# Patient Record
Sex: Male | Born: 1950
Health system: Southern US, Community
[De-identification: ages and names within clinical notes are randomized; demographics above are authoritative.]

## PROBLEM LIST (undated history)

## (undated) DIAGNOSIS — J302 Other seasonal allergic rhinitis: Secondary | ICD-10-CM

## (undated) DIAGNOSIS — E7800 Pure hypercholesterolemia, unspecified: Secondary | ICD-10-CM

## (undated) DIAGNOSIS — M1990 Unspecified osteoarthritis, unspecified site: Secondary | ICD-10-CM

## (undated) DIAGNOSIS — E785 Hyperlipidemia, unspecified: Secondary | ICD-10-CM

## (undated) DIAGNOSIS — R972 Elevated prostate specific antigen [PSA]: Secondary | ICD-10-CM

## (undated) DIAGNOSIS — H3320 Serous retinal detachment, unspecified eye: Secondary | ICD-10-CM

## (undated) DIAGNOSIS — C61 Malignant neoplasm of prostate: Secondary | ICD-10-CM

## (undated) HISTORY — DX: Serous retinal detachment, unspecified eye: H33.20

## (undated) HISTORY — DX: Hyperlipidemia, unspecified: E78.5

## (undated) HISTORY — PX: PROSTATE SURGERY: SHX751

## (undated) HISTORY — PX: TONSILLECTOMY: SUR1361

## (undated) HISTORY — PX: TOOTH EXTRACTION: SHX859

## (undated) HISTORY — DX: Elevated prostate specific antigen (PSA): R97.20

## (undated) HISTORY — PX: NASAL SEPTOPLASTY W/ TURBINOPLASTY: SHX2070

## (undated) HISTORY — PX: GANGLION CYST EXCISION: SHX1691

## (undated) HISTORY — PX: KNEE ARTHROSCOPY: SUR90

## (undated) HISTORY — PX: VASECTOMY: SHX75

---

## 2009-01-12 ENCOUNTER — Encounter (INDEPENDENT_AMBULATORY_CARE_PROVIDER_SITE_OTHER): Payer: Self-pay | Admitting: General Surgery

## 2009-01-12 ENCOUNTER — Ambulatory Visit (HOSPITAL_COMMUNITY): Admission: RE | Admit: 2009-01-12 | Discharge: 2009-01-12 | Payer: Self-pay | Admitting: General Surgery

## 2009-04-24 ENCOUNTER — Encounter: Admission: RE | Admit: 2009-04-24 | Discharge: 2009-05-29 | Payer: Self-pay | Admitting: Pediatrics

## 2011-02-26 NOTE — H&P (Signed)
NAME:  Dwayne Young, Dwayne Young               ACCOUNT NO.:  [id number]   MEDICAL RECORD NO.:  [id number]          PATIENT TYPE:  AMB   LOCATION:  DAY                           FACILITY:  [facility]   PHYSICIAN:  Dalia Heading, M.D.  DATE OF BIRTH:  [birth year]   DATE OF ADMISSION:  DATE OF DISCHARGE:  LH                              HISTORY & PHYSICAL   CHIEF COMPLAINT:  History of colon polyps, epigastric pain.   HISTORY OF PRESENT ILLNESS:  The patient is a 60 year old white male who  is referred for endoscopic evaluation.  He needs colonoscopy for  followup of colon polyps and an EGD for epigastric pain.  He last had  colonoscopy over 3 years ago in [place].  He states he has had  intermittent left upper quadrant abdominal pain, bloating for the last 6  months.  He was tried on a brand of antibiotics for H. pylori, but they  did not work.  No weight loss, nausea, vomiting, diarrhea, constipation,  melena, and hematochezia have been noted.  There is no family history of  colon carcinoma.   PAST MEDICAL HISTORY:  1. Hypertension.  2. Extrinsic allergies.  3. Bone pain.   PAST SURGICAL HISTORY:  1. Multiple knee surgeries.  2. Ganglion cyst removal on the wrist.  3. Colonoscopies.   CURRENT MEDICATIONS:  Baby aspirin, simvastatin, multivitamin,  hydrochlorothiazide, Zyrtec, and diclofenac.   ALLERGIES:  No known drug allergies.   REVIEW OF SYSTEMS:  Noncontributory.   PHYSICAL EXAMINATION:  GENERAL:  The patient is a well-developed, well-  nourished white male in no acute distress.  LUNGS:  Clear to auscultation with equal breath sounds bilaterally.  HEART:  Regular rate and rhythm without S3, S4, murmurs.  ABDOMEN:  Soft, nontender, nondistended.  No hepatosplenomegaly or  masses are noted.  RECTAL:  Deferred to the procedure.   IMPRESSION:  History of colon polyps, epigastric pain.   PLAN:  The patient is scheduled for an EGD and colonoscopy on January 12, 2009.  The risks and  benefits of the procedure including bleeding and  perforation were fully explained to the patient, gave informed consent.      Dalia Heading, M.D.  Electronically Signed     MAJ/MEDQ  D:  12/19/2008  T:  12/19/2008  Job:  [id number]   cc:   Francoise Schaumann. Milford Cage DO, FAAP  Fax: [phone]

## 2013-02-01 ENCOUNTER — Ambulatory Visit (INDEPENDENT_AMBULATORY_CARE_PROVIDER_SITE_OTHER): Payer: BC Managed Care – PPO | Admitting: Family Medicine

## 2013-02-01 ENCOUNTER — Encounter: Payer: Self-pay | Admitting: Family Medicine

## 2013-02-01 VITALS — BP 110/70 | HR 74 | Temp 98.3°F | Resp 16 | Wt 202.0 lb

## 2013-02-01 DIAGNOSIS — H3320 Serous retinal detachment, unspecified eye: Secondary | ICD-10-CM | POA: Insufficient documentation

## 2013-02-01 DIAGNOSIS — E785 Hyperlipidemia, unspecified: Secondary | ICD-10-CM | POA: Insufficient documentation

## 2013-02-01 DIAGNOSIS — R972 Elevated prostate specific antigen [PSA]: Secondary | ICD-10-CM | POA: Insufficient documentation

## 2013-02-01 LAB — HEPATIC FUNCTION PANEL
ALT: 15 U/L (ref 0–53)
AST: 20 U/L (ref 0–37)
Alkaline Phosphatase: 67 U/L (ref 39–117)
Bilirubin, Direct: 0.1 mg/dL (ref 0.0–0.3)
Total Bilirubin: 0.6 mg/dL (ref 0.3–1.2)

## 2013-02-01 LAB — LIPID PANEL
LDL Cholesterol: 101 mg/dL — ABNORMAL HIGH (ref 0–99)
Total CHOL/HDL Ratio: 3.6 Ratio
VLDL: 25 mg/dL (ref 0–40)

## 2013-02-01 LAB — BASIC METABOLIC PANEL
BUN: 26 mg/dL — ABNORMAL HIGH (ref 6–23)
Chloride: 106 mEq/L (ref 96–112)
Potassium: 4.8 mEq/L (ref 3.5–5.3)
Sodium: 141 mEq/L (ref 135–145)

## 2013-02-01 MED ORDER — LORAZEPAM 0.5 MG PO TABS: 0.5000 mg | ORAL_TABLET | Freq: Every evening | ORAL | Status: DC | PRN

## 2013-02-01 NOTE — Progress Notes (Signed)
  Subjective:    Patient ID: Dwayne Young, male    DOB: 1951/01/18, 62 y.o.   MRN: [id number]  HPI At last office visit the patient was found to have an increase in his PSA from 1.27-3.07, with a free PSA of 13. Urology was consulted and performed a biopsy although the results have not returned yet.  Patient's LDL cholesterol was found to be elevated at 145. We increased his pravastatin to 40 mg by mouth daily. He denies any myalgias, or right upper quadrant pain. He is here today to recheck his fasting lipid panel. He denies any side effects of medication. Of note he is traveling to [place] and within the week. We did discuss malaria prophylaxis which he declines at the present time. Past Medical History  Diagnosis Date  . Retinal detachment   . Hyperlipidemia   . Elevated PSA    Current outpatient prescriptions:aspirin 81 MG tablet, Take 81 mg by mouth daily., Disp: , Rfl: ;  Azelastine-Fluticasone (DYMISTA) 137-50 MCG/ACT SUSP, Place into the nose., Disp: , Rfl: ;  cetirizine (ZYRTEC) 10 MG tablet, Take 10 mg by mouth daily., Disp: , Rfl: ;  diclofenac (VOLTAREN) 75 MG EC tablet, Take 75 mg by mouth 2 (two) times daily., Disp: , Rfl: ;  montelukast (SINGULAIR) 10 MG tablet, Take 10 mg by mouth at bedtime., Disp: , Rfl:  pravastatin (PRAVACHOL) 20 MG tablet, Take 20 mg by mouth at bedtime., Disp: , Rfl: ;  tamsulosin (FLOMAX) 0.4 MG CAPS, Take 0.4 mg by mouth., Disp: , Rfl: ;  LORazepam (ATIVAN) 0.5 MG tablet, Take 1 tablet (0.5 mg total) by mouth at bedtime as needed., Disp: 30 tablet, Rfl: 1     Review of Systems    review of systems is otherwise negative Objective:   Physical Exam  Constitutional: He appears well-developed and well-nourished.  Cardiovascular: Normal rate, regular rhythm, normal heart sounds and intact distal pulses.   No murmur heard. Pulmonary/Chest: Effort normal and breath sounds normal. No respiratory distress. He has no wheezes. He has no rales.  Abdominal: Soft.  Bowel sounds are normal. He exhibits no distension. There is no tenderness. There is no rebound.          Assessment & Plan:  1. HLD (hyperlipidemia) Patient is tolerating the medicine fine at the higher dose. We will check a fasting lipid panel today. His goal LDL is less than 130.  If this is achieved we will continue pravastatin 40 mg by mouth daily - Basic Metabolic Panel - Hepatic Function Panel - Lipid Panel

## 2013-02-15 ENCOUNTER — Telehealth: Payer: Self-pay | Admitting: Family Medicine

## 2013-02-15 MED ORDER — PRAVASTATIN SODIUM 20 MG PO TABS: 40.0000 mg | ORAL_TABLET | Freq: Every day | ORAL | Status: DC

## 2013-02-15 NOTE — Telephone Encounter (Signed)
Rx Refilled  

## 2013-03-18 ENCOUNTER — Telehealth: Payer: Self-pay | Admitting: Family Medicine

## 2013-03-18 MED ORDER — PRAVASTATIN SODIUM 20 MG PO TABS: 40.0000 mg | ORAL_TABLET | Freq: Every day | ORAL | Status: DC

## 2013-03-18 NOTE — Telephone Encounter (Signed)
Medication refilled per protocol. 

## 2013-04-06 NOTE — H&P (Signed)
  NTS SOAP Note  Vital Signs:  Vitals as of: 03/11/2013: Systolic 145: Diastolic 80: Heart Rate 62: Temp 28F: Height 41ft 3in: Weight 204Lbs 0 Ounces: BMI 25.5  BMI : 25.5 kg/m2  Subjective: This 62 Years 29 Months old Male presents for follow up colonoscopy.  Last had a colonoscopy in 2010, polyp found.  No lower gi complaints.  No immediate family h/o colon cancer.  Recently diagnosed with prostate cancer.   Review of Symptoms:  Constitutional:unremarkable   Head:unremarkable    Eyes:unremarkable   Nose/Mouth/Throat:unremarkable Cardiovascular:  unremarkable   Respiratory:unremarkable   Gastrointestinal:  unremarkable   Genitourinary:unremarkable     Skin:unremarkable Hematolgic/Lymphatic:unremarkable     Allergic/Immunologic:unremarkable     Past Medical History:    Reviewed   Past Medical History  Surgical History: knee surgeries, ganglion cyst, TCS 2010 Medical Problems: prostate cancer, colon polyp, high cholesterol Allergies: nkda Medications: zyrtec, diclofenac, simvastatin   Social History:Reviewed  Social History  Preferred Language: English Race:  White Ethnicity: Not Hispanic / Latino Age: 62 Years 11 Months Marital Status:  M Alcohol: socially Recreational drug(s):  No   Smoking Status: Never smoker reviewed on 03/11/2013 Functional Status reviewed on mm/dd/yyyy ------------------------------------------------ Bathing: Normal Cooking: Normal Dressing: Normal Driving: Normal Eating: Normal Managing Meds: Normal Oral Care: Normal Shopping: Normal Toileting: Normal Transferring: Normal Walking: Normal Cognitive Status reviewed on mm/dd/yyyy ------------------------------------------------ Attention: Normal Decision Making: Normal Language: Normal Memory: Normal Motor: Normal Perception: Normal Problem Solving: Normal Visual and Spatial: Normal   Family History:  Reviewed  Family Health  History Mother, Unknown; Healthy;  Father, Unknown; Healthy;     Objective Information: General:  Well appearing, well nourished in no distress. Neck:  Supple without lymphadenopathy.  Heart:  RRR, no murmur Abdomen:Soft, NT/ND, no HSM, no masses.   deferred to procedure  Assessment:Colon polyp  Diagnosis &amp; Procedure Smart Code   Plan:Scheduled for TCS on 04/27/13.   Patient Education:Alternative treatments to surgery were discussed with patient (and family).  Risks and benefits  of procedure were fully explained to the patient (and family) who gave informed consent. Patient/family questions were addressed.  Follow-up:Pending Test Results

## 2013-04-19 ENCOUNTER — Encounter (HOSPITAL_COMMUNITY): Payer: Self-pay | Admitting: Pharmacy Technician

## 2013-04-23 ENCOUNTER — Other Ambulatory Visit: Payer: Self-pay | Admitting: Physician Assistant

## 2013-04-23 NOTE — Telephone Encounter (Signed)
Medication refilled per protocol. 

## 2013-04-27 ENCOUNTER — Ambulatory Visit (HOSPITAL_COMMUNITY)
Admission: RE | Admit: 2013-04-27 | Discharge: 2013-04-27 | Disposition: A | Discharge status: Home / Self Care | Payer: BC Managed Care – PPO | Source: Ambulatory Visit | Attending: General Surgery | Admitting: General Surgery

## 2013-04-27 ENCOUNTER — Encounter (HOSPITAL_COMMUNITY): Payer: Self-pay

## 2013-04-27 ENCOUNTER — Encounter (HOSPITAL_COMMUNITY)
Admission: RE | Disposition: A | Discharge status: Home / Self Care | Payer: Self-pay | Source: Ambulatory Visit | Attending: General Surgery

## 2013-04-27 DIAGNOSIS — Z09 Encounter for follow-up examination after completed treatment for conditions other than malignant neoplasm: Secondary | ICD-10-CM | POA: Insufficient documentation

## 2013-04-27 DIAGNOSIS — Z860100 Personal history of colon polyps, unspecified: Secondary | ICD-10-CM | POA: Insufficient documentation

## 2013-04-27 HISTORY — PX: COLONOSCOPY: SHX5424

## 2013-04-27 HISTORY — DX: Unspecified osteoarthritis, unspecified site: M19.90

## 2013-04-27 HISTORY — DX: Pure hypercholesterolemia, unspecified: E78.00

## 2013-04-27 HISTORY — DX: Malignant neoplasm of prostate: C61

## 2013-04-27 HISTORY — DX: Other seasonal allergic rhinitis: J30.2

## 2013-04-27 SURGERY — COLONOSCOPY
Anesthesia: Moderate Sedation

## 2013-04-27 MED ORDER — ATROPINE SULFATE 1 MG/ML IJ SOLN
INTRAMUSCULAR | Status: AC
  Filled 2013-04-27: qty 1

## 2013-04-27 MED ORDER — ATROPINE SULFATE 0.1 MG/ML IJ SOLN
INTRAMUSCULAR | Status: DC | PRN
  Administered 2013-04-27: 0.5 mg via INTRAVENOUS

## 2013-04-27 MED ORDER — MEPERIDINE HCL 50 MG/ML IJ SOLN
INTRAMUSCULAR | Status: AC
  Filled 2013-04-27: qty 1

## 2013-04-27 MED ORDER — FENTANYL CITRATE 0.05 MG/ML IJ SOLN
INTRAMUSCULAR | Status: AC
  Filled 2013-04-27: qty 2

## 2013-04-27 MED ORDER — MIDAZOLAM HCL 5 MG/5ML IJ SOLN
INTRAMUSCULAR | Status: DC | PRN
  Administered 2013-04-27: 2 mg via INTRAVENOUS
  Administered 2013-04-27: 1 mg via INTRAVENOUS

## 2013-04-27 MED ORDER — STERILE WATER FOR IRRIGATION IR SOLN
Status: DC | PRN
  Administered 2013-04-27: 08:00:00

## 2013-04-27 MED ORDER — FENTANYL CITRATE 0.05 MG/ML IJ SOLN
INTRAMUSCULAR | Status: DC | PRN
  Administered 2013-04-27: 50 ug via INTRAVENOUS

## 2013-04-27 MED ORDER — SODIUM CHLORIDE 0.9 % IV SOLN
INTRAVENOUS | Status: DC
  Administered 2013-04-27: 08:00:00 via INTRAVENOUS

## 2013-04-27 MED ORDER — MIDAZOLAM HCL 5 MG/5ML IJ SOLN
INTRAMUSCULAR | Status: AC
  Filled 2013-04-27: qty 10

## 2013-04-27 NOTE — Op Note (Addendum)
[facility] [address] [place], [address]   COLONOSCOPY PROCEDURE REPORT  PATIENT: Dwayne Young, Dwayne Young  MR#: [id number] BIRTHDATE: 1951-09-22 , 62  yrs. old GENDER: Male ENDOSCOPIST: Franky Macho, MD REFERRED [id number], Broadus John PROCEDURE DATE:  04/27/2013 PROCEDURE:   Colonoscopy, surveillance ASA CLASS:   Class II INDICATIONS:Patient's personal history of colon polyps. MEDICATIONS: Versed 4 mg IV, Fentanyl 50 mcg IV, and Atropine .5 mg IV  DESCRIPTION OF PROCEDURE:   After the risks benefits and alternatives of the procedure were thoroughly explained, informed consent was obtained.  A digital rectal exam revealed no abnormalities of the rectum.   The EC-[id number] ([id number])  endoscope was introduced through the anus and advanced to the cecum, which was identified by both the appendix and ileocecal valve. No adverse events experienced.   The quality of the prep was adequate, using MoviPrep  The instrument was then slowly withdrawn as the colon was fully examined.      COLON FINDINGS: A normal appearing cecum, ileocecal valve, and appendiceal orifice were identified.  The ascending, hepatic flexure, transverse, splenic flexure, descending, sigmoid colon and rectum appeared unremarkable.  During procedure patient had breif episode of bradycardia to 35, treated with atropine.  No polyps or cancers were seen.  Retroflexed views revealed no abnormalities. The time to cecum=5 minutes 0 seconds.  Withdrawal time=3 minutes 0 seconds.  The scope was withdrawn and the procedure completed. COMPLICATIONS: There were no complications.  ENDOSCOPIC IMPRESSION: Normal colon  RECOMMENDATIONS: Repeat Colonscopy in 10 years.   eSigned:  Franky Macho, MD 04/27/2013 8:49 AM   cc:

## 2013-04-27 NOTE — Interval H&P Note (Signed)
History and Physical Interval Note:  04/27/2013 8:45 AM  Dwayne Young  has presented today for surgery, with the diagnosis of colon polyps  The various methods of treatment have been discussed with the patient and family. After consideration of risks, benefits and other options for treatment, the patient has consented to  Procedure(s): COLONOSCOPY (N/A) as a surgical intervention .  The patient's history has been reviewed, patient examined, no change in status, stable for surgery.  I have reviewed the patient's chart and labs.  Questions were answered to the patient's satisfaction.     Franky Macho A

## 2013-04-29 ENCOUNTER — Encounter (HOSPITAL_COMMUNITY): Payer: Self-pay | Admitting: General Surgery

## 2013-05-03 ENCOUNTER — Ambulatory Visit (INDEPENDENT_AMBULATORY_CARE_PROVIDER_SITE_OTHER): Payer: BC Managed Care – PPO | Admitting: Family Medicine

## 2013-05-03 ENCOUNTER — Encounter: Payer: Self-pay | Admitting: Family Medicine

## 2013-05-03 VITALS — BP 126/74 | HR 60 | Temp 98.1°F | Resp 14 | Wt 202.0 lb

## 2013-05-03 DIAGNOSIS — R972 Elevated prostate specific antigen [PSA]: Secondary | ICD-10-CM

## 2013-05-03 DIAGNOSIS — E785 Hyperlipidemia, unspecified: Secondary | ICD-10-CM

## 2013-05-03 MED ORDER — ZOSTER VACCINE LIVE 19400 UNT/0.65ML ~~LOC~~ SOLR: 0.6500 mL | Freq: Once | SUBCUTANEOUS | Status: DC

## 2013-05-03 NOTE — Progress Notes (Signed)
Subjective:    Patient ID: Dwayne Young, male    DOB: 14-Dec-1950, 62 y.o.   MRN: [id number]  HPI 02/01/13 At last office visit the patient was found to have an increase in his PSA from 1.27-3.07, with a free PSA of 13. Urology was consulted and performed a biopsy although the results have not returned yet.  Patient's LDL cholesterol was found to be elevated at 145. We increased his pravastatin to 40 mg by mouth daily. He denies any myalgias, or right upper quadrant pain. He is here today to recheck his fasting lipid panel. He denies any side effects of medication. Of note he is traveling to [place] and within the week. We did discuss malaria prophylaxis which he declines at the present time.  At that time, my plan was: 1. HLD (hyperlipidemia) Patient is tolerating the medicine fine at the higher dose. We will check a fasting lipid panel today. His goal LDL is less than 130.  If this is achieved we will continue pravastatin 40 mg by mouth daily - Basic Metabolic Panel - Hepatic Function Panel - Lipid Panel  05/03/13 Biopsy of the patient's prostate did reveal Gleason 6 prostate cancer. Present time he has elected to pursue active surveillance.  He is due for his 6 month PSA today.  He also has a history of hyperlipidemia. His most recent cholesterol is below: No visits with results within 3 Month(s) from this visit. Latest known visit with results is:  Office Visit on 02/01/2013  Component Date Value Range Status  . Sodium 02/01/2013 141  135 - 145 mEq/L Final  . Potassium 02/01/2013 4.8  3.5 - 5.3 mEq/L Final  . Chloride 02/01/2013 106  96 - 112 mEq/L Final  . CO2 02/01/2013 29  19 - 32 mEq/L Final  . Glucose, Bld 02/01/2013 91  70 - 99 mg/dL Final  . BUN 40/98/1191 26* 6 - 23 mg/dL Final  . Creat 47/82/9562 1.17  0.50 - 1.35 mg/dL Final  . Calcium 13/05/6577 9.5  8.4 - 10.5 mg/dL Final  . Total Bilirubin 02/01/2013 0.6  0.3 - 1.2 mg/dL Final  . Bilirubin, Direct 02/01/2013 0.1  0.0 - 0.3  mg/dL Final  . Indirect Bilirubin 02/01/2013 0.5  0.0 - 0.9 mg/dL Final  . Alkaline Phosphatase 02/01/2013 67  39 - 117 U/L Final  . AST 02/01/2013 20  0 - 37 U/L Final  . ALT 02/01/2013 15  0 - 53 U/L Final  . Total Protein 02/01/2013 6.7  6.0 - 8.3 g/dL Final  . Albumin 46/96/2952 4.2  3.5 - 5.2 g/dL Final  . Cholesterol 84/13/2440 175  0 - 200 mg/dL Final   Comment: ATP III Classification:                                < 200        mg/dL        Desirable                               200 - 239     mg/dL        Borderline High                               >= 240        mg/dL  High                             . Triglycerides 02/01/2013 126  <150 mg/dL Final  . HDL 16/07/9603 49  >39 mg/dL Final  . Total CHOL/HDL Ratio 02/01/2013 3.6   Final  . VLDL 02/01/2013 25  0 - 40 mg/dL Final  . LDL Cholesterol 02/01/2013 101* 0 - 99 mg/dL Final   Comment:                            Total Cholesterol/HDL Ratio:CHD Risk                                                 Coronary Heart Disease Risk Table                                                                 Men       Women                                   1/2 Average Risk              3.4        3.3                                       Average Risk              5.0        4.4                                    2X Average Risk              9.6        7.1                                    3X Average Risk             23.4       11.0                          Use the calculated Patient Ratio above and the CHD Risk table                           to determine the patient's CHD Risk.                          ATP III Classification (LDL):                                <  100        mg/dL         Optimal                               100 - 129     mg/dL         Near or Above Optimal                               130 - 159     mg/dL         Borderline High                               160 - 189     mg/dL         High                                 > 190        mg/dL         Very High                              He continues to take pravastatin 20 mg by mouth daily. He denies myalgias or right upper quadrant pain  Past Medical History  Diagnosis Date  . Retinal detachment   . Hyperlipidemia   . Elevated PSA   . Hypercholesteremia   . Seasonal allergies   . Prostate cancer   . Arthritis    Current outpatient prescriptions:aspirin 81 MG tablet, Take 81 mg by mouth daily., Disp: , Rfl: ;  Azelastine-Fluticasone (DYMISTA) 137-50 MCG/ACT SUSP, Place into the nose., Disp: , Rfl: ;  cetirizine (ZYRTEC) 10 MG tablet, Take 10 mg by mouth daily., Disp: , Rfl: ;  diclofenac (VOLTAREN) 75 MG EC tablet, TAKE 1 TABLET EVERY DAY WITH FOOD, Disp: 30 tablet, Rfl: 5;  montelukast (SINGULAIR) 10 MG tablet, Take 10 mg by mouth at bedtime., Disp: , Rfl:  Multiple Vitamin (MULTIVITAMIN) tablet, Take 1 tablet by mouth daily., Disp: , Rfl: ;  pravastatin (PRAVACHOL) 20 MG tablet, Take 2 tablets (40 mg total) by mouth at bedtime., Disp: 60 tablet, Rfl: 5;  RESVERATROL PO, Take 1 tablet by mouth daily., Disp: , Rfl: ;  tamsulosin (FLOMAX) 0.4 MG CAPS, Take 0.4 mg by mouth., Disp: , Rfl: ;  vitamin C (ASCORBIC ACID) 500 MG tablet, Take 500 mg by mouth daily., Disp: , Rfl:      Review of Systems     review of systems is otherwise negative Objective:   Physical Exam  Constitutional: He appears well-developed and well-nourished.  Cardiovascular: Normal rate, regular rhythm, normal heart sounds and intact distal pulses.   No murmur heard. Pulmonary/Chest: Effort normal and breath sounds normal. No respiratory distress. He has no wheezes. He has no rales.  Abdominal: Soft. Bowel sounds are normal. He exhibits no distension. There is no tenderness. There is no rebound.          Assessment & Plan:  1. HLD (hyperlipidemia) Check fasting lipid panel. Goal LDL is less than 130. - COMPLETE METABOLIC PANEL WITH GFR; Future - Lipid panel;  Future  2. Elevated PSA Repeat PSA. Last PSA was 3. If there  is substantial increase in the PSA the patient may elect to pursue a more active management strategy. - PSA; Future

## 2013-05-05 ENCOUNTER — Other Ambulatory Visit: Payer: BC Managed Care – PPO

## 2013-05-05 DIAGNOSIS — R972 Elevated prostate specific antigen [PSA]: Secondary | ICD-10-CM

## 2013-05-05 DIAGNOSIS — E785 Hyperlipidemia, unspecified: Secondary | ICD-10-CM

## 2013-05-06 LAB — LIPID PANEL
LDL Cholesterol: 104 mg/dL — ABNORMAL HIGH (ref 0–99)
Total CHOL/HDL Ratio: 3.9 Ratio
Triglycerides: 133 mg/dL (ref <150)
VLDL: 27 mg/dL (ref 0–40)

## 2013-05-06 LAB — COMPLETE METABOLIC PANEL WITH GFR
ALT: 14 U/L (ref 0–53)
AST: 15 U/L (ref 0–37)
Albumin: 4.1 g/dL (ref 3.5–5.2)
Alkaline Phosphatase: 65 U/L (ref 39–117)
Calcium: 9.1 mg/dL (ref 8.4–10.5)
Chloride: 105 mEq/L (ref 96–112)
Potassium: 4.3 mEq/L (ref 3.5–5.3)
Sodium: 141 mEq/L (ref 135–145)

## 2013-05-24 ENCOUNTER — Other Ambulatory Visit: Payer: Self-pay | Admitting: Urology

## 2013-05-24 DIAGNOSIS — C61 Malignant neoplasm of prostate: Secondary | ICD-10-CM

## 2013-06-16 ENCOUNTER — Ambulatory Visit (HOSPITAL_COMMUNITY)
Admission: RE | Admit: 2013-06-16 | Discharge: 2013-06-16 | Disposition: A | Discharge status: Home / Self Care | Payer: BC Managed Care – PPO | Source: Ambulatory Visit | Attending: Urology | Admitting: Urology

## 2013-06-16 DIAGNOSIS — C61 Malignant neoplasm of prostate: Secondary | ICD-10-CM

## 2013-06-16 LAB — CREATININE, SERUM: Creatinine, Ser: 1.12 mg/dL (ref 0.50–1.35)

## 2013-06-16 MED ORDER — GADOBENATE DIMEGLUMINE 529 MG/ML IV SOLN
20.0000 mL | Freq: Once | INTRAVENOUS | Status: AC | PRN
  Administered 2013-06-16: 18 mL via INTRAVENOUS

## 2013-06-22 ENCOUNTER — Ambulatory Visit (INDEPENDENT_AMBULATORY_CARE_PROVIDER_SITE_OTHER): Payer: BC Managed Care – PPO | Admitting: Urology

## 2013-06-22 DIAGNOSIS — C61 Malignant neoplasm of prostate: Secondary | ICD-10-CM

## 2013-08-19 ENCOUNTER — Other Ambulatory Visit: Payer: Self-pay

## 2013-08-27 ENCOUNTER — Other Ambulatory Visit: Payer: Self-pay | Admitting: Urology

## 2013-08-27 DIAGNOSIS — C61 Malignant neoplasm of prostate: Secondary | ICD-10-CM

## 2013-10-13 ENCOUNTER — Other Ambulatory Visit: Payer: BC Managed Care – PPO

## 2013-10-13 DIAGNOSIS — R972 Elevated prostate specific antigen [PSA]: Secondary | ICD-10-CM

## 2013-10-13 DIAGNOSIS — E785 Hyperlipidemia, unspecified: Secondary | ICD-10-CM

## 2013-10-13 DIAGNOSIS — Z79899 Other long term (current) drug therapy: Secondary | ICD-10-CM

## 2013-10-13 LAB — CBC WITH DIFFERENTIAL/PLATELET
Basophils Absolute: 0 10*3/uL (ref 0.0–0.1)
Basophils Relative: 0 % (ref 0–1)
Eosinophils Absolute: 0.2 10*3/uL (ref 0.0–0.7)
Eosinophils Relative: 3 % (ref 0–5)
Lymphs Abs: 2.3 10*3/uL (ref 0.7–4.0)
MCH: 31.1 pg (ref 26.0–34.0)
MCHC: 33.4 g/dL (ref 30.0–36.0)
MCV: 93.1 fL (ref 78.0–100.0)
Platelets: 232 10*3/uL (ref 150–400)
RDW: 13.6 % (ref 11.5–15.5)

## 2013-10-13 LAB — LIPID PANEL
Cholesterol: 182 mg/dL (ref 0–200)
Total CHOL/HDL Ratio: 3.7 Ratio
VLDL: 32 mg/dL (ref 0–40)

## 2013-10-13 LAB — COMPREHENSIVE METABOLIC PANEL
ALT: 17 U/L (ref 0–53)
CO2: 28 mEq/L (ref 19–32)
Creat: 1.15 mg/dL (ref 0.50–1.35)
Total Bilirubin: 0.5 mg/dL (ref 0.3–1.2)

## 2013-10-19 ENCOUNTER — Ambulatory Visit (INDEPENDENT_AMBULATORY_CARE_PROVIDER_SITE_OTHER): Payer: BC Managed Care – PPO | Admitting: Family Medicine

## 2013-10-19 ENCOUNTER — Encounter: Payer: Self-pay | Admitting: Family Medicine

## 2013-10-19 ENCOUNTER — Other Ambulatory Visit (HOSPITAL_COMMUNITY): Payer: BC Managed Care – PPO

## 2013-10-19 VITALS — BP 110/78 | HR 81 | Temp 97.0°F | Resp 18 | Wt 208.0 lb

## 2013-10-19 DIAGNOSIS — E785 Hyperlipidemia, unspecified: Secondary | ICD-10-CM

## 2013-10-19 DIAGNOSIS — B070 Plantar wart: Secondary | ICD-10-CM

## 2013-10-19 NOTE — Progress Notes (Signed)
 Subjective:    Patient ID: Dwayne Young, male    DOB: 08/31/1951, 63 y.o.   MRN: [id number]  HPI Patient is here for followup of his hyperlipidemia.  He is currently taking Pravachol 20 mg by mouth daily. He denies any myalgias or right upper quadrant pain. He is tolerating medication well. His most recent fasting lipid panel is shown below: Lab on 10/13/2013  Component Date Value Range Status  . WBC 10/13/2013 6.3  4.0 - 10.5 K/uL Final  . RBC 10/13/2013 4.79  4.22 - 5.81 MIL/uL Final  . Hemoglobin 10/13/2013 14.9  13.0 - 17.0 g/dL Final  . HCT 10/13/2013 44.6  39.0 - 52.0 % Final  . MCV 10/13/2013 93.1  78.0 - 100.0 fL Final  . MCH 10/13/2013 31.1  26.0 - 34.0 pg Final  . MCHC 10/13/2013 33.4  30.0 - 36.0 g/dL Final  . RDW 10/13/2013 13.6  11.5 - 15.5 % Final  . Platelets 10/13/2013 232  150 - 400 K/uL Final  . Neutrophils Relative % 10/13/2013 54  43 - 77 % Final  . Neutro Abs 10/13/2013 3.3  1.7 - 7.7 K/uL Final  . Lymphocytes Relative 10/13/2013 36  12 - 46 % Final  . Lymphs Abs 10/13/2013 2.3  0.7 - 4.0 K/uL Final  . Monocytes Relative 10/13/2013 7  3 - 12 % Final  . Monocytes Absolute 10/13/2013 0.5  0.1 - 1.0 K/uL Final  . Eosinophils Relative 10/13/2013 3  0 - 5 % Final  . Eosinophils Absolute 10/13/2013 0.2  0.0 - 0.7 K/uL Final  . Basophils Relative 10/13/2013 0  0 - 1 % Final  . Basophils Absolute 10/13/2013 0.0  0.0 - 0.1 K/uL Final  . Smear Review 10/13/2013 Criteria for review not met   Final  . Sodium 10/13/2013 141  135 - 145 mEq/L Final  . Potassium 10/13/2013 4.5  3.5 - 5.3 mEq/L Final  . Chloride 10/13/2013 104  96 - 112 mEq/L Final  . CO2 10/13/2013 28  19 - 32 mEq/L Final  . Glucose, Bld 10/13/2013 90  70 - 99 mg/dL Final  . BUN 10/13/2013 17  6 - 23 mg/dL Final  . Creat 10/13/2013 1.15  0.50 - 1.35 mg/dL Final  . Total Bilirubin 10/13/2013 0.5  0.3 - 1.2 mg/dL Final  . Alkaline Phosphatase 10/13/2013 65  39 - 117 U/L Final  . AST 10/13/2013 20  0 - 37  U/L Final  . ALT 10/13/2013 17  0 - 53 U/L Final  . Total Protein 10/13/2013 6.6  6.0 - 8.3 g/dL Final  . Albumin 10/13/2013 4.1  3.5 - 5.2 g/dL Final  . Calcium 10/13/2013 9.2  8.4 - 10.5 mg/dL Final  . Cholesterol 10/13/2013 182  0 - 200 mg/dL Final   Comment: ATP III Classification:                                < 200        mg/dL        Desirable                               200 - 239     mg/dL        Borderline High                               >=   240        mg/dL        High                             . Triglycerides 10/13/2013 159* <150 mg/dL Final  . HDL 10/13/2013 49  >39 mg/dL Final  . Total CHOL/HDL Ratio 10/13/2013 3.7   Final  . VLDL 10/13/2013 32  0 - 40 mg/dL Final  . LDL Cholesterol 10/13/2013 101* 0 - 99 mg/dL Final   Comment:                            Total Cholesterol/HDL Ratio:CHD Risk                                                 Coronary Heart Disease Risk Table                                                                 Men       Women                                   1/2 Average Risk              3.4        3.3                                       Average Risk              5.0        4.4                                    2X Average Risk              9.6        7.1                                    3X Average Risk             23.4       11.0                          Use the calculated Patient Ratio above and the CHD Risk table                           to determine the patient's CHD Risk.                          ATP III Classification (LDL):                                <   100        mg/dL         Optimal                               100 - 129     mg/dL         Near or Above Optimal                               130 - 159     mg/dL         Borderline High                               160 - 189     mg/dL         High                                > 190        mg/dL         Very High                             . PSA 10/13/2013 2.74  <=4.00  ng/mL Final   Comment: Test Methodology: ECLIA PSA (Electrochemiluminescence Immunoassay)                                                     For PSA values from 2.5-4.0, particularly in younger men <60 years                          old, the AUA and NCCN suggest testing for % Free PSA (3515) and                          evaluation of the rate of increase in PSA (PSA velocity).   he also reports a plantars wart on the left foot lateral margin underneath the fifth metatarsal head.  Past Medical History  Diagnosis Date  . Retinal detachment   . Hyperlipidemia   . Elevated PSA   . Hypercholesteremia   . Seasonal allergies   . Prostate cancer   . Arthritis    Current Outpatient Prescriptions on File Prior to Visit  Medication Sig Dispense Refill  . aspirin 81 MG tablet Take 81 mg by mouth daily.      . Azelastine-Fluticasone (DYMISTA) 137-50 MCG/ACT SUSP Place into the nose.      . cetirizine (ZYRTEC) 10 MG tablet Take 10 mg by mouth daily.      . diclofenac (VOLTAREN) 75 MG EC tablet TAKE 1 TABLET EVERY DAY WITH FOOD  30 tablet  5  . Multiple Vitamin (MULTIVITAMIN) tablet Take 1 tablet by mouth daily.      . pravastatin (PRAVACHOL) 20 MG tablet Take 2 tablets (40 mg total) by mouth at bedtime.  60 tablet  5  . RESVERATROL PO Take 1 tablet by mouth daily.      . tamsulosin (FLOMAX) 0.4 MG CAPS Take 0.4 mg by  mouth.      . vitamin C (ASCORBIC ACID) 500 MG tablet Take 500 mg by mouth daily.      . montelukast (SINGULAIR) 10 MG tablet Take 10 mg by mouth at bedtime.       No current facility-administered medications on file prior to visit.   Allergies  Allergen Reactions  . Demerol [Meperidine]    History   Social History  . Marital Status: Married    Spouse Name: N/A    Number of Children: N/A  . Years of Education: N/A   Occupational History  . Not on file.   Social History Main Topics  . Smoking status: Former Smoker -- 0.50 packs/day for 15 years    Types: Cigarettes     Quit date: 04/27/1989  . Smokeless tobacco: Not on file  . Alcohol Use: Not on file     Comment: moderate  . Drug Use: No  . Sexual Activity: Yes    Birth Control/ Protection: None   Other Topics Concern  . Not on file   Social History Narrative  . No narrative on file     Review of Systems  All other systems reviewed and are negative.       Objective:   Physical Exam  Vitals reviewed. Neck: Neck supple. No JVD present. No thyromegaly present.  Cardiovascular: Normal rate, regular rhythm and normal heart sounds.  Exam reveals no gallop and no friction rub.   No murmur heard. Pulmonary/Chest: Effort normal and breath sounds normal. No respiratory distress. He has no wheezes. He has no rales. He exhibits no tenderness.  Abdominal: Soft. Bowel sounds are normal. He exhibits no distension and no mass. There is no tenderness. There is no rebound and no guarding.  Musculoskeletal: He exhibits no edema.  Lymphadenopathy:    He has no cervical adenopathy.   5 mm plantar wart on the left foot near the fifth metatarsal head        Assessment & Plan:  1. HLD (hyperlipidemia) Well controlled. Continue pravastatin 20 mg by mouth daily. Recheck in 6 months. 2. Plantar wart of left foot I debrided thick hyperkeratotic tissue above the plantars wart using a razor blade.  Cryotherapy was then applied to the lesion for 30 seconds using liquid nitrogen.  Wound care was discussed. In one week I want him to begin applying topical salicylic acid every night for a month. During the day I recommended he apply a small piece of duct tape. Anticipate gradual resolution of the next month.  

## 2013-10-27 ENCOUNTER — Other Ambulatory Visit: Payer: Self-pay | Admitting: Family Medicine

## 2013-10-27 MED ORDER — TAMSULOSIN HCL 0.4 MG PO CAPS: 0.4000 mg | ORAL_CAPSULE | Freq: Every day | ORAL | Status: DC

## 2013-11-21 ENCOUNTER — Other Ambulatory Visit: Payer: Self-pay | Admitting: Physician Assistant

## 2013-11-22 NOTE — Telephone Encounter (Signed)
Medication refilled per protocol. 

## 2014-01-04 ENCOUNTER — Ambulatory Visit (INDEPENDENT_AMBULATORY_CARE_PROVIDER_SITE_OTHER): Payer: BC Managed Care – PPO | Admitting: Urology

## 2014-01-04 DIAGNOSIS — C61 Malignant neoplasm of prostate: Secondary | ICD-10-CM

## 2014-01-27 ENCOUNTER — Other Ambulatory Visit: Payer: Self-pay | Admitting: Urology

## 2014-02-09 ENCOUNTER — Encounter (HOSPITAL_COMMUNITY): Payer: Self-pay | Admitting: Pharmacy Technician

## 2014-02-09 ENCOUNTER — Other Ambulatory Visit (HOSPITAL_COMMUNITY): Payer: Self-pay | Admitting: Urology

## 2014-02-09 DIAGNOSIS — C61 Malignant neoplasm of prostate: Secondary | ICD-10-CM

## 2014-02-14 ENCOUNTER — Encounter (HOSPITAL_COMMUNITY)
Admission: RE | Admit: 2014-02-14 | Discharge: 2014-02-14 | Disposition: A | Discharge status: Home / Self Care | Payer: BC Managed Care – PPO | Source: Ambulatory Visit | Attending: Urology | Admitting: Urology

## 2014-02-14 ENCOUNTER — Ambulatory Visit (HOSPITAL_COMMUNITY)
Admission: RE | Admit: 2014-02-14 | Discharge: 2014-02-14 | Disposition: A | Discharge status: Home / Self Care | Payer: BC Managed Care – PPO | Source: Ambulatory Visit | Attending: Urology | Admitting: Urology

## 2014-02-14 ENCOUNTER — Encounter (HOSPITAL_COMMUNITY): Payer: Self-pay

## 2014-02-14 DIAGNOSIS — Z01810 Encounter for preprocedural cardiovascular examination: Secondary | ICD-10-CM | POA: Insufficient documentation

## 2014-02-14 DIAGNOSIS — Z01818 Encounter for other preprocedural examination: Secondary | ICD-10-CM | POA: Insufficient documentation

## 2014-02-14 DIAGNOSIS — Z01812 Encounter for preprocedural laboratory examination: Secondary | ICD-10-CM | POA: Insufficient documentation

## 2014-02-14 LAB — BASIC METABOLIC PANEL
BUN: 19 mg/dL (ref 6–23)
CHLORIDE: 102 meq/L (ref 96–112)
CO2: 27 meq/L (ref 19–32)
CREATININE: 1.05 mg/dL (ref 0.50–1.35)
Calcium: 9.1 mg/dL (ref 8.4–10.5)
GFR calc non Af Amer: 74 mL/min — ABNORMAL LOW (ref >90)
GFR, EST AFRICAN AMERICAN: 85 mL/min — AB (ref >90)
Glucose, Bld: 97 mg/dL (ref 70–99)
POTASSIUM: 4.4 meq/L (ref 3.7–5.3)
Sodium: 138 mEq/L (ref 137–147)

## 2014-02-14 LAB — CBC
HEMATOCRIT: 43.4 % (ref 39.0–52.0)
HEMOGLOBIN: 14.8 g/dL (ref 13.0–17.0)
MCH: 31.4 pg (ref 26.0–34.0)
MCHC: 34.1 g/dL (ref 30.0–36.0)
MCV: 92.1 fL (ref 78.0–100.0)
Platelets: 235 10*3/uL (ref 150–400)
RBC: 4.71 MIL/uL (ref 4.22–5.81)
RDW: 13.1 % (ref 11.5–15.5)
WBC: 5.2 10*3/uL (ref 4.0–10.5)

## 2014-02-14 NOTE — Patient Instructions (Addendum)
Dwayne Young  02/14/2014   Your procedure is scheduled on:  03-03-2014  Report to [facility] at       [place] AM .  Call this number if you have problems the morning of surgery: [phone]  Or Presurgical Testing [phone](Dwayne Young) For Living Will and/or Health Care Power Attorney Forms: please provide copy for your medical record,may bring AM of surgery(Forms should be already notarized -we do not provide this service).(02-14-14 stated will bring AM of 03-03-14)  Remember: Follow any bowel prep instructions per MD office.    Do not eat food:After Midnight.    Take these medicines the morning of surgery with A SIP OF WATER: Fexofenadine. Pravastatin. Nasonex  Spray.   Do not wear jewelry, make-up or nail polish.  Do not wear lotions, powders, or perfumes. You may wear deodorant.  Do not shave 48 hours(2 days) prior to first CHG shower(legs and under arms).(Shaving face and neck okay.)  Do not bring valuables to the hospital.(Hospital is not responsible for lost valuables).  Contacts, dentures or removable bridgework, body piercing, hair pins may not be worn into surgery.  Leave suitcase in the car. After surgery it may be brought to your room.  For patients admitted to the hospital, checkout time is 11:00 AM the day of discharge.(Restricted visitors-Any Persons displaying flu-like symptoms or illness).    Patients discharged the day of surgery will not be allowed to drive home. Must have responsible person with you x 24 hours once discharged.  Name and phone number of your driver: [name]- spouse [phone]  Special Instructions: CHG(Chlorhedine 4%-"Hibiclens","Betasept","Aplicare") Shower Use Special Wash: see special instructions.(avoid face and genitals)   Please read over the following fact sheets that you were given: MRSA Information, Blood Transfusion fact sheet, Incentive Spirometry Instruction.  Remember : Type/Screen "Blue armbands" - may not be removed  once applied(would result in being retested AM of surgery, if removed).  Failure to follow these instructions may result in Cancellation of your surgery.   Patient signature_______________________________________________________

## 2014-02-15 ENCOUNTER — Encounter (HOSPITAL_COMMUNITY)
Admission: RE | Admit: 2014-02-15 | Discharge: 2014-02-15 | Disposition: A | Discharge status: Home / Self Care | Payer: BC Managed Care – PPO | Source: Ambulatory Visit | Attending: Urology | Admitting: Urology

## 2014-02-15 ENCOUNTER — Ambulatory Visit (HOSPITAL_COMMUNITY)
Admission: RE | Admit: 2014-02-15 | Discharge: 2014-02-15 | Disposition: A | Discharge status: Home / Self Care | Payer: BC Managed Care – PPO | Source: Ambulatory Visit | Attending: Urology | Admitting: Urology

## 2014-02-15 ENCOUNTER — Ambulatory Visit (HOSPITAL_COMMUNITY): Payer: BC Managed Care – PPO

## 2014-02-15 ENCOUNTER — Encounter (HOSPITAL_COMMUNITY): Payer: BC Managed Care – PPO

## 2014-02-15 DIAGNOSIS — C61 Malignant neoplasm of prostate: Secondary | ICD-10-CM | POA: Insufficient documentation

## 2014-02-15 MED ORDER — TECHNETIUM TC 99M MEDRONATE IV KIT
25.6000 | PACK | Freq: Once | INTRAVENOUS | Status: AC | PRN
  Administered 2014-02-15: 25.6 via INTRAVENOUS

## 2014-03-02 NOTE — Anesthesia Preprocedure Evaluation (Addendum)
Anesthesia Evaluation  Patient identified by MRN, date of birth, ID band Patient awake    Reviewed: Allergy & Precautions, H&P , NPO status , Patient's Chart, lab work & pertinent test results  Airway Mallampati: II TM Distance: >3 FB Neck ROM: Full    Dental no notable dental hx.    Pulmonary neg pulmonary ROS, former smoker,  breath sounds clear to auscultation  Pulmonary exam normal       Cardiovascular negative cardio ROS  Rhythm:Regular Rate:Normal     Neuro/Psych negative neurological ROS  negative psych ROS   GI/Hepatic negative GI ROS, Neg liver ROS,   Endo/Other  negative endocrine ROS  Renal/GU negative Renal ROS  negative genitourinary   Musculoskeletal negative musculoskeletal ROS (+)   Abdominal   Peds negative pediatric ROS (+)  Hematology negative hematology ROS (+)   Anesthesia Other Findings   Reproductive/Obstetrics negative OB ROS                          Anesthesia Physical Anesthesia Plan  ASA: II  Anesthesia Plan: General   Post-op Pain Management:    Induction: Intravenous  Airway Management Planned: Oral ETT  Additional Equipment:   Intra-op Plan:   Post-operative Plan: Extubation in OR  Informed Consent: I have reviewed the patients History and Physical, chart, labs and discussed the procedure including the risks, benefits and alternatives for the proposed anesthesia with the patient or authorized representative who has indicated his/her understanding and acceptance.   Dental advisory given  Plan Discussed with: CRNA and Surgeon  Anesthesia Plan Comments:         Anesthesia Quick Evaluation

## 2014-03-02 NOTE — H&P (Signed)
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 who initially presented to Dr. Diona Fanti in 2014 for a rising PSA while on 5 ARI therapy for BPH. His PSA increased from a baseline of about 1.3 up to 2.86. This prompted a prostate needle biopsy on 01/27/13 which demonstrated Gleason 3+3=6 adenocarcinoma of the prostate in very small percentages of two cores. He elected to undergo surveillance as an initial course of management. An MRI of the prostate was performed in September of 2014 with no abnormalities to suggest obvious tumor noted. Although his PSA had increased at this time, it was noted that this did correlate with cessation of 5 ARI therapy. Further surveillance demonstrated a decline in his PSA back to 2.93 in early 2015. A repeat surveillance biopsy was performed on 12/03/13 and did demonstrate significant upgrading of his tumor with Gleason 4+3=7 adenocarcinoma noted in 2 out of 12 biopsy cores.  He has no family history of prostate cancer. He is generally healthy with hypercholesterolemia as his only comorbid condition.   Initial diagnosis: April 2014 TNM stage: cT1c Nx Mx PSA at diagnosis: 2.86 (on 5ARI) Gleason score 3+3=6 Biopsy (01/27/13): L apex (5%, 3+3=6), R apex (< 5%, 3+3=6)  Surveillance: Sept 2014: MRI - unremarkable, PSA increase to 3.82 after stopping 5 ARI Feb 2014   (12/03/13): 1  12 core biopsy - R apex (25%, 4+3=7), R lateral apex (10%, 4+3=7), Vol 38.8 cc   Nomogram OC disease: 56% EPE: 36% SVI: 7% LNI: 7% PFS (surgery): 72% at 5 years, 58% at 10 years    Urinary function: He does have BPH and is treated with finasteride and   tamsulosin. IPSS on therapy is 10. 1   Erectile function: He has mild erectile dysfunction treated with occasional Viagra.  SHIM score is 3. He can typically obtain an erection without medication and can typically maintain an erection with medication. His SHIM  score is based on the fact that he has been sexually inactive over the past year related to his wife's medical issues.1      Past Medical History Problems  1. History of hypercholesterolemia (V12.29) 2. History of tendinitis (V13.59) 3. History of tinnitus (V12.49)  Surgical History Problems  1. History of Excision Lesion Of Meniscus Or Capsule Knee 2. History of Excision Lesion Of Meniscus Or Capsule Knee 3. History of Knee Surgery Left 4. History of Wrist Excision Of Ganglion  Current Meds 1. Aspirin 81 MG Oral Tablet;  Therapy: (Recorded:17Feb2014) to Recorded 2. Diclofenac Sodium TBEC;  Therapy: (Recorded:17Feb2014) to Recorded 3. Finasteride 5 MG Oral Tablet; TAKE ONE TABLET BY MOUTH ONCE DAILY;  Therapy: [id number] to (Evaluate:09May2015)  Requested for: [id number]; Last  Rx:10Nov2014 Ordered 4. Multi-Vitamin TABS;  Therapy: (Recorded:17Feb2014) to Recorded 5. Nasonex 50 MCG/ACT Nasal Suspension;  Therapy: (Recorded:17Feb2014) to Recorded 6. Pravastatin Sodium 40 MG Oral Tablet;  Therapy: (Recorded:17Feb2014) to Recorded 7. Resveratrol CAPS;  Therapy: (Recorded:17Feb2014) to Recorded 8. Tamsulosin HCl - 0.4 MG Oral Capsule;  Therapy: (Recorded:17Feb2014) to Recorded 9. Vitamin C TABS;  Therapy: (Recorded:17Feb2014) to Recorded 10. ZyrTEC Allergy 10 MG Oral Tablet;   Therapy: (Recorded:17Feb2014) to Recorded  Allergies Medication  1. No Known Drug Allergies  Family History Problems  1. Family history of Death In The Family Father   85/ stroke 2. Family history of Death In The Family Mother   55/ suicide 3. Family history of Stroke Syndrome (V17.1) : Father  Social History Problems    Alcohol Use   1 - 2  per week   Former smoker [place])   smoked 1 ppd for 59 yrsquit 1994   Marital History - Currently Married   Occupation:   professor  Review of Systems Constitutional, skin, eye, otolaryngeal, hematologic/lymphatic, cardiovascular, pulmonary,  endocrine, musculoskeletal, gastrointestinal, neurological and psychiatric system(s) were reviewed and pertinent findings if present are noted.  Cardiovascular: no chest pain.  Respiratory: no shortness of breath.    Vitals  Height: 6 ft 4 in Weight: 200 lb  BMI Calculated: 24.34 BSA Calculated: 2.22   Physical Exam Constitutional: Well nourished and well developed . No acute distress.  ENT:. The ears and nose are normal in appearance.  Neck: The appearance of the neck is normal and no neck mass is present.  Pulmonary: No respiratory distress, normal respiratory rhythm and effort and clear bilateral breath sounds.  Cardiovascular: Heart rate and rhythm are normal . No peripheral edema.  Abdomen: The abdomen is soft and nontender. No masses are palpated. No CVA tenderness. No hernias are palpable. No hepatosplenomegaly noted.    Assessment Assessed  1. Adenocarcinoma of prostate (185)    Discussion/Summary 1. Clinically localized prostate cancer:  He is very well informed regarding his treatment options and does adamantly wish to proceed with surgical therapy. Especially considering his baseline voiding symptoms and the fact that he is on combination medical therapy for BPH, I do think this makes excellent sense. He will be scheduled for a bilateral nerve sparing robotic-assisted laparoscopic radical prostatectomy and pelvic lymphadenectomy. His metastatic evaluation was negative.

## 2014-03-03 ENCOUNTER — Inpatient Hospital Stay (HOSPITAL_COMMUNITY)
Admission: RE | Admit: 2014-03-03 | Discharge: 2014-03-04 | DRG: 708 | Disposition: A | Discharge status: Home / Self Care | Payer: BC Managed Care – PPO | Source: Ambulatory Visit | Attending: Urology | Admitting: Urology

## 2014-03-03 ENCOUNTER — Encounter (HOSPITAL_COMMUNITY): Payer: Self-pay | Admitting: *Deleted

## 2014-03-03 ENCOUNTER — Encounter (HOSPITAL_COMMUNITY): Payer: BC Managed Care – PPO | Admitting: Anesthesiology

## 2014-03-03 ENCOUNTER — Inpatient Hospital Stay (HOSPITAL_COMMUNITY): Payer: BC Managed Care – PPO | Admitting: Anesthesiology

## 2014-03-03 ENCOUNTER — Encounter (HOSPITAL_COMMUNITY)
Admission: RE | Disposition: A | Discharge status: Home / Self Care | Payer: Self-pay | Source: Ambulatory Visit | Attending: Urology

## 2014-03-03 DIAGNOSIS — N529 Male erectile dysfunction, unspecified: Secondary | ICD-10-CM | POA: Diagnosis present

## 2014-03-03 DIAGNOSIS — Z823 Family history of stroke: Secondary | ICD-10-CM

## 2014-03-03 DIAGNOSIS — N400 Enlarged prostate without lower urinary tract symptoms: Secondary | ICD-10-CM | POA: Diagnosis present

## 2014-03-03 DIAGNOSIS — E7800 Pure hypercholesterolemia, unspecified: Secondary | ICD-10-CM | POA: Diagnosis present

## 2014-03-03 DIAGNOSIS — Z87891 Personal history of nicotine dependence: Secondary | ICD-10-CM

## 2014-03-03 DIAGNOSIS — C61 Malignant neoplasm of prostate: Principal | ICD-10-CM | POA: Diagnosis present

## 2014-03-03 HISTORY — PX: ROBOT ASSISTED LAPAROSCOPIC RADICAL PROSTATECTOMY: SHX5141

## 2014-03-03 HISTORY — PX: LYMPHADENECTOMY: SHX5960

## 2014-03-03 LAB — HEMOGLOBIN AND HEMATOCRIT, BLOOD
HCT: 38.1 % — ABNORMAL LOW (ref 39.0–52.0)
HEMOGLOBIN: 12.4 g/dL — AB (ref 13.0–17.0)

## 2014-03-03 LAB — ABO/RH: ABO/RH(D): A POS

## 2014-03-03 LAB — TYPE AND SCREEN
ABO/RH(D): A POS
Antibody Screen: NEGATIVE

## 2014-03-03 SURGERY — ROBOTIC ASSISTED LAPAROSCOPIC RADICAL PROSTATECTOMY LEVEL 2
Anesthesia: General

## 2014-03-03 MED ORDER — MORPHINE SULFATE 2 MG/ML IJ SOLN: 2.0000 mg | INTRAMUSCULAR | Status: DC | PRN

## 2014-03-03 MED ORDER — FENTANYL CITRATE 0.05 MG/ML IJ SOLN
INTRAMUSCULAR | Status: DC | PRN
  Administered 2014-03-03 (×2): 50 ug via INTRAVENOUS
  Administered 2014-03-03 (×2): 100 ug via INTRAVENOUS
  Administered 2014-03-03: 50 ug via INTRAVENOUS

## 2014-03-03 MED ORDER — PROMETHAZINE HCL 25 MG/ML IJ SOLN: 6.2500 mg | INTRAMUSCULAR | Status: DC | PRN

## 2014-03-03 MED ORDER — LIDOCAINE HCL (PF) 2 % IJ SOLN
INTRAMUSCULAR | Status: DC | PRN
  Administered 2014-03-03: 75 mg via INTRADERMAL

## 2014-03-03 MED ORDER — LACTATED RINGERS IV SOLN
INTRAVENOUS | Status: DC | PRN
  Administered 2014-03-03 (×3): via INTRAVENOUS

## 2014-03-03 MED ORDER — ATROPINE SULFATE 0.4 MG/ML IJ SOLN
INTRAMUSCULAR | Status: DC | PRN
  Administered 2014-03-03: 0.4 mg via INTRAVENOUS

## 2014-03-03 MED ORDER — CISATRACURIUM BESYLATE 20 MG/10ML IV SOLN
INTRAVENOUS | Status: AC
  Filled 2014-03-03: qty 10

## 2014-03-03 MED ORDER — KETOROLAC TROMETHAMINE 30 MG/ML IJ SOLN: 15.0000 mg | Freq: Once | INTRAMUSCULAR | Status: DC | PRN

## 2014-03-03 MED ORDER — PROPOFOL 10 MG/ML IV BOLUS
INTRAVENOUS | Status: AC
  Filled 2014-03-03: qty 20

## 2014-03-03 MED ORDER — STERILE WATER FOR IRRIGATION IR SOLN
Status: DC | PRN
Start: 2014-03-03 — End: 2014-03-03
  Administered 2014-03-03: 3000 mL

## 2014-03-03 MED ORDER — ATROPINE SULFATE 0.4 MG/ML IJ SOLN
INTRAMUSCULAR | Status: AC
  Filled 2014-03-03: qty 1

## 2014-03-03 MED ORDER — EPHEDRINE SULFATE 50 MG/ML IJ SOLN
INTRAMUSCULAR | Status: AC
  Filled 2014-03-03: qty 1

## 2014-03-03 MED ORDER — LIDOCAINE HCL (CARDIAC) 20 MG/ML IV SOLN
INTRAVENOUS | Status: AC
  Filled 2014-03-03: qty 5

## 2014-03-03 MED ORDER — HYDROMORPHONE HCL PF 1 MG/ML IJ SOLN
0.2500 mg | INTRAMUSCULAR | Status: DC | PRN
  Administered 2014-03-03 (×2): 0.5 mg via INTRAVENOUS

## 2014-03-03 MED ORDER — KETOROLAC TROMETHAMINE 15 MG/ML IJ SOLN
15.0000 mg | Freq: Four times a day (QID) | INTRAMUSCULAR | Status: DC
  Administered 2014-03-03 – 2014-03-04 (×3): 15 mg via INTRAVENOUS
  Filled 2014-03-03 (×4): qty 1

## 2014-03-03 MED ORDER — SUCCINYLCHOLINE CHLORIDE 20 MG/ML IJ SOLN
INTRAMUSCULAR | Status: DC | PRN
  Administered 2014-03-03: 100 mg via INTRAVENOUS

## 2014-03-03 MED ORDER — FENTANYL CITRATE 0.05 MG/ML IJ SOLN
INTRAMUSCULAR | Status: AC
  Filled 2014-03-03: qty 2

## 2014-03-03 MED ORDER — HYDROMORPHONE HCL PF 1 MG/ML IJ SOLN
INTRAMUSCULAR | Status: AC
  Filled 2014-03-03: qty 1

## 2014-03-03 MED ORDER — LACTATED RINGERS IV SOLN: INTRAVENOUS | Status: DC

## 2014-03-03 MED ORDER — SIMVASTATIN 10 MG PO TABS
10.0000 mg | ORAL_TABLET | Freq: Every day | ORAL | Status: DC
Start: 2014-03-03 — End: 2014-03-04
  Administered 2014-03-03: 10 mg via ORAL
  Filled 2014-03-03 (×2): qty 1

## 2014-03-03 MED ORDER — HYDROCODONE-ACETAMINOPHEN 5-325 MG PO TABS: 1.0000 | ORAL_TABLET | Freq: Four times a day (QID) | ORAL | Status: DC | PRN

## 2014-03-03 MED ORDER — CEFAZOLIN SODIUM-DEXTROSE 2-3 GM-% IV SOLR
2.0000 g | INTRAVENOUS | Status: AC
  Administered 2014-03-03: 2 g via INTRAVENOUS

## 2014-03-03 MED ORDER — NEOSTIGMINE METHYLSULFATE 10 MG/10ML IV SOLN
INTRAVENOUS | Status: DC | PRN
  Administered 2014-03-03: 4 mg via INTRAVENOUS

## 2014-03-03 MED ORDER — LACTATED RINGERS IV SOLN
INTRAVENOUS | Status: DC | PRN
  Administered 2014-03-03: 09:00:00

## 2014-03-03 MED ORDER — EPHEDRINE SULFATE 50 MG/ML IJ SOLN
INTRAMUSCULAR | Status: DC | PRN
  Administered 2014-03-03: 5 mg via INTRAVENOUS

## 2014-03-03 MED ORDER — BUPIVACAINE-EPINEPHRINE (PF) 0.25% -1:200000 IJ SOLN
INTRAMUSCULAR | Status: AC
  Filled 2014-03-03: qty 30

## 2014-03-03 MED ORDER — CEFAZOLIN SODIUM-DEXTROSE 2-3 GM-% IV SOLR
INTRAVENOUS | Status: AC
  Filled 2014-03-03: qty 50

## 2014-03-03 MED ORDER — GLYCOPYRROLATE 0.2 MG/ML IJ SOLN
INTRAMUSCULAR | Status: DC | PRN
  Administered 2014-03-03: 0.6 mg via INTRAVENOUS

## 2014-03-03 MED ORDER — PROPOFOL 10 MG/ML IV BOLUS
INTRAVENOUS | Status: DC | PRN
  Administered 2014-03-03: 200 mg via INTRAVENOUS

## 2014-03-03 MED ORDER — ACETAMINOPHEN 325 MG PO TABS: 650.0000 mg | ORAL_TABLET | ORAL | Status: DC | PRN

## 2014-03-03 MED ORDER — DEXAMETHASONE SODIUM PHOSPHATE 10 MG/ML IJ SOLN
INTRAMUSCULAR | Status: DC | PRN
  Administered 2014-03-03: 10 mg via INTRAVENOUS

## 2014-03-03 MED ORDER — SODIUM CHLORIDE 0.9 % IR SOLN
Status: DC | PRN
Start: 2014-03-03 — End: 2014-03-03
  Administered 2014-03-03: 1000 mL

## 2014-03-03 MED ORDER — FENTANYL CITRATE 0.05 MG/ML IJ SOLN
INTRAMUSCULAR | Status: AC
  Filled 2014-03-03: qty 5

## 2014-03-03 MED ORDER — BUPIVACAINE-EPINEPHRINE 0.25% -1:200000 IJ SOLN
INTRAMUSCULAR | Status: DC | PRN
  Administered 2014-03-03: 30 mL

## 2014-03-03 MED ORDER — KETOROLAC TROMETHAMINE 30 MG/ML IJ SOLN
30.0000 mg | Freq: Four times a day (QID) | INTRAMUSCULAR | Status: DC
  Administered 2014-03-03: 30 mg via INTRAVENOUS
  Filled 2014-03-03: qty 2
  Filled 2014-03-03 (×4): qty 1

## 2014-03-03 MED ORDER — HYDROCODONE-ACETAMINOPHEN 5-325 MG PO TABS: 1.0000 | ORAL_TABLET | ORAL | Status: DC | PRN

## 2014-03-03 MED ORDER — SODIUM CHLORIDE 0.9 % IV BOLUS (SEPSIS)
1000.0000 mL | Freq: Once | INTRAVENOUS | Status: AC
  Administered 2014-03-03: 1000 mL via INTRAVENOUS

## 2014-03-03 MED ORDER — ONDANSETRON HCL 4 MG/2ML IJ SOLN
INTRAMUSCULAR | Status: AC
  Filled 2014-03-03: qty 2

## 2014-03-03 MED ORDER — CIPROFLOXACIN HCL 500 MG PO TABS: 500.0000 mg | ORAL_TABLET | Freq: Two times a day (BID) | ORAL | Status: DC

## 2014-03-03 MED ORDER — DEXAMETHASONE SODIUM PHOSPHATE 10 MG/ML IJ SOLN
INTRAMUSCULAR | Status: AC
  Filled 2014-03-03: qty 1

## 2014-03-03 MED ORDER — MIDAZOLAM HCL 2 MG/2ML IJ SOLN
INTRAMUSCULAR | Status: AC
  Filled 2014-03-03: qty 2

## 2014-03-03 MED ORDER — GLYCOPYRROLATE 0.2 MG/ML IJ SOLN
INTRAMUSCULAR | Status: AC
  Filled 2014-03-03: qty 3

## 2014-03-03 MED ORDER — SODIUM CHLORIDE 0.9 % IJ SOLN
INTRAMUSCULAR | Status: AC
  Filled 2014-03-03: qty 10

## 2014-03-03 MED ORDER — ONDANSETRON HCL 4 MG/2ML IJ SOLN
INTRAMUSCULAR | Status: DC | PRN
  Administered 2014-03-03: 4 mg via INTRAVENOUS

## 2014-03-03 MED ORDER — MIDAZOLAM HCL 5 MG/5ML IJ SOLN
INTRAMUSCULAR | Status: DC | PRN
  Administered 2014-03-03: 2 mg via INTRAVENOUS

## 2014-03-03 MED ORDER — DEXTROSE-NACL 5-0.45 % IV SOLN
INTRAVENOUS | Status: DC
  Administered 2014-03-03: 11:00:00 via INTRAVENOUS

## 2014-03-03 MED ORDER — NEOSTIGMINE METHYLSULFATE 10 MG/10ML IV SOLN
INTRAVENOUS | Status: AC
  Filled 2014-03-03: qty 1

## 2014-03-03 MED ORDER — HEPARIN SODIUM (PORCINE) 1000 UNIT/ML IJ SOLN
INTRAMUSCULAR | Status: AC
  Filled 2014-03-03: qty 1

## 2014-03-03 MED ORDER — CISATRACURIUM BESYLATE (PF) 10 MG/5ML IV SOLN
INTRAVENOUS | Status: DC | PRN
  Administered 2014-03-03: 4 mg via INTRAVENOUS
  Administered 2014-03-03: 14 mg via INTRAVENOUS
  Administered 2014-03-03: 2 mg via INTRAVENOUS

## 2014-03-03 SURGICAL SUPPLY — 40 items
CABLE HIGH FREQUENCY MONO STRZ (ELECTRODE) ×4 IMPLANT
CATH FOLEY 2WAY SLVR 18FR 30CC (CATHETERS) ×4 IMPLANT
CATH ROBINSON RED A/P 16FR (CATHETERS) ×4 IMPLANT
CATH ROBINSON RED A/P 8FR (CATHETERS) ×4 IMPLANT
CATH TIEMANN FOLEY 18FR 5CC (CATHETERS) ×4 IMPLANT
CHLORAPREP W/TINT 26ML (MISCELLANEOUS) ×4 IMPLANT
CLIP LIGATING HEM O LOK PURPLE (MISCELLANEOUS) ×8 IMPLANT
CLOTH BEACON ORANGE TIMEOUT ST (SAFETY) ×4 IMPLANT
COVER SURGICAL LIGHT HANDLE (MISCELLANEOUS) ×4 IMPLANT
COVER TIP SHEARS 8 DVNC (MISCELLANEOUS) ×2 IMPLANT
COVER TIP SHEARS 8MM DA VINCI (MISCELLANEOUS) ×2
CUTTER ECHEON FLEX ENDO 45 340 (ENDOMECHANICALS) ×4 IMPLANT
DECANTER SPIKE VIAL GLASS SM (MISCELLANEOUS) IMPLANT
DERMABOND ADVANCED (GAUZE/BANDAGES/DRESSINGS) ×2
DERMABOND ADVANCED .7 DNX12 (GAUZE/BANDAGES/DRESSINGS) ×2 IMPLANT
DRAPE SURG IRRIG POUCH 19X23 (DRAPES) ×4 IMPLANT
DRSG TEGADERM 4X4.75 (GAUZE/BANDAGES/DRESSINGS) ×4 IMPLANT
DRSG TEGADERM 6X8 (GAUZE/BANDAGES/DRESSINGS) ×8 IMPLANT
ELECT REM PT RETURN 9FT ADLT (ELECTROSURGICAL) ×4
ELECTRODE REM PT RTRN 9FT ADLT (ELECTROSURGICAL) ×2 IMPLANT
GLOVE BIO SURGEON STRL SZ 6.5 (GLOVE) ×3 IMPLANT
GLOVE BIO SURGEONS STRL SZ 6.5 (GLOVE) ×1
GLOVE BIOGEL M STRL SZ7.5 (GLOVE) ×8 IMPLANT
GOWN STRL REUS W/TWL LRG LVL3 (GOWN DISPOSABLE) ×16 IMPLANT
HOLDER FOLEY CATH W/STRAP (MISCELLANEOUS) ×4 IMPLANT
KIT ACCESSORY DA VINCI DISP (KITS) ×2
KIT ACCESSORY DVNC DISP (KITS) ×2 IMPLANT
MANIFOLD NEPTUNE II (INSTRUMENTS) ×4 IMPLANT
NDL SAFETY ECLIPSE 18X1.5 (NEEDLE) IMPLANT
NEEDLE HYPO 18GX1.5 SHARP (NEEDLE)
PACK ROBOT UROLOGY CUSTOM (CUSTOM PROCEDURE TRAY) ×4 IMPLANT
RELOAD GREEN ECHELON 45 (STAPLE) ×4 IMPLANT
SET TUBE IRRIG SUCTION NO TIP (IRRIGATION / IRRIGATOR) ×4 IMPLANT
SOLUTION ELECTROLUBE (MISCELLANEOUS) ×4 IMPLANT
SUT ETHILON 3 0 PS 1 (SUTURE) ×4 IMPLANT
SUT MNCRL AB 4-0 PS2 18 (SUTURE) ×8 IMPLANT
SUT VICRYL 0 UR6 27IN ABS (SUTURE) ×8 IMPLANT
SYR 27GX1/2 1ML LL SAFETY (SYRINGE) ×4 IMPLANT
TOWEL OR 17X26 10 PK STRL BLUE (TOWEL DISPOSABLE) ×4 IMPLANT
TOWEL OR NON WOVEN STRL DISP B (DISPOSABLE) ×4 IMPLANT

## 2014-03-03 NOTE — Op Note (Signed)
Preoperative diagnosis: Clinically localized adenocarcinoma of the prostate (clinical stage cT1c N0 M0)  Postoperative diagnosis: Clinically localized adenocarcinoma of the prostate (clinical stage cT1c N0 M0)  Procedure:  1. Robotic assisted laparoscopic radical prostatectomy (bilateral nerve sparing) 2. Bilateral robotic assisted laparoscopic pelvic lymphadenectomy  Surgeon: Pryor Curia. M.D.  Assistant: Leta Baptist, PA-C  Anesthesia: General  Complications: None  EBL: 50 mL  IVF:  2000 mL crystalloid  Specimens: 1. Prostate and seminal vesicles 2. Right pelvic lymph nodes 3. Left pelvic lymph nodes  Disposition of specimens: Pathology  Drains: 1. 20 Fr coude catheter 2. # 19 Blake pelvic drain  Indication: Dwayne Young is a 63 y.o. year old patient with clinically localized prostate cancer.  After a thorough review of the management options for treatment of prostate cancer, he elected to proceed with surgical therapy and the above procedure(s).  We have discussed the potential benefits and risks of the procedure, side effects of the proposed treatment, the likelihood of the patient achieving the goals of the procedure, and any potential problems that might occur during the procedure or recuperation. Informed consent has been obtained.  Description of procedure:  The patient was taken to the operating room and a general anesthetic was administered. He was given preoperative antibiotics, placed in the dorsal lithotomy position, and prepped and draped in the usual sterile fashion. Next a preoperative timeout was performed. A urethral catheter was placed into the bladder and a site was selected near the umbilicus for placement of the camera port. This was placed using a standard open Hassan technique which allowed entry into the peritoneal cavity under direct vision and without difficulty. A 12 mm port was placed and a pneumoperitoneum established. The camera was  then used to inspect the abdomen and there was no evidence of any intra-abdominal injuries or other abnormalities. The remaining abdominal ports were then placed. 8 mm robotic ports were placed in the right lower quadrant, left lower quadrant, and far left lateral abdominal wall. A 5 mm port was placed in the right upper quadrant and a 12 mm port was placed in the right lateral abdominal wall for laparoscopic assistance. All ports were placed under direct vision without difficulty. The surgical cart was then docked.   Utilizing the cautery scissors, the bladder was reflected posteriorly allowing entry into the space of Retzius and identification of the endopelvic fascia and prostate. The periprostatic fat was then removed from the prostate allowing full exposure of the endopelvic fascia. The endopelvic fascia was then incised from the apex back to the base of the prostate bilaterally and the underlying levator muscle fibers were swept laterally off the prostate thereby isolating the dorsal venous complex. The dorsal vein was then stapled and divided with a 45 mm Flex Echelon stapler. Attention then turned to the bladder neck which was divided anteriorly thereby allowing entry into the bladder and exposure of the urethral catheter. The catheter balloon was deflated and the catheter was brought into the operative field and used to retract the prostate anteriorly. The posterior bladder neck was then examined and was divided allowing further dissection between the bladder and prostate posteriorly until the vasa deferentia and seminal vessels were identified. The vasa deferentia were isolated, divided, and lifted anteriorly. The seminal vesicles were dissected down to their tips with care to control the seminal vascular arterial blood supply. These structures were then lifted anteriorly and the space between Denonvillier's fascia and the anterior rectum was developed with a combination  of sharp and blunt dissection.  This isolated the vascular pedicles of the prostate.  The lateral prostatic fascia was then sharply incised allowing release of the neurovascular bundles bilaterally. The vascular pedicles of the prostate were then ligated with Weck clips between the prostate and neurovascular bundles and divided with sharp cold scissor dissection resulting in neurovascular bundle preservation. The neurovascular bundles were then separated off the apex of the prostate and urethra bilaterally.  The urethra was then sharply transected allowing the prostate specimen to be disarticulated. The pelvis was copiously irrigated and hemostasis was ensured. There was no evidence for rectal injury.  Attention then turned to the right pelvic sidewall. The fibrofatty tissue between the external iliac vein, confluence of the iliac vessels, hypogastric artery, and Cooper's ligament was dissected free from the pelvic sidewall with care to preserve the obturator nerve. Weck clips were used for lymphostasis and hemostasis. An identical procedure was performed on the contralateral side and the lymphatic packets were removed for permanent pathologic analysis.  Attention then turned to the urethral anastomosis. A 2-0 Vicryl slip knot was placed between Denonvillier's fascia, the posterior bladder neck, and the posterior urethra to reapproximate these structures. A double-armed 3-0 Monocryl suture was then used to perform a 360 running tension-free anastomosis between the bladder neck and urethra. A new urethral catheter was then placed into the bladder and irrigated. There were no blood clots within the bladder and the anastomosis appeared to be watertight. A #19 Blake drain was then brought through the left lateral 8 mm port site and positioned appropriately within the pelvis. It was secured to the skin with a nylon suture. The surgical cart was then undocked. The right lateral 12 mm port site was closed at the fascial level with a 0 Vicryl  suture placed laparoscopically. All remaining ports were then removed under direct vision. The prostate specimen was removed intact within the Endopouch retrieval bag via the periumbilical camera port site. This fascial opening was closed with two running 0 Vicryl sutures. 0.25% Marcaine was then injected into all port sites and all incisions were reapproximated at the skin level with 4-0 Monocryl subcuticular sutures and Dermabond. The patient appeared to tolerate the procedure well and without complications. The patient was able to be extubated and transferred to the recovery unit in satisfactory condition.   Pryor Curia MD

## 2014-03-03 NOTE — Anesthesia Postprocedure Evaluation (Signed)
  Anesthesia Post-op Note  Patient: Dwayne Young  Procedure(s) Performed: Procedure(s) (LRB): ROBOTIC ASSISTED LAPAROSCOPIC RADICAL PROSTATECTOMY LEVEL 2 (N/A) LYMPHADENECTOMY (Bilateral)  Patient Location: PACU  Anesthesia Type: General  Level of Consciousness: awake and alert   Airway and Oxygen Therapy: Patient Spontanous Breathing  Post-op Pain: mild  Post-op Assessment: Post-op Vital signs reviewed, Patient's Cardiovascular Status Stable, Respiratory Function Stable, Patent Airway and No signs of Nausea or vomiting  Last Vitals:  Filed Vitals:   03/03/14 1045  BP: 114/57  Pulse: 56  Temp:   Resp: 10    Post-op Vital Signs: stable   Complications: No apparent anesthesia complications

## 2014-03-03 NOTE — Discharge Instructions (Signed)
1. Activity:  You are encouraged to ambulate frequently (about every hour during waking hours) to help prevent blood clots from forming in your legs or lungs.  However, you should not engage in any heavy lifting (> 10-15 lbs), strenuous activity, or straining. 2. Diet: You should continue a clear liquid diet until passing gas from below.  Once this occurs, you may advance your diet to a soft diet that would be easy to digest (i.e soups, scrambled eggs, mashed potatoes, etc.) for 24 hours just as you would if getting over a bad stomach flu.  If tolerating this diet well for 24 hours, you may then begin eating regular food.  It will be normal to have some amount of bloating, nausea, and abdominal discomfort intermittently. 3. Prescriptions:  You will be provided a prescription for pain medication to take as needed.  If your pain is not severe enough to require the prescription pain medication, you may take Tylenol instead.  You should also take an over the counter stool softener (Colace 100 mg twice daily) to avoid straining with bowel movements as the pain medication may constipate you. Finally, you will also be provided a prescription for an antibiotic to begin the day prior to your return visit in the office for catheter removal. 4. Catheter care: You will be taught how to take care of the catheter by the nursing staff prior to discharge from the hospital.  You may use both a leg bag and the larger bedside bag but it is recommended to at least use the bigger bedside bag at nighttime as the leg bag is small and will fill up overnight and also does not drain as well when lying flat. You may periodically feel a strong urge to void with the catheter in place.  This is a bladder spasm and most often can occur when having a bowel movement or when you are moving around. It is typically self-limited and usually will stop after a few minutes.  You may use some Vaseline or Neosporin around the tip of the catheter to  reduce friction at the tip of the penis. 5. Incisions: You may remove your dressing bandages the 2nd day after surgery.  You most likely will have a few small staples in each of the incisions and once the bandages are removed, the incisions may stay open to air.  You may start showering (not soaking or bathing in water) 48 hours after surgery and the incisions simply need to be patted dry after the shower.  No additional care is needed. 6. What to call us about: You should call the office [phone]) if you develop fever > 101, persistent vomiting, or the catheter stops draining. Also, feel free to call with any other questions you may have and remember the handout that was provided to you as a reference preoperatively which answers many of the common questions that arise after surgery.  You may resume aspirin, voltaren, advil, aleve, vitamins, and supplements 7 days after surgery.

## 2014-03-03 NOTE — Transfer of Care (Signed)
Immediate Anesthesia Transfer of Care Note  Patient: Dwayne Young  Procedure(s) Performed: Procedure(s): ROBOTIC ASSISTED LAPAROSCOPIC RADICAL PROSTATECTOMY LEVEL 2 (N/A) LYMPHADENECTOMY (Bilateral)  Patient Location: PACU  Anesthesia Type:General  Level of Consciousness: awake, alert  and patient cooperative  Airway & Oxygen Therapy: Patient Spontanous Breathing and Patient connected to face mask oxygen  Post-op Assessment: Report given to PACU RN and Post -op Vital signs reviewed and stable  Post vital signs: Reviewed and stable  Complications: No apparent anesthesia complications

## 2014-03-03 NOTE — Progress Notes (Signed)
Patient ID: Dwayne Young, male   DOB: 12/09/1950, 63 y.o.   MRN: [id number] Post-op note  Subjective: The patient is doing well.  No complaints.  Has ambulated and is using IS.  Denies N/V.  Objective: Vital signs in last 24 hours: Temp:  [97.9 F (36.6 C)-98.4 F (36.9 C)] 98.2 F (36.8 C) (05/21 1348) Pulse Rate:  [54-72] 72 (05/21 1348) Resp:  [6-18] 14 (05/21 1348) BP: (102-151)/(51-88) 102/64 mmHg (05/21 1348) SpO2:  [98 %-100 %] 100 % (05/21 1348) Weight:  [93 kg (205 lb 0.4 oz)] 93 kg (205 lb 0.4 oz) (05/21 0628)  Intake/Output from previous day:   Intake/Output this shift: Total I/O In: 2700 [P.O.:600; I.V.:2100] Out: 250 [Urine:125; Drains:75; Blood:50]  Physical Exam:  General: Alert and oriented. Abdomen: Soft, Nondistended. Incisions: Clean and dry. Urine: red  Lab Results:  Recent Labs  03/03/14 1041  HGB 12.4*  HCT 38.1*    Assessment/Plan: POD#0   1) Continue to monitor  2) DVT prophy, clears, IS, amb, pain control    LOS: 0 days   Marcie Bal 03/03/2014, 3:16 PM

## 2014-03-03 NOTE — Progress Notes (Signed)
Patient ambulated in hall from one end to the other twice with minimal discomfort.  Tolerated well.  Encouraged patient to ambulate every 3-4 hours if possible and patient agreed.  Will continue to monitor.

## 2014-03-03 NOTE — Care Management Note (Addendum)
    Page 1 of 1   03/04/2014     3:47:26 PM CARE MANAGEMENT NOTE 03/04/2014  Patient:  Dwayne Young, Dwayne Young   Account Number:  [id number]  Date Initiated:  03/03/2014  Documentation initiated by:  Dessa Phi  Subjective/Objective Assessment:   64 Y/O M ADMITTED W/PROSTATE CA.     Action/Plan:   FROM HOME.HAS PCP,PHARMACY.   Anticipated DC Date:  03/04/2014   Anticipated DC Plan:  [place]  CM consult      Choice offered to / List presented to:             Status of service:  Completed, signed off Medicare Important Message given?   (If response is "NO", the following Medicare IM given date fields will be blank) Date Medicare IM given:   Date Additional Medicare IM given:    Discharge Disposition:  HOME/SELF CARE  Per UR Regulation:  Reviewed for med. necessity/level of care/duration of stay  If discussed at Long Length of Stay Meetings, dates discussed:    Comments:  03/03/14 Melady Chow RN,BSN NCM [phone] S/P ROB LAP PROSTATECTOMY.NO ANTICIPATED D/C NEEDS.

## 2014-03-04 ENCOUNTER — Encounter (HOSPITAL_COMMUNITY): Payer: Self-pay | Admitting: Urology

## 2014-03-04 LAB — HEMOGLOBIN AND HEMATOCRIT, BLOOD
HCT: 36.3 % — ABNORMAL LOW (ref 39.0–52.0)
Hemoglobin: 12.1 g/dL — ABNORMAL LOW (ref 13.0–17.0)

## 2014-03-04 MED ORDER — HYDROCODONE-ACETAMINOPHEN 5-325 MG PO TABS: 1.0000 | ORAL_TABLET | Freq: Four times a day (QID) | ORAL | Status: DC | PRN

## 2014-03-04 MED ORDER — BISACODYL 10 MG RE SUPP
10.0000 mg | Freq: Once | RECTAL | Status: DC
  Filled 2014-03-04: qty 1

## 2014-03-04 NOTE — Progress Notes (Signed)
D/C instructions reviewed w/ pt and wife. All questions answered, no further questions. Pt d/c in w/c in stable condition by NT. Pt d/c to wife's car in possession of d/c instructions, scripts, and all personal belongings.

## 2014-03-04 NOTE — Discharge Summary (Signed)
  Date of admission: 03/03/2014  Date of discharge: 03/04/2014  Admission diagnosis: Prostate Cancer  Discharge diagnosis: Prostate Cancer  History and Physical: For full details, please see admission history and physical. Briefly, Dwayne Young is a 63 y.o. gentleman with localized prostate cancer.  After discussing management/treatment options, he elected to proceed with surgical treatment.  Hospital Course: Dwayne Young was taken to the operating room on 03/03/2014 and underwent a robotic assisted laparoscopic radical prostatectomy. He tolerated this procedure well and without complications. Postoperatively, he was able to be transferred to a regular hospital room following recovery from anesthesia.  He was able to begin ambulating the night of surgery. He remained hemodynamically stable overnight.  He had excellent urine output with appropriately minimal output from his pelvic drain and his pelvic drain was removed on POD #1.  He was transitioned to oral pain medication, tolerated a clear liquid diet, and had met all discharge criteria and was able to be discharged home later on POD#1.  Laboratory values:  Recent Labs  03/03/14 1041 03/04/14 0325  HGB 12.4* 12.1*  HCT 38.1* 36.3*    Disposition: Home  Discharge instruction: He was instructed to be ambulatory but to refrain from heavy lifting, strenuous activity, or driving. He was instructed on urethral catheter care.  Discharge medications:     Medication List    STOP taking these medications       aspirin 81 MG tablet     diclofenac 75 MG EC tablet  Commonly known as:  VOLTAREN     ibuprofen 200 MG tablet  Commonly known as:  ADVIL,MOTRIN     multivitamin tablet     tamsulosin 0.4 MG Caps capsule  Commonly known as:  FLOMAX     vitamin C 500 MG tablet  Commonly known as:  ASCORBIC ACID      TAKE these medications       ciprofloxacin 500 MG tablet  Commonly known as:  CIPRO  Take 1 tablet (500 mg total) by  mouth 2 (two) times daily. Start day prior to office visit for foley removal     fexofenadine 180 MG tablet  Commonly known as:  ALLEGRA  Take 180 mg by mouth daily.     HYDROcodone-acetaminophen 5-325 MG per tablet  Commonly known as:  NORCO  Take 1-2 tablets by mouth every 6 (six) hours as needed.     mometasone 50 MCG/ACT nasal spray  Commonly known as:  NASONEX  Place 2 sprays into the nose daily.     pravastatin 20 MG tablet  Commonly known as:  PRAVACHOL  Take 40 mg by mouth daily.        Followup: He will followup in 1 week for catheter removal and to discuss his surgical pathology results.

## 2014-03-04 NOTE — Progress Notes (Addendum)
Patient ID: Dwayne Young, male   DOB: [birth year], 63 y.o.   MRN: [id number]  1 Day Post-Op Subjective: The patient is doing well.  No nausea or vomiting. Pain is adequately controlled.  Objective: Vital signs in last 24 hours: Temp:  [97.9 F (36.6 C)-98.4 F (36.9 C)] 98 F (36.7 C) (05/22 0200) Pulse Rate:  [54-72] 58 (05/22 0200) Resp:  [6-20] 20 (05/22 0200) BP: (102-114)/(51-66) 102/61 mmHg (05/22 0200) SpO2:  [94 %-100 %] 99 % (05/22 0200)  Intake/Output from previous day: 05/21 0701 - 05/22 0700 In: 5705 [P.O.:840; I.V.:4865] Out: 1980 [GDJME:2683; Drains:155; Blood:50] Intake/Output this shift:    Physical Exam:  General: Alert and oriented. CV: RRR Lungs: Clear bilaterally. GI: Soft, Mildly distended, positive bowel sounds Incisions: Clean, dry, and intact Urine: Clear Extremities: Nontender, no erythema, no edema.  Lab Results:  Recent Labs  03/03/14 1041 03/04/14 0325  HGB 12.4* 12.1*  HCT 38.1* 36.3*      Assessment/Plan: POD# 1 s/p robotic prostatectomy.  1) SL IVF 2) Ambulate, Incentive spirometry 3) Transition to oral pain medication 4) Dulcolax suppository 5) D/C pelvic drain 6) Plan for likely discharge later today   Pryor Curia. MD   LOS: 1 day   Raynelle Bring 03/04/2014, 7:11 AM

## 2014-03-16 ENCOUNTER — Other Ambulatory Visit: Payer: Self-pay | Admitting: Family Medicine

## 2014-03-16 MED ORDER — PRAVASTATIN SODIUM 20 MG PO TABS: 40.0000 mg | ORAL_TABLET | Freq: Every day | ORAL | Status: DC

## 2014-04-05 ENCOUNTER — Other Ambulatory Visit: Payer: BC Managed Care – PPO

## 2014-04-05 DIAGNOSIS — Z79899 Other long term (current) drug therapy: Secondary | ICD-10-CM

## 2014-04-05 DIAGNOSIS — E785 Hyperlipidemia, unspecified: Secondary | ICD-10-CM

## 2014-04-05 LAB — COMPLETE METABOLIC PANEL WITH GFR
ALBUMIN: 4.1 g/dL (ref 3.5–5.2)
ALT: 12 U/L (ref 0–53)
AST: 15 U/L (ref 0–37)
Alkaline Phosphatase: 61 U/L (ref 39–117)
BILIRUBIN TOTAL: 0.4 mg/dL (ref 0.2–1.2)
BUN: 18 mg/dL (ref 6–23)
CO2: 27 mEq/L (ref 19–32)
Calcium: 8.7 mg/dL (ref 8.4–10.5)
Chloride: 106 mEq/L (ref 96–112)
Creat: 1.19 mg/dL (ref 0.50–1.35)
GFR, Est African American: 74 mL/min
GFR, Est Non African American: 64 mL/min
Glucose, Bld: 92 mg/dL (ref 70–99)
Potassium: 4.6 mEq/L (ref 3.5–5.3)
SODIUM: 141 meq/L (ref 135–145)
TOTAL PROTEIN: 6.4 g/dL (ref 6.0–8.3)

## 2014-04-05 LAB — LIPID PANEL
CHOL/HDL RATIO: 3.4 ratio
CHOLESTEROL: 175 mg/dL (ref 0–200)
HDL: 51 mg/dL (ref >39)
LDL Cholesterol: 101 mg/dL — ABNORMAL HIGH (ref 0–99)
Triglycerides: 114 mg/dL (ref <150)
VLDL: 23 mg/dL (ref 0–40)

## 2014-04-11 ENCOUNTER — Encounter: Payer: Self-pay | Admitting: Family Medicine

## 2014-04-11 ENCOUNTER — Ambulatory Visit (INDEPENDENT_AMBULATORY_CARE_PROVIDER_SITE_OTHER): Payer: BC Managed Care – PPO | Admitting: Family Medicine

## 2014-04-11 VITALS — BP 130/80 | HR 68 | Temp 97.3°F | Resp 16 | Ht 75.0 in | Wt 198.0 lb

## 2014-04-11 DIAGNOSIS — E785 Hyperlipidemia, unspecified: Secondary | ICD-10-CM

## 2014-04-11 NOTE — Progress Notes (Signed)
Subjective:    Patient ID: Dwayne Young, male    DOB: 1951-05-06, 63 y.o.   MRN: [id number]  HPI Patient is here today for followup of his hyperlipidemia. He is currently on pravastatin 40 mg by mouth daily. One month ago he underwent robotic prostatectomy.  He has done remarkably well since the surgery. He is having no issues with incontinence. He is still suffering from erectile dysfunction but he is only one month out from surgery. He has no medical concerns. He is at a healthy weight for his height. His most recent fasting lab work is listed below: Lab on 04/05/2014  Component Date Value Ref Range Status  . Sodium 04/05/2014 141  135 - 145 mEq/L Final  . Potassium 04/05/2014 4.6  3.5 - 5.3 mEq/L Final  . Chloride 04/05/2014 106  96 - 112 mEq/L Final  . CO2 04/05/2014 27  19 - 32 mEq/L Final  . Glucose, Bld 04/05/2014 92  70 - 99 mg/dL Final  . BUN 04/05/2014 18  6 - 23 mg/dL Final  . Creat 04/05/2014 1.19  0.50 - 1.35 mg/dL Final  . Total Bilirubin 04/05/2014 0.4  0.2 - 1.2 mg/dL Final  . Alkaline Phosphatase 04/05/2014 61  39 - 117 U/L Final  . AST 04/05/2014 15  0 - 37 U/L Final  . ALT 04/05/2014 12  0 - 53 U/L Final  . Total Protein 04/05/2014 6.4  6.0 - 8.3 g/dL Final  . Albumin 04/05/2014 4.1  3.5 - 5.2 g/dL Final  . Calcium 04/05/2014 8.7  8.4 - 10.5 mg/dL Final  . GFR, Est African American 04/05/2014 74   Final  . GFR, Est Non African American 04/05/2014 64   Final   Comment:                            The estimated GFR is a calculation valid for adults (>=75 years old)                          that uses the CKD-EPI algorithm to adjust for age and sex. It is                            not to be used for children, pregnant women, hospitalized patients,                             patients on dialysis, or with rapidly changing kidney function.                          According to the NKDEP, eGFR >89 is normal, 60-89 shows mild                          impairment, 30-59  shows moderate impairment, 15-29 shows severe                          impairment and <15 is ESRD.                             Marland Kitchen Cholesterol 04/05/2014 175  0 - 200 mg/dL Final   Comment: ATP III Classification:                                <  200        mg/dL        Desirable                               200 - 239     mg/dL        Borderline High                               >= 240        mg/dL        High                             . Triglycerides 04/05/2014 114  <150 mg/dL Final  . HDL 04/05/2014 51  >39 mg/dL Final  . Total CHOL/HDL Ratio 04/05/2014 3.4   Final  . VLDL 04/05/2014 23  0 - 40 mg/dL Final  . LDL Cholesterol 04/05/2014 101* 0 - 99 mg/dL Final   Comment:                            Total Cholesterol/HDL Ratio:CHD Risk                                                 Coronary Heart Disease Risk Table                                                                 Men       Women                                   1/2 Average Risk              3.4        3.3                                       Average Risk              5.0        4.4                                    2X Average Risk              9.6        7.1                                    3X Average Risk             23.4       11.0  Use the calculated Patient Ratio above and the CHD Risk table                           to determine the patient's CHD Risk.                          ATP III Classification (LDL):                                < 100        mg/dL         Optimal                               100 - 129     mg/dL         Near or Above Optimal                               130 - 159     mg/dL         Borderline High                               160 - 189     mg/dL         High                                > 190        mg/dL         Very High                               Past Medical History  Diagnosis Date  . Retinal detachment   . Hyperlipidemia   . Elevated PSA   .  Hypercholesteremia   . Seasonal allergies   . Prostate cancer     dx. prostate cancer after bx. 2'15-now surgery planned  . Arthritis     arthritis-generalized-knees, elbows   Past Surgical History  Procedure Laterality Date  . Knee arthroscopy Bilateral     open procedure  . Ganglion cyst excision Right   . Colonoscopy N/A 04/27/2013    Procedure: COLONOSCOPY;  Surgeon: Jamesetta So, MD;  Location: [facility];  Service: Gastroenterology;  Laterality: N/A;  . Tonsillectomy      child  . Tooth extraction      preparing for tooth implant  . Nasal septoplasty w/ turbinoplasty    . Robot assisted laparoscopic radical prostatectomy N/A 03/03/2014    Procedure: ROBOTIC ASSISTED LAPAROSCOPIC RADICAL PROSTATECTOMY LEVEL 2;  Surgeon: Raynelle Bring, MD;  Location: [facility];  Service: Urology;  Laterality: N/A;  . Lymphadenectomy Bilateral 03/03/2014    Procedure: LYMPHADENECTOMY;  Surgeon: Raynelle Bring, MD;  Location: [facility];  Service: Urology;  Laterality: Bilateral;   Current Outpatient Prescriptions on File Prior to Visit  Medication Sig Dispense Refill  . ciprofloxacin (CIPRO) 500 MG tablet Take 1 tablet (500 mg total) by mouth 2 (two) times daily. Start day prior to office visit for foley removal  6 tablet  0  . fexofenadine (ALLEGRA) 180  MG tablet Take 180 mg by mouth daily.      Marland Kitchen HYDROcodone-acetaminophen (NORCO) 5-325 MG per tablet Take 1-2 tablets by mouth every 6 (six) hours as needed.  30 tablet  0  . mometasone (NASONEX) 50 MCG/ACT nasal spray Place 2 sprays into the nose daily.      . pravastatin (PRAVACHOL) 20 MG tablet Take 2 tablets (40 mg total) by mouth daily.  60 tablet  5   No current facility-administered medications on file prior to visit.   Allergies  Allergen Reactions  . Demerol [Meperidine] Nausea And Vomiting   History   Social History  . Marital Status: Married    Spouse Name: N/A    Number of Children: N/A  . Years of Education: N/A   Occupational  History  . Not on file.   Social History Main Topics  . Smoking status: Former Smoker -- 0.50 packs/day for 15 years    Types: Cigarettes    Quit date: 04/27/1989  . Smokeless tobacco: Not on file  . Alcohol Use: 0.6 oz/week    1 Glasses of wine per week     Comment: moderate - wine every 3 rd day  . Drug Use: No  . Sexual Activity: Yes    Birth Control/ Protection: None   Other Topics Concern  . Not on file   Social History Narrative  . No narrative on file      Review of Systems  All other systems reviewed and are negative.      Objective:   Physical Exam  Vitals reviewed. Constitutional: He appears well-developed and well-nourished. No distress.  Neck: Neck supple. No JVD present. No thyromegaly present.  Cardiovascular: Normal rate, regular rhythm and normal heart sounds.   No murmur heard. Pulmonary/Chest: Effort normal and breath sounds normal. No respiratory distress. He has no wheezes. He has no rales.  Abdominal: Soft. Bowel sounds are normal. He exhibits no distension. There is no tenderness. There is no rebound and no guarding.  Musculoskeletal: He exhibits no edema.  Lymphadenopathy:    He has no cervical adenopathy.  Skin: He is not diaphoretic.          Assessment & Plan:  1. HLD (hyperlipidemia) Cholesterol is excellent. Decrease Pravachol 20 mg by mouth daily. Recheck fasting lipid panel in 6 months. I would recommend Prevnar 13 next year followed by Pneumovax 23 in 12 months.

## 2014-06-22 ENCOUNTER — Other Ambulatory Visit: Payer: Self-pay | Admitting: Family Medicine

## 2014-06-22 MED ORDER — DICLOFENAC SODIUM 75 MG PO TBEC: 75.0000 mg | DELAYED_RELEASE_TABLET | Freq: Every day | ORAL | Status: DC

## 2014-06-22 MED ORDER — MOMETASONE FUROATE 50 MCG/ACT NA SUSP: 2.0000 | Freq: Every day | NASAL | Status: DC

## 2014-06-22 NOTE — Telephone Encounter (Signed)
Med sent to pharm 

## 2014-07-22 ENCOUNTER — Ambulatory Visit: Payer: BC Managed Care – PPO | Admitting: Family Medicine

## 2014-08-02 ENCOUNTER — Ambulatory Visit (INDEPENDENT_AMBULATORY_CARE_PROVIDER_SITE_OTHER): Payer: BC Managed Care – PPO | Admitting: Urology

## 2014-08-02 DIAGNOSIS — C61 Malignant neoplasm of prostate: Secondary | ICD-10-CM

## 2014-10-10 ENCOUNTER — Other Ambulatory Visit: Payer: BC Managed Care – PPO

## 2014-10-10 DIAGNOSIS — Z0000 Encounter for general adult medical examination without abnormal findings: Secondary | ICD-10-CM

## 2014-10-10 DIAGNOSIS — E785 Hyperlipidemia, unspecified: Secondary | ICD-10-CM

## 2014-10-10 DIAGNOSIS — Z79899 Other long term (current) drug therapy: Secondary | ICD-10-CM

## 2014-10-10 LAB — LIPID PANEL
CHOL/HDL RATIO: 3.8 ratio
Cholesterol: 166 mg/dL (ref 0–200)
HDL: 44 mg/dL (ref >39)
LDL Cholesterol: 102 mg/dL — ABNORMAL HIGH (ref 0–99)
TRIGLYCERIDES: 99 mg/dL (ref <150)
VLDL: 20 mg/dL (ref 0–40)

## 2014-10-10 LAB — COMPLETE METABOLIC PANEL WITH GFR
ALBUMIN: 4.1 g/dL (ref 3.5–5.2)
ALT: 13 U/L (ref 0–53)
AST: 17 U/L (ref 0–37)
Alkaline Phosphatase: 68 U/L (ref 39–117)
BUN: 22 mg/dL (ref 6–23)
CALCIUM: 9.1 mg/dL (ref 8.4–10.5)
CHLORIDE: 105 meq/L (ref 96–112)
CO2: 25 mEq/L (ref 19–32)
Creat: 1.04 mg/dL (ref 0.50–1.35)
GFR, Est African American: 87 mL/min
GFR, Est Non African American: 76 mL/min
Glucose, Bld: 87 mg/dL (ref 70–99)
POTASSIUM: 4.3 meq/L (ref 3.5–5.3)
Sodium: 139 mEq/L (ref 135–145)
TOTAL PROTEIN: 6.3 g/dL (ref 6.0–8.3)
Total Bilirubin: 0.6 mg/dL (ref 0.2–1.2)

## 2014-10-10 LAB — CBC WITH DIFFERENTIAL/PLATELET
Basophils Absolute: 0 10*3/uL (ref 0.0–0.1)
Basophils Relative: 0 % (ref 0–1)
EOS ABS: 0.2 10*3/uL (ref 0.0–0.7)
Eosinophils Relative: 3 % (ref 0–5)
HCT: 45.9 % (ref 39.0–52.0)
Hemoglobin: 14.8 g/dL (ref 13.0–17.0)
LYMPHS ABS: 1.7 10*3/uL (ref 0.7–4.0)
Lymphocytes Relative: 33 % (ref 12–46)
MCH: 30.7 pg (ref 26.0–34.0)
MCHC: 32.2 g/dL (ref 30.0–36.0)
MCV: 95.2 fL (ref 78.0–100.0)
MONOS PCT: 6 % (ref 3–12)
MPV: 10.3 fL (ref 9.4–12.4)
Monocytes Absolute: 0.3 10*3/uL (ref 0.1–1.0)
NEUTROS PCT: 58 % (ref 43–77)
Neutro Abs: 3 10*3/uL (ref 1.7–7.7)
Platelets: 250 10*3/uL (ref 150–400)
RBC: 4.82 MIL/uL (ref 4.22–5.81)
RDW: 13.6 % (ref 11.5–15.5)
WBC: 5.1 10*3/uL (ref 4.0–10.5)

## 2014-10-10 LAB — TSH: TSH: 3.782 u[IU]/mL (ref 0.350–4.500)

## 2014-10-18 ENCOUNTER — Encounter: Payer: Self-pay | Admitting: Family Medicine

## 2014-10-18 ENCOUNTER — Ambulatory Visit (INDEPENDENT_AMBULATORY_CARE_PROVIDER_SITE_OTHER): Payer: BC Managed Care – PPO | Admitting: Family Medicine

## 2014-10-18 VITALS — BP 126/78 | HR 76 | Temp 98.4°F | Resp 18 | Ht 75.25 in | Wt 199.0 lb

## 2014-10-18 DIAGNOSIS — Z0000 Encounter for general adult medical examination without abnormal findings: Secondary | ICD-10-CM

## 2014-10-18 DIAGNOSIS — B070 Plantar wart: Secondary | ICD-10-CM

## 2014-10-18 MED ORDER — LORAZEPAM 1 MG PO TABS: 1.0000 mg | ORAL_TABLET | Freq: Two times a day (BID) | ORAL | Status: DC | PRN

## 2014-10-18 NOTE — Progress Notes (Signed)
Subjective:    Patient ID: Dwayne Young, male    DOB: 1950/12/14, 64 y.o.   MRN: [id number]  HPI Patient is here for CPE.  He also has a plantar wart on his left heel which is very painful.  He is requesting treatment.  His colonosocpy was performed 2014 and was nml.  His prostate is monitored with a PSA every 6 months at urology.  His immunizations are UTD He is due for pneumovax next year. Lab on 10/10/2014  Component Date Value Ref Range Status  . Sodium 10/10/2014 139  135 - 145 mEq/L Final  . Potassium 10/10/2014 4.3  3.5 - 5.3 mEq/L Final  . Chloride 10/10/2014 105  96 - 112 mEq/L Final  . CO2 10/10/2014 25  19 - 32 mEq/L Final  . Glucose, Bld 10/10/2014 87  70 - 99 mg/dL Final  . BUN 10/10/2014 22  6 - 23 mg/dL Final  . Creat 10/10/2014 1.04  0.50 - 1.35 mg/dL Final  . Total Bilirubin 10/10/2014 0.6  0.2 - 1.2 mg/dL Final  . Alkaline Phosphatase 10/10/2014 68  39 - 117 U/L Final  . AST 10/10/2014 17  0 - 37 U/L Final  . ALT 10/10/2014 13  0 - 53 U/L Final  . Total Protein 10/10/2014 6.3  6.0 - 8.3 g/dL Final  . Albumin 10/10/2014 4.1  3.5 - 5.2 g/dL Final  . Calcium 10/10/2014 9.1  8.4 - 10.5 mg/dL Final  . GFR, Est African American 10/10/2014 87   Final  . GFR, Est Non African American 10/10/2014 76   Final   Comment:   The estimated GFR is a calculation valid for adults (>=8 years old) that uses the CKD-EPI algorithm to adjust for age and sex. It is   not to be used for children, pregnant women, hospitalized patients,    patients on dialysis, or with rapidly changing kidney function. According to the NKDEP, eGFR >89 is normal, 60-89 shows mild impairment, 30-59 shows moderate impairment, 15-29 shows severe impairment and <15 is ESRD.     Marland Kitchen TSH 10/10/2014 3.782  0.350 - 4.500 uIU/mL Final  . Cholesterol 10/10/2014 166  0 - 200 mg/dL Final   Comment: ATP III Classification:       < 200        mg/dL        Desirable      200 - 239     mg/dL        Borderline High   >= 240        mg/dL        High     . Triglycerides 10/10/2014 99  <150 mg/dL Final  . HDL 10/10/2014 44  >39 mg/dL Final  . Total CHOL/HDL Ratio 10/10/2014 3.8   Final  . VLDL 10/10/2014 20  0 - 40 mg/dL Final  . LDL Cholesterol 10/10/2014 102* 0 - 99 mg/dL Final   Comment:   Total Cholesterol/HDL Ratio:CHD Risk                        Coronary Heart Disease Risk Table                                        Men       Women          1/2 Average Risk  3.4        3.3              Average Risk              5.0        4.4           2X Average Risk              9.6        7.1           3X Average Risk             23.4       11.0 Use the calculated Patient Ratio above and the CHD Risk table  to determine the patient's CHD Risk. ATP III Classification (LDL):       < 100        mg/dL         Optimal      100 - 129     mg/dL         Near or Above Optimal      130 - 159     mg/dL         Borderline High      160 - 189     mg/dL         High       > 190        mg/dL         Very High     . WBC 10/10/2014 5.1  4.0 - 10.5 K/uL Final  . RBC 10/10/2014 4.82  4.22 - 5.81 MIL/uL Final  . Hemoglobin 10/10/2014 14.8  13.0 - 17.0 g/dL Final  . HCT 10/10/2014 45.9  39.0 - 52.0 % Final  . MCV 10/10/2014 95.2  78.0 - 100.0 fL Final  . MCH 10/10/2014 30.7  26.0 - 34.0 pg Final  . MCHC 10/10/2014 32.2  30.0 - 36.0 g/dL Final  . RDW 10/10/2014 13.6  11.5 - 15.5 % Final  . Platelets 10/10/2014 250  150 - 400 K/uL Final  . MPV 10/10/2014 10.3  9.4 - 12.4 fL Final  . Neutrophils Relative % 10/10/2014 58  43 - 77 % Final  . Neutro Abs 10/10/2014 3.0  1.7 - 7.7 K/uL Final  . Lymphocytes Relative 10/10/2014 33  12 - 46 % Final  . Lymphs Abs 10/10/2014 1.7  0.7 - 4.0 K/uL Final  . Monocytes Relative 10/10/2014 6  3 - 12 % Final  . Monocytes Absolute 10/10/2014 0.3  0.1 - 1.0 K/uL Final  . Eosinophils Relative 10/10/2014 3  0 - 5 % Final  . Eosinophils Absolute 10/10/2014 0.2  0.0 - 0.7 K/uL  Final  . Basophils Relative 10/10/2014 0  0 - 1 % Final  . Basophils Absolute 10/10/2014 0.0  0.0 - 0.1 K/uL Final  . Smear Review 10/10/2014 Criteria for review not met   Final   Past Medical History  Diagnosis Date  . Retinal detachment   . Hyperlipidemia   . Elevated PSA   . Hypercholesteremia   . Seasonal allergies   . Prostate cancer     dx. prostate cancer after bx. 2'15-now surgery planned  . Arthritis     arthritis-generalized-knees, elbows   Past Surgical History  Procedure Laterality Date  . Knee arthroscopy Bilateral     open procedure  . Ganglion cyst excision Right   . Colonoscopy N/A 04/27/2013    Procedure: COLONOSCOPY;  Surgeon:  Jamesetta So, MD;  Location: [facility];  Service: Gastroenterology;  Laterality: N/A;  . Tonsillectomy      child  . Tooth extraction      preparing for tooth implant  . Nasal septoplasty w/ turbinoplasty    . Robot assisted laparoscopic radical prostatectomy N/A 03/03/2014    Procedure: ROBOTIC ASSISTED LAPAROSCOPIC RADICAL PROSTATECTOMY LEVEL 2;  Surgeon: Raynelle Bring, MD;  Location: [facility];  Service: Urology;  Laterality: N/A;  . Lymphadenectomy Bilateral 03/03/2014    Procedure: LYMPHADENECTOMY;  Surgeon: Raynelle Bring, MD;  Location: [facility];  Service: Urology;  Laterality: Bilateral;   Current Outpatient Prescriptions on File Prior to Visit  Medication Sig Dispense Refill  . diclofenac (VOLTAREN) 75 MG EC tablet Take 1 tablet (75 mg total) by mouth daily. 30 tablet 5  . fexofenadine (ALLEGRA) 180 MG tablet Take 180 mg by mouth daily.    . mometasone (NASONEX) 50 MCG/ACT nasal spray Place 2 sprays into the nose daily. 17 g 11  . pravastatin (PRAVACHOL) 20 MG tablet Take 2 tablets (40 mg total) by mouth daily. 60 tablet 5   No current facility-administered medications on file prior to visit.   Allergies  Allergen Reactions  . Demerol [Meperidine] Nausea And Vomiting   History   Social History  . Marital Status: Married      Spouse Name: N/A    Number of Children: N/A  . Years of Education: N/A   Occupational History  . Not on file.   Social History Main Topics  . Smoking status: Former Smoker -- 0.50 packs/day for 15 years    Types: Cigarettes    Quit date: 04/27/1989  . Smokeless tobacco: Never Used  . Alcohol Use: 0.6 oz/week    1 Glasses of wine per week     Comment: moderate - wine every 3 rd day  . Drug Use: No  . Sexual Activity: Yes    Birth Control/ Protection: None   Other Topics Concern  . Not on file   Social History Narrative   No family history on file.    Review of Systems  All other systems reviewed and are negative.      Objective:   Physical Exam  Constitutional: He is oriented to person, place, and time. He appears well-developed and well-nourished. No distress.  HENT:  Head: Normocephalic and atraumatic.  Right Ear: External ear normal.  Left Ear: External ear normal.  Nose: Nose normal.  Mouth/Throat: Oropharynx is clear and moist. No oropharyngeal exudate.  Eyes: Conjunctivae and EOM are normal. Pupils are equal, round, and reactive to light. Right eye exhibits no discharge. Left eye exhibits no discharge. No scleral icterus.  Neck: Normal range of motion. Neck supple. No JVD present. No tracheal deviation present. No thyromegaly present.  Cardiovascular: Normal rate, regular rhythm, normal heart sounds and intact distal pulses.  Exam reveals no gallop and no friction rub.   No murmur heard. Pulmonary/Chest: Effort normal and breath sounds normal. No stridor. No respiratory distress. He has no wheezes. He has no rales. He exhibits no tenderness.  Abdominal: Soft. Bowel sounds are normal. He exhibits no distension and no mass. There is no tenderness. There is no rebound and no guarding.  Musculoskeletal: Normal range of motion. He exhibits no edema or tenderness.  Lymphadenopathy:    He has no cervical adenopathy.  Neurological: He is alert and oriented to  person, place, and time. He has normal reflexes. He displays normal reflexes. No cranial  nerve deficit. He exhibits normal muscle tone. Coordination normal.  Skin: Skin is warm. No rash noted. He is not diaphoretic. No erythema. No pallor.  Psychiatric: He has a normal mood and affect. His behavior is normal. Judgment and thought content normal.  Vitals reviewed.         Assessment & Plan:  Routine general medical examination at a health care facility  Plantar wart of right foot  Labs excellent, d/c pravastatin and recheck in 6 months.  Cancer screening and immunizations are UTD.  Plantar wart was debrided with scapel and then treated with cryotherapy using liquid nitrogen for 30 seconds.  Regular anticipatory guidance was provided.

## 2014-12-28 ENCOUNTER — Other Ambulatory Visit: Payer: Self-pay | Admitting: Family Medicine

## 2014-12-28 MED ORDER — DICLOFENAC SODIUM 75 MG PO TBEC: 75.0000 mg | DELAYED_RELEASE_TABLET | Freq: Every day | ORAL | Status: DC

## 2014-12-28 NOTE — Telephone Encounter (Signed)
Med sent to pharm 

## 2015-01-31 ENCOUNTER — Ambulatory Visit (INDEPENDENT_AMBULATORY_CARE_PROVIDER_SITE_OTHER): Payer: BC Managed Care – PPO | Admitting: Urology

## 2015-01-31 DIAGNOSIS — C61 Malignant neoplasm of prostate: Secondary | ICD-10-CM | POA: Diagnosis not present

## 2015-04-14 ENCOUNTER — Other Ambulatory Visit: Payer: BC Managed Care – PPO

## 2015-04-14 DIAGNOSIS — Z79899 Other long term (current) drug therapy: Secondary | ICD-10-CM

## 2015-04-14 DIAGNOSIS — E785 Hyperlipidemia, unspecified: Secondary | ICD-10-CM

## 2015-04-14 LAB — COMPLETE METABOLIC PANEL WITH GFR
ALBUMIN: 3.7 g/dL (ref 3.5–5.2)
ALT: 10 U/L (ref 0–53)
AST: 13 U/L (ref 0–37)
Alkaline Phosphatase: 69 U/L (ref 39–117)
BILIRUBIN TOTAL: 0.5 mg/dL (ref 0.2–1.2)
BUN: 19 mg/dL (ref 6–23)
CHLORIDE: 103 meq/L (ref 96–112)
CO2: 27 mEq/L (ref 19–32)
Calcium: 8.6 mg/dL (ref 8.4–10.5)
Creat: 1.06 mg/dL (ref 0.50–1.35)
GFR, EST AFRICAN AMERICAN: 85 mL/min
GFR, EST NON AFRICAN AMERICAN: 73 mL/min
Glucose, Bld: 85 mg/dL (ref 70–99)
POTASSIUM: 4.2 meq/L (ref 3.5–5.3)
Sodium: 141 mEq/L (ref 135–145)
Total Protein: 6.2 g/dL (ref 6.0–8.3)

## 2015-04-14 LAB — LIPID PANEL
Cholesterol: 207 mg/dL — ABNORMAL HIGH (ref 0–200)
HDL: 48 mg/dL (ref >=40)
LDL Cholesterol: 130 mg/dL — ABNORMAL HIGH (ref 0–99)
TRIGLYCERIDES: 143 mg/dL (ref <150)
Total CHOL/HDL Ratio: 4.3 Ratio
VLDL: 29 mg/dL (ref 0–40)

## 2015-04-20 ENCOUNTER — Ambulatory Visit (INDEPENDENT_AMBULATORY_CARE_PROVIDER_SITE_OTHER): Payer: BC Managed Care – PPO | Admitting: Family Medicine

## 2015-04-20 ENCOUNTER — Encounter: Payer: Self-pay | Admitting: Family Medicine

## 2015-04-20 VITALS — BP 134/78 | HR 64 | Temp 97.6°F | Resp 14 | Ht 75.0 in | Wt 200.0 lb

## 2015-04-20 DIAGNOSIS — Z23 Encounter for immunization: Secondary | ICD-10-CM | POA: Diagnosis not present

## 2015-04-20 DIAGNOSIS — E785 Hyperlipidemia, unspecified: Secondary | ICD-10-CM | POA: Diagnosis not present

## 2015-04-20 MED ORDER — LORAZEPAM 1 MG PO TABS: 1.0000 mg | ORAL_TABLET | Freq: Two times a day (BID) | ORAL | Status: DC | PRN

## 2015-04-20 NOTE — Progress Notes (Signed)
Subjective:    Patient ID: Dwayne Young, male    DOB: 1951/09/01, 64 y.o.   MRN: [id number]  HPI Patient stops his cholesterol medicine after his last office visit. He has been off cholesterol medication now for several months. He has been trying to exercise and work on his diet. His most recent lab work as listed below. He is due today for Pneumovax 23. Lab on 04/14/2015  Component Date Value Ref Range Status  . Sodium 04/14/2015 141  135 - 145 mEq/L Final  . Potassium 04/14/2015 4.2  3.5 - 5.3 mEq/L Final  . Chloride 04/14/2015 103  96 - 112 mEq/L Final  . CO2 04/14/2015 27  19 - 32 mEq/L Final  . Glucose, Bld 04/14/2015 85  70 - 99 mg/dL Final  . BUN 04/14/2015 19  6 - 23 mg/dL Final  . Creat 04/14/2015 1.06  0.50 - 1.35 mg/dL Final  . Total Bilirubin 04/14/2015 0.5  0.2 - 1.2 mg/dL Final  . Alkaline Phosphatase 04/14/2015 69  39 - 117 U/L Final  . AST 04/14/2015 13  0 - 37 U/L Final  . ALT 04/14/2015 10  0 - 53 U/L Final  . Total Protein 04/14/2015 6.2  6.0 - 8.3 g/dL Final  . Albumin 04/14/2015 3.7  3.5 - 5.2 g/dL Final  . Calcium 04/14/2015 8.6  8.4 - 10.5 mg/dL Final  . GFR, Est African American 04/14/2015 85   Final  . GFR, Est Non African American 04/14/2015 73   Final   Comment:   The estimated GFR is a calculation valid for adults (>=8 years old) that uses the CKD-EPI algorithm to adjust for age and sex. It is   not to be used for children, pregnant women, hospitalized patients,    patients on dialysis, or with rapidly changing kidney function. According to the NKDEP, eGFR >89 is normal, 60-89 shows mild impairment, 30-59 shows moderate impairment, 15-29 shows severe impairment and <15 is ESRD.     Marland Kitchen Cholesterol 04/14/2015 207* 0 - 200 mg/dL Final   Comment: ATP III Classification:       < 200        mg/dL        Desirable      200 - 239     mg/dL        Borderline High      >= 240        mg/dL        High     . Triglycerides 04/14/2015 143  <150 mg/dL Final    . HDL 04/14/2015 48  >=40 mg/dL Final  . Total CHOL/HDL Ratio 04/14/2015 4.3   Final  . VLDL 04/14/2015 29  0 - 40 mg/dL Final  . LDL Cholesterol 04/14/2015 130* 0 - 99 mg/dL Final   Comment:   Total Cholesterol/HDL Ratio:CHD Risk                        Coronary Heart Disease Risk Table                                        Men       Women          1/2 Average Risk              3.4        3.3  Average Risk              5.0        4.4           2X Average Risk              9.6        7.1           3X Average Risk             23.4       11.0 Use the calculated Patient Ratio above and the CHD Risk table  to determine the patient's CHD Risk. ATP III Classification (LDL):       < 100        mg/dL         Optimal      100 - 129     mg/dL         Near or Above Optimal      130 - 159     mg/dL         Borderline High      160 - 189     mg/dL         High       > 190        mg/dL         Very High      Past Medical History  Diagnosis Date  . Retinal detachment   . Hyperlipidemia   . Elevated PSA   . Hypercholesteremia   . Seasonal allergies   . Prostate cancer     dx. prostate cancer after bx. 2'15-now surgery planned  . Arthritis     arthritis-generalized-knees, elbows   Past Surgical History  Procedure Laterality Date  . Knee arthroscopy Bilateral     open procedure  . Ganglion cyst excision Right   . Colonoscopy N/A 04/27/2013    Procedure: COLONOSCOPY;  Surgeon: Jamesetta So, MD;  Location: [facility];  Service: Gastroenterology;  Laterality: N/A;  . Tonsillectomy      child  . Tooth extraction      preparing for tooth implant  . Nasal septoplasty w/ turbinoplasty    . Robot assisted laparoscopic radical prostatectomy N/A 03/03/2014    Procedure: ROBOTIC ASSISTED LAPAROSCOPIC RADICAL PROSTATECTOMY LEVEL 2;  Surgeon: Raynelle Bring, MD;  Location: [facility];  Service: Urology;  Laterality: N/A;  . Lymphadenectomy Bilateral 03/03/2014    Procedure:  LYMPHADENECTOMY;  Surgeon: Raynelle Bring, MD;  Location: [facility];  Service: Urology;  Laterality: Bilateral;   Current Outpatient Prescriptions on File Prior to Visit  Medication Sig Dispense Refill  . diclofenac (VOLTAREN) 75 MG EC tablet Take 1 tablet (75 mg total) by mouth daily. 30 tablet 5  . mometasone (NASONEX) 50 MCG/ACT nasal spray Place 2 sprays into the nose daily. 17 g 11   No current facility-administered medications on file prior to visit.   Allergies  Allergen Reactions  . Demerol [Meperidine] Nausea And Vomiting   History   Social History  . Marital Status: Married    Spouse Name: N/A  . Number of Children: N/A  . Years of Education: N/A   Occupational History  . Not on file.   Social History Main Topics  . Smoking status: Former Smoker -- 0.50 packs/day for 15 years    Types: Cigarettes    Quit date: 04/27/1989  . Smokeless tobacco: Never Used  . Alcohol Use: 0.6 oz/week    1  Glasses of wine per week     Comment: moderate - wine every 3 rd day  . Drug Use: No  . Sexual Activity: Yes    Birth Control/ Protection: None   Other Topics Concern  . Not on file   Social History Narrative      Review of Systems  All other systems reviewed and are negative.      Objective:   Physical Exam  Cardiovascular: Normal rate, regular rhythm and normal heart sounds.   Pulmonary/Chest: Effort normal and breath sounds normal. No respiratory distress. He has no wheezes. He has no rales.  Abdominal: Soft. Bowel sounds are normal. He exhibits no distension. There is no tenderness. There is no rebound and no guarding.  Musculoskeletal: He exhibits no edema.  Vitals reviewed.         Assessment & Plan:  HLD (hyperlipidemia)  Need for prophylactic vaccination against Streptococcus pneumoniae (pneumococcus) - Plan: Pneumococcal polysaccharide vaccine 23-valent greater than or equal to 2yo subcutaneous/IM  Patient's cholesterol is adequately controlled even off  medication. I encouraged the patient to continue to work on his aerobic exercise and eat a low saturated fat diet. Also recommended the patient begin taking omega-3 fatty acids 2000 mg a day to help reduce his total cholesterol. Otherwise the patient is doing well with no concerns.

## 2015-07-12 ENCOUNTER — Other Ambulatory Visit: Payer: Self-pay | Admitting: Family Medicine

## 2015-07-12 MED ORDER — MOMETASONE FUROATE 50 MCG/ACT NA SUSP: 2.0000 | Freq: Every day | NASAL | Status: DC

## 2015-08-01 ENCOUNTER — Ambulatory Visit (INDEPENDENT_AMBULATORY_CARE_PROVIDER_SITE_OTHER): Payer: BC Managed Care – PPO | Admitting: Urology

## 2015-08-01 DIAGNOSIS — C61 Malignant neoplasm of prostate: Secondary | ICD-10-CM | POA: Diagnosis not present

## 2015-10-20 ENCOUNTER — Other Ambulatory Visit: Payer: BC Managed Care – PPO

## 2015-10-20 DIAGNOSIS — Z79899 Other long term (current) drug therapy: Secondary | ICD-10-CM

## 2015-10-20 DIAGNOSIS — E785 Hyperlipidemia, unspecified: Secondary | ICD-10-CM

## 2015-10-20 DIAGNOSIS — Z0000 Encounter for general adult medical examination without abnormal findings: Secondary | ICD-10-CM

## 2015-10-20 LAB — CBC WITH DIFFERENTIAL/PLATELET
Basophils Absolute: 0 10*3/uL (ref 0.0–0.1)
Basophils Relative: 0 % (ref 0–1)
Eosinophils Absolute: 0.1 10*3/uL (ref 0.0–0.7)
Eosinophils Relative: 2 % (ref 0–5)
HEMATOCRIT: 46.2 % (ref 39.0–52.0)
HEMOGLOBIN: 15.4 g/dL (ref 13.0–17.0)
LYMPHS ABS: 2 10*3/uL (ref 0.7–4.0)
LYMPHS PCT: 35 % (ref 12–46)
MCH: 30.9 pg (ref 26.0–34.0)
MCHC: 33.3 g/dL (ref 30.0–36.0)
MCV: 92.6 fL (ref 78.0–100.0)
MONOS PCT: 7 % (ref 3–12)
MPV: 10 fL (ref 8.6–12.4)
Monocytes Absolute: 0.4 10*3/uL (ref 0.1–1.0)
NEUTROS ABS: 3.2 10*3/uL (ref 1.7–7.7)
NEUTROS PCT: 56 % (ref 43–77)
Platelets: 284 10*3/uL (ref 150–400)
RBC: 4.99 MIL/uL (ref 4.22–5.81)
RDW: 13.8 % (ref 11.5–15.5)
WBC: 5.7 10*3/uL (ref 4.0–10.5)

## 2015-10-20 LAB — LIPID PANEL
Cholesterol: 209 mg/dL — ABNORMAL HIGH (ref 125–200)
HDL: 58 mg/dL (ref >=40)
LDL Cholesterol: 131 mg/dL — ABNORMAL HIGH (ref <130)
TRIGLYCERIDES: 100 mg/dL (ref <150)
Total CHOL/HDL Ratio: 3.6 Ratio (ref <=5.0)
VLDL: 20 mg/dL (ref <30)

## 2015-10-20 LAB — COMPLETE METABOLIC PANEL WITH GFR
ALT: 12 U/L (ref 9–46)
AST: 16 U/L (ref 10–35)
Albumin: 4 g/dL (ref 3.6–5.1)
Alkaline Phosphatase: 65 U/L (ref 40–115)
BILIRUBIN TOTAL: 0.5 mg/dL (ref 0.2–1.2)
BUN: 20 mg/dL (ref 7–25)
CO2: 28 mmol/L (ref 20–31)
Calcium: 9 mg/dL (ref 8.6–10.3)
Chloride: 107 mmol/L (ref 98–110)
Creat: 1.17 mg/dL (ref 0.70–1.25)
GFR, EST AFRICAN AMERICAN: 75 mL/min (ref >=60)
GFR, EST NON AFRICAN AMERICAN: 65 mL/min (ref >=60)
Glucose, Bld: 90 mg/dL (ref 70–99)
Potassium: 4.9 mmol/L (ref 3.5–5.3)
Sodium: 139 mmol/L (ref 135–146)
TOTAL PROTEIN: 6.4 g/dL (ref 6.1–8.1)

## 2015-10-20 LAB — TSH: TSH: 3.289 u[IU]/mL (ref 0.350–4.500)

## 2015-10-23 ENCOUNTER — Encounter: Payer: Self-pay | Admitting: Family Medicine

## 2015-10-27 ENCOUNTER — Ambulatory Visit (INDEPENDENT_AMBULATORY_CARE_PROVIDER_SITE_OTHER): Payer: BC Managed Care – PPO | Admitting: Family Medicine

## 2015-10-27 ENCOUNTER — Encounter: Payer: Self-pay | Admitting: Family Medicine

## 2015-10-27 VITALS — BP 122/80 | HR 72 | Temp 98.1°F | Resp 16 | Ht 75.0 in | Wt 203.0 lb

## 2015-10-27 DIAGNOSIS — E785 Hyperlipidemia, unspecified: Secondary | ICD-10-CM | POA: Diagnosis not present

## 2015-10-27 DIAGNOSIS — Z0000 Encounter for general adult medical examination without abnormal findings: Secondary | ICD-10-CM | POA: Diagnosis not present

## 2015-10-27 MED ORDER — LORAZEPAM 1 MG PO TABS: 1.0000 mg | ORAL_TABLET | Freq: Two times a day (BID) | ORAL | Status: DC | PRN

## 2015-10-27 MED ORDER — DICLOFENAC SODIUM 75 MG PO TBEC: 75.0000 mg | DELAYED_RELEASE_TABLET | Freq: Two times a day (BID) | ORAL | Status: DC

## 2015-10-27 MED ORDER — CIPROFLOXACIN HCL 500 MG PO TABS: 500.0000 mg | ORAL_TABLET | Freq: Two times a day (BID) | ORAL | Status: DC

## 2015-10-27 NOTE — Progress Notes (Signed)
Subjective:    Patient ID: Dwayne Young, male    DOB: April 23, 1951, 65 y.o.   MRN: [id number]  HPI 10/2014 Patient is here for CPE.  He also has a plantar wart on his left heel which is very painful.  He is requesting treatment.  His colonosocpy was performed 2014 and was nml.  His prostate is monitored with a PSA every 6 months at urology.  His immunizations are UTD He is due for pneumovax next year.  At that time, my plan was: Labs excellent, d/c pravastatin and recheck in 6 months.  Cancer screening and immunizations are UTD.  Plantar wart was debrided with scapel and then treated with cryotherapy using liquid nitrogen for 30 seconds.  Regular anticipatory guidance was provided.  10/27/15 Here today for his annual exam.  He would like a refill on his lorazepam which she uses sparingly only when he flies. He is planning on flying to [place]. He is also asking for refill of his Cipro which he takes them whenever he travels in case he gets traveler's diarrhea. He never uses it but he likes to keep the prescription updated. I will be glad to do both of these. He is using diclofenac once a day for arthritic pain in his knees and in his hips. He denies any stomach upset. He denies any chest pain. He denies any acid reflux. Pneumovax 23, his flu shot, shingles vaccine, and his tetanus vaccine are up-to-date. He is due for Prevnar 13 this year Lab on 10/20/2015  Component Date Value Ref Range Status  . Sodium 10/20/2015 139  135 - 146 mmol/L Final  . Potassium 10/20/2015 4.9  3.5 - 5.3 mmol/L Final  . Chloride 10/20/2015 107  98 - 110 mmol/L Final  . CO2 10/20/2015 28  20 - 31 mmol/L Final  . Glucose, Bld 10/20/2015 90  70 - 99 mg/dL Final  . BUN 10/20/2015 20  7 - 25 mg/dL Final  . Creat 10/20/2015 1.17  0.70 - 1.25 mg/dL Final  . Total Bilirubin 10/20/2015 0.5  0.2 - 1.2 mg/dL Final  . Alkaline Phosphatase 10/20/2015 65  40 - 115 U/L Final  . AST 10/20/2015 16  10 - 35 U/L Final  . ALT 10/20/2015  12  9 - 46 U/L Final  . Total Protein 10/20/2015 6.4  6.1 - 8.1 g/dL Final  . Albumin 10/20/2015 4.0  3.6 - 5.1 g/dL Final  . Calcium 10/20/2015 9.0  8.6 - 10.3 mg/dL Final  . GFR, Est African American 10/20/2015 75  >=60 mL/min Final  . GFR, Est Non African American 10/20/2015 65  >=60 mL/min Final   Comment:   The estimated GFR is a calculation valid for adults (>=49 years old) that uses the CKD-EPI algorithm to adjust for age and sex. It is   not to be used for children, pregnant women, hospitalized patients,    patients on dialysis, or with rapidly changing kidney function. According to the NKDEP, eGFR >89 is normal, 60-89 shows mild impairment, 30-59 shows moderate impairment, 15-29 shows severe impairment and <15 is ESRD.     . TSH 10/20/2015 3.289  0.350 - 4.500 uIU/mL Final  . Cholesterol 10/20/2015 209* 125 - 200 mg/dL Final  . Triglycerides 10/20/2015 100  <150 mg/dL Final  . HDL 10/20/2015 58  >=40 mg/dL Final  . Total CHOL/HDL Ratio 10/20/2015 3.6  <=5.0 Ratio Final  . VLDL 10/20/2015 20  <30 mg/dL Final  . LDL Cholesterol 10/20/2015 131* <130  mg/dL Final   Comment:   Total Cholesterol/HDL Ratio:CHD Risk                        Coronary Heart Disease Risk Table                                        Men       Women          1/2 Average Risk              3.4        3.3              Average Risk              5.0        4.4           2X Average Risk              9.6        7.1           3X Average Risk             23.4       11.0 Use the calculated Patient Ratio above and the CHD Risk table  to determine the patient's CHD Risk.   . WBC 10/20/2015 5.7  4.0 - 10.5 K/uL Final  . RBC 10/20/2015 4.99  4.22 - 5.81 MIL/uL Final  . Hemoglobin 10/20/2015 15.4  13.0 - 17.0 g/dL Final  . HCT 10/20/2015 46.2  39.0 - 52.0 % Final  . MCV 10/20/2015 92.6  78.0 - 100.0 fL Final  . MCH 10/20/2015 30.9  26.0 - 34.0 pg Final  . MCHC 10/20/2015 33.3  30.0 - 36.0 g/dL Final  . RDW  10/20/2015 13.8  11.5 - 15.5 % Final  . Platelets 10/20/2015 284  150 - 400 K/uL Final  . MPV 10/20/2015 10.0  8.6 - 12.4 fL Final  . Neutrophils Relative % 10/20/2015 56  43 - 77 % Final  . Neutro Abs 10/20/2015 3.2  1.7 - 7.7 K/uL Final  . Lymphocytes Relative 10/20/2015 35  12 - 46 % Final  . Lymphs Abs 10/20/2015 2.0  0.7 - 4.0 K/uL Final  . Monocytes Relative 10/20/2015 7  3 - 12 % Final  . Monocytes Absolute 10/20/2015 0.4  0.1 - 1.0 K/uL Final  . Eosinophils Relative 10/20/2015 2  0 - 5 % Final  . Eosinophils Absolute 10/20/2015 0.1  0.0 - 0.7 K/uL Final  . Basophils Relative 10/20/2015 0  0 - 1 % Final  . Basophils Absolute 10/20/2015 0.0  0.0 - 0.1 K/uL Final  . Smear Review 10/20/2015 Criteria for review not met   Final   Past Medical History  Diagnosis Date  . Retinal detachment   . Hyperlipidemia   . Elevated PSA   . Hypercholesteremia   . Seasonal allergies   . Prostate cancer     dx. prostate cancer after bx. 2'15-now surgery planned  . Arthritis     arthritis-generalized-knees, elbows   Past Surgical History  Procedure Laterality Date  . Knee arthroscopy Bilateral     open procedure  . Ganglion cyst excision Right   . Colonoscopy N/A 04/27/2013    Procedure: COLONOSCOPY;  Surgeon: Jamesetta So, MD;  Location: [facility];  Service: Gastroenterology;  Laterality: N/A;  .  Tonsillectomy      child  . Tooth extraction      preparing for tooth implant  . Nasal septoplasty w/ turbinoplasty    . Robot assisted laparoscopic radical prostatectomy N/A 03/03/2014    Procedure: ROBOTIC ASSISTED LAPAROSCOPIC RADICAL PROSTATECTOMY LEVEL 2;  Surgeon: Raynelle Bring, MD;  Location: [facility];  Service: Urology;  Laterality: N/A;  . Lymphadenectomy Bilateral 03/03/2014    Procedure: LYMPHADENECTOMY;  Surgeon: Raynelle Bring, MD;  Location: [facility];  Service: Urology;  Laterality: Bilateral;   Current Outpatient Prescriptions on File Prior to Visit  Medication Sig Dispense Refill   . LORazepam (ATIVAN) 1 MG tablet Take 1 tablet (1 mg total) by mouth 2 (two) times daily as needed for anxiety. 20 tablet 1  . mometasone (NASONEX) 50 MCG/ACT nasal spray Place 2 sprays into the nose daily. 17 g 11  . cetirizine (ZYRTEC) 10 MG tablet Take 10 mg by mouth daily. Reported on 10/27/2015     No current facility-administered medications on file prior to visit.   Allergies  Allergen Reactions  . Demerol [Meperidine] Nausea And Vomiting   Social History   Social History  . Marital Status: Married    Spouse Name: N/A  . Number of Children: N/A  . Years of Education: N/A   Occupational History  . Not on file.   Social History Main Topics  . Smoking status: Former Smoker -- 0.50 packs/day for 15 years    Types: Cigarettes    Quit date: 04/27/1989  . Smokeless tobacco: Never Used  . Alcohol Use: 0.6 oz/week    1 Glasses of wine per week     Comment: moderate - wine every 3 rd day  . Drug Use: No  . Sexual Activity: Yes    Birth Control/ Protection: None   Other Topics Concern  . Not on file   Social History Narrative   No family history on file.    Review of Systems  All other systems reviewed and are negative.      Objective:   Physical Exam  Constitutional: He is oriented to person, place, and time. He appears well-developed and well-nourished. No distress.  HENT:  Head: Normocephalic and atraumatic.  Right Ear: External ear normal.  Left Ear: External ear normal.  Nose: Nose normal.  Mouth/Throat: Oropharynx is clear and moist. No oropharyngeal exudate.  Eyes: Conjunctivae and EOM are normal. Pupils are equal, round, and reactive to light. Right eye exhibits no discharge. Left eye exhibits no discharge. No scleral icterus.  Neck: Normal range of motion. Neck supple. No JVD present. No tracheal deviation present. No thyromegaly present.  Cardiovascular: Normal rate, regular rhythm, normal heart sounds and intact distal pulses.  Exam reveals no gallop  and no friction rub.   No murmur heard. Pulmonary/Chest: Effort normal and breath sounds normal. No stridor. No respiratory distress. He has no wheezes. He has no rales. He exhibits no tenderness.  Abdominal: Soft. Bowel sounds are normal. He exhibits no distension and no mass. There is no tenderness. There is no rebound and no guarding.  Musculoskeletal: Normal range of motion. He exhibits no edema or tenderness.  Lymphadenopathy:    He has no cervical adenopathy.  Neurological: He is alert and oriented to person, place, and time. He has normal reflexes. No cranial nerve deficit. He exhibits normal muscle tone. Coordination normal.  Skin: Skin is warm. No rash noted. He is not diaphoretic. No erythema. No pallor.  Psychiatric: He has a normal mood  and affect. His behavior is normal. Judgment and thought content normal.  Vitals reviewed.         Assessment & Plan:  Routine general medical examination at a health care facility  HLD (hyperlipidemia)  labs are excellent. Remaining no changes in his medication. I will give him a prescription for lorazepam to use sparingly if needed. I will also give the patient prescription for Cipro. I refilled his diclofenac. Immunizations and cancer screening up-to-date. He would like to defer the Prevnar 13 the later this year.

## 2016-04-02 ENCOUNTER — Other Ambulatory Visit: Payer: BC Managed Care – PPO

## 2016-04-02 DIAGNOSIS — R972 Elevated prostate specific antigen [PSA]: Secondary | ICD-10-CM

## 2016-04-02 DIAGNOSIS — E785 Hyperlipidemia, unspecified: Secondary | ICD-10-CM

## 2016-04-02 DIAGNOSIS — Z79899 Other long term (current) drug therapy: Secondary | ICD-10-CM

## 2016-04-02 LAB — CBC WITH DIFFERENTIAL/PLATELET
BASOS PCT: 0 %
Basophils Absolute: 0 cells/uL (ref 0–200)
Eosinophils Absolute: 212 cells/uL (ref 15–500)
Eosinophils Relative: 4 %
HEMATOCRIT: 42.8 % (ref 38.5–50.0)
Hemoglobin: 14.3 g/dL (ref 13.0–17.0)
LYMPHS ABS: 1643 {cells}/uL (ref 850–3900)
Lymphocytes Relative: 31 %
MCH: 31 pg (ref 27.0–33.0)
MCHC: 33.4 g/dL (ref 32.0–36.0)
MCV: 92.8 fL (ref 80.0–100.0)
MONO ABS: 371 {cells}/uL (ref 200–950)
MPV: 9.6 fL (ref 7.5–12.5)
Monocytes Relative: 7 %
NEUTROS ABS: 3074 {cells}/uL (ref 1500–7800)
Neutrophils Relative %: 58 %
Platelets: 237 10*3/uL (ref 140–400)
RBC: 4.61 MIL/uL (ref 4.20–5.80)
RDW: 13.8 % (ref 11.0–15.0)
WBC: 5.3 10*3/uL (ref 3.8–10.8)

## 2016-04-02 LAB — COMPLETE METABOLIC PANEL WITH GFR
ALK PHOS: 62 U/L (ref 40–115)
ALT: 14 U/L (ref 9–46)
AST: 20 U/L (ref 10–35)
Albumin: 4.1 g/dL (ref 3.6–5.1)
BILIRUBIN TOTAL: 0.6 mg/dL (ref 0.2–1.2)
BUN: 23 mg/dL (ref 7–25)
CO2: 25 mmol/L (ref 20–31)
Calcium: 8.7 mg/dL (ref 8.6–10.3)
Chloride: 106 mmol/L (ref 98–110)
Creat: 1.03 mg/dL (ref 0.70–1.25)
GFR, EST AFRICAN AMERICAN: 87 mL/min (ref >=60)
GFR, Est Non African American: 75 mL/min (ref >=60)
Glucose, Bld: 92 mg/dL (ref 70–99)
Potassium: 4.3 mmol/L (ref 3.5–5.3)
Sodium: 135 mmol/L (ref 135–146)
TOTAL PROTEIN: 6.3 g/dL (ref 6.1–8.1)

## 2016-04-02 LAB — LIPID PANEL
Cholesterol: 207 mg/dL — ABNORMAL HIGH (ref 125–200)
HDL: 53 mg/dL (ref >=40)
LDL Cholesterol: 132 mg/dL — ABNORMAL HIGH (ref <130)
TRIGLYCERIDES: 112 mg/dL (ref <150)
Total CHOL/HDL Ratio: 3.9 Ratio (ref <=5.0)
VLDL: 22 mg/dL (ref <30)

## 2016-04-03 LAB — PSA: PSA: <0.01 ng/mL (ref <=4.00)

## 2016-04-04 ENCOUNTER — Ambulatory Visit (INDEPENDENT_AMBULATORY_CARE_PROVIDER_SITE_OTHER): Payer: BC Managed Care – PPO | Admitting: Family Medicine

## 2016-04-04 ENCOUNTER — Encounter: Payer: Self-pay | Admitting: Family Medicine

## 2016-04-04 VITALS — BP 130/74 | HR 78 | Temp 98.3°F | Resp 18 | Ht 75.0 in | Wt 206.0 lb

## 2016-04-04 DIAGNOSIS — R05 Cough: Secondary | ICD-10-CM

## 2016-04-04 DIAGNOSIS — E785 Hyperlipidemia, unspecified: Secondary | ICD-10-CM

## 2016-04-04 DIAGNOSIS — R059 Cough, unspecified: Secondary | ICD-10-CM

## 2016-04-04 DIAGNOSIS — Z23 Encounter for immunization: Secondary | ICD-10-CM

## 2016-04-04 DIAGNOSIS — M1990 Unspecified osteoarthritis, unspecified site: Secondary | ICD-10-CM | POA: Diagnosis not present

## 2016-04-04 MED ORDER — CELECOXIB 200 MG PO CAPS: 200.0000 mg | ORAL_CAPSULE | Freq: Two times a day (BID) | ORAL | Status: DC

## 2016-04-04 NOTE — Progress Notes (Signed)
Subjective:    Patient ID: Dwayne Young, male    DOB: 1950/12/06, 65 y.o.   MRN: [id number]  HPI 10/2014 Patient is here for CPE.  He also has a plantar wart on his left heel which is very painful.  He is requesting treatment.  His colonosocpy was performed 2014 and was nml.  His prostate is monitored with a PSA every 6 months at urology.  His immunizations are UTD He is due for pneumovax next year.  At that time, my plan was: Labs excellent, d/c pravastatin and recheck in 6 months.  Cancer screening and immunizations are UTD.  Plantar wart was debrided with scapel and then treated with cryotherapy using liquid nitrogen for 30 seconds.  Regular anticipatory guidance was provided.  10/27/15 Here today for his annual exam.  He would like a refill on his lorazepam which she uses sparingly only when he flies. He is planning on flying to [place]. He is also asking for refill of his Cipro which he takes them whenever he travels in case he gets traveler's diarrhea. He never uses it but he likes to keep the prescription updated. I will be glad to do both of these. He is using diclofenac once a day for arthritic pain in his knees and in his hips. He denies any stomach upset. He denies any chest pain. He denies any acid reflux. Pneumovax 23, his flu shot, shingles vaccine, and his tetanus vaccine are up-to-date. He is due for Prevnar 13 this year.  At that time, my plan was: labs are excellent. I will make no changes in his medication. I will give him a prescription for lorazepam to use sparingly if needed. I will also give the patient prescription for Cipro. I refilled his diclofenac. Immunizations and cancer screening up-to-date. He would like to defer the Prevnar 13 the later this year.  04/04/16 Here for follow up.  Labs are listed below: Lab on 04/02/2016  Component Date Value Ref Range Status  . PSA 04/02/2016 <0.01  <=4.00 ng/mL Final   Comment: Result repeated and verified. Test Methodology: ECLIA  PSA (Electrochemiluminescence Immunoassay)   For PSA values from 2.5-4.0, particularly in younger men <75 years old, the AUA and NCCN suggest testing for % Free PSA (3515) and evaluation of the rate of increase in PSA (PSA velocity).   . WBC 04/02/2016 5.3  3.8 - 10.8 K/uL Final  . RBC 04/02/2016 4.61  4.20 - 5.80 MIL/uL Final  . Hemoglobin 04/02/2016 14.3  13.0 - 17.0 g/dL Final  . HCT 04/02/2016 42.8  38.5 - 50.0 % Final  . MCV 04/02/2016 92.8  80.0 - 100.0 fL Final  . MCH 04/02/2016 31.0  27.0 - 33.0 pg Final  . MCHC 04/02/2016 33.4  32.0 - 36.0 g/dL Final  . RDW 04/02/2016 13.8  11.0 - 15.0 % Final  . Platelets 04/02/2016 237  140 - 400 K/uL Final  . MPV 04/02/2016 9.6  7.5 - 12.5 fL Final  . Neutro Abs 04/02/2016 3074  1500 - 7800 cells/uL Final  . Lymphs Abs 04/02/2016 1643  850 - 3900 cells/uL Final  . Monocytes Absolute 04/02/2016 371  200 - 950 cells/uL Final  . Eosinophils Absolute 04/02/2016 212  15 - 500 cells/uL Final  . Basophils Absolute 04/02/2016 0  0 - 200 cells/uL Final  . Neutrophils Relative % 04/02/2016 58   Final  . Lymphocytes Relative 04/02/2016 31   Final  . Monocytes Relative 04/02/2016 7   Final  .  Eosinophils Relative 04/02/2016 4   Final  . Basophils Relative 04/02/2016 0   Final  . Smear Review 04/02/2016 Criteria for review not met   Final   ** Please note change in unit of measure and reference range(s). **  . Cholesterol 04/02/2016 207* 125 - 200 mg/dL Final  . Triglycerides 04/02/2016 112  <150 mg/dL Final  . HDL 04/02/2016 53  >=40 mg/dL Final  . Total CHOL/HDL Ratio 04/02/2016 3.9  <=5.0 Ratio Final  . VLDL 04/02/2016 22  <30 mg/dL Final  . LDL Cholesterol 04/02/2016 132* <130 mg/dL Final   Comment:   Total Cholesterol/HDL Ratio:CHD Risk                        Coronary Heart Disease Risk Table                                        Men       Women          1/2 Average Risk              3.4        3.3              Average Risk               5.0        4.4           2X Average Risk              9.6        7.1           3X Average Risk             23.4       11.0 Use the calculated Patient Ratio above and the CHD Risk table  to determine the patient's CHD Risk.   . Sodium 04/02/2016 135  135 - 146 mmol/L Final  . Potassium 04/02/2016 4.3  3.5 - 5.3 mmol/L Final  . Chloride 04/02/2016 106  98 - 110 mmol/L Final  . CO2 04/02/2016 25  20 - 31 mmol/L Final  . Glucose, Bld 04/02/2016 92  70 - 99 mg/dL Final  . BUN 04/02/2016 23  7 - 25 mg/dL Final  . Creat 04/02/2016 1.03  0.70 - 1.25 mg/dL Final   Comment:   For patients > or = 66 years of age: The upper reference limit for Creatinine is approximately 13% higher for people identified as African-American.     . Total Bilirubin 04/02/2016 0.6  0.2 - 1.2 mg/dL Final  . Alkaline Phosphatase 04/02/2016 62  40 - 115 U/L Final  . AST 04/02/2016 20  10 - 35 U/L Final  . ALT 04/02/2016 14  9 - 46 U/L Final  . Total Protein 04/02/2016 6.3  6.1 - 8.1 g/dL Final  . Albumin 04/02/2016 4.1  3.6 - 5.1 g/dL Final  . Calcium 04/02/2016 8.7  8.6 - 10.3 mg/dL Final  . GFR, Est African American 04/02/2016 87  >=60 mL/min Final  . GFR, Est Non African American 04/02/2016 75  >=60 mL/min Final   Past Medical History  Diagnosis Date  . Retinal detachment   . Hyperlipidemia   . Elevated PSA   . Hypercholesteremia   . Seasonal allergies   . Prostate cancer ([place])     dx. prostate  cancer after bx. 2'15-now surgery planned  . Arthritis     arthritis-generalized-knees, elbows   Past Surgical History  Procedure Laterality Date  . Knee arthroscopy Bilateral     open procedure  . Ganglion cyst excision Right   . Colonoscopy N/A 04/27/2013    Procedure: COLONOSCOPY;  Surgeon: Jamesetta So, MD;  Location: [facility];  Service: Gastroenterology;  Laterality: N/A;  . Tonsillectomy      child  . Tooth extraction      preparing for tooth implant  . Nasal septoplasty w/ turbinoplasty    .  Robot assisted laparoscopic radical prostatectomy N/A 03/03/2014    Procedure: ROBOTIC ASSISTED LAPAROSCOPIC RADICAL PROSTATECTOMY LEVEL 2;  Surgeon: Raynelle Bring, MD;  Location: [facility];  Service: Urology;  Laterality: N/A;  . Lymphadenectomy Bilateral 03/03/2014    Procedure: LYMPHADENECTOMY;  Surgeon: Raynelle Bring, MD;  Location: [facility];  Service: Urology;  Laterality: Bilateral;   Current Outpatient Prescriptions on File Prior to Visit  Medication Sig Dispense Refill  . aspirin 81 MG tablet Take 81 mg by mouth daily.    . cetirizine (ZYRTEC) 10 MG tablet Take 10 mg by mouth daily. Reported on 10/27/2015    . ciprofloxacin (CIPRO) 500 MG tablet Take 1 tablet (500 mg total) by mouth 2 (two) times daily. 20 tablet 0  . diclofenac (VOLTAREN) 75 MG EC tablet Take 1 tablet (75 mg total) by mouth 2 (two) times daily. 30 tablet 11  . LORazepam (ATIVAN) 1 MG tablet Take 1 tablet (1 mg total) by mouth 2 (two) times daily as needed for anxiety. 20 tablet 1  . mometasone (NASONEX) 50 MCG/ACT nasal spray Place 2 sprays into the nose daily. 17 g 11   No current facility-administered medications on file prior to visit.   Allergies  Allergen Reactions  . Demerol [Meperidine] Nausea And Vomiting   Social History   Social History  . Marital Status: Married    Spouse Name: N/A  . Number of Children: N/A  . Years of Education: N/A   Occupational History  . Not on file.   Social History Main Topics  . Smoking status: Former Smoker -- 0.50 packs/day for 15 years    Types: Cigarettes    Quit date: 04/27/1989  . Smokeless tobacco: Never Used  . Alcohol Use: 0.6 oz/week    1 Glasses of wine per week     Comment: moderate - wine every 3 rd day  . Drug Use: No  . Sexual Activity: Yes    Birth Control/ Protection: None   Other Topics Concern  . Not on file   Social History Narrative   No family history on file.    Review of Systems  All other systems reviewed and are negative.        Objective:   Physical Exam  Constitutional: He is oriented to person, place, and time. He appears well-developed and well-nourished. No distress.  HENT:  Head: Normocephalic and atraumatic.  Right Ear: External ear normal.  Left Ear: External ear normal.  Nose: Nose normal.  Mouth/Throat: Oropharynx is clear and moist. No oropharyngeal exudate.  Eyes: Conjunctivae and EOM are normal. Pupils are equal, round, and reactive to light. Right eye exhibits no discharge. Left eye exhibits no discharge. No scleral icterus.  Neck: Normal range of motion. Neck supple. No JVD present. No tracheal deviation present. No thyromegaly present.  Cardiovascular: Normal rate, regular rhythm, normal heart sounds and intact distal pulses.  Exam reveals no  gallop and no friction rub.   No murmur heard. Pulmonary/Chest: Effort normal and breath sounds normal. No stridor. No respiratory distress. He has no wheezes. He has no rales. He exhibits no tenderness.  Abdominal: Soft. Bowel sounds are normal. He exhibits no distension and no mass. There is no tenderness. There is no rebound and no guarding.  Musculoskeletal: Normal range of motion. He exhibits no edema or tenderness.  Lymphadenopathy:    He has no cervical adenopathy.  Neurological: He is alert and oriented to person, place, and time. He has normal reflexes. No cranial nerve deficit. He exhibits normal muscle tone. Coordination normal.  Skin: Skin is warm. No rash noted. He is not diaphoretic. No erythema. No pallor.  Psychiatric: He has a normal mood and affect. His behavior is normal. Judgment and thought content normal.  Vitals reviewed.         Assessment & Plan:   HLD (hyperlipidemia)  Arthritis - Plan: celecoxib (CELEBREX) 200 MG capsule  Cough - Plan: DG Chest 2 View  Need for prophylactic vaccination against Streptococcus pneumoniae (pneumococcus) - Plan: Pneumococcal conjugate vaccine 13-valent IM  Overall he is doing very well with  no concerns. He does complain of some arthritis. He is taking diclofenac on a daily basis. I recommended discontinuing diclofenac and switching to Celebrex 200 mg twice daily to decrease the risk of gastrointestinal side effects. He also reports an occasional cough. Given his history of smoking, I will check a chest x-ray. He received Prevnar 13 today. Cholesterol is acceptable. Recheck annually at a physical

## 2016-04-08 ENCOUNTER — Ambulatory Visit
Admission: RE | Admit: 2016-04-08 | Discharge: 2016-04-08 | Disposition: A | Discharge status: Home / Self Care | Payer: BC Managed Care – PPO | Source: Ambulatory Visit | Attending: Family Medicine | Admitting: Family Medicine

## 2016-04-08 DIAGNOSIS — R059 Cough, unspecified: Secondary | ICD-10-CM

## 2016-04-08 DIAGNOSIS — R05 Cough: Secondary | ICD-10-CM

## 2016-04-09 ENCOUNTER — Encounter: Payer: Self-pay | Admitting: Family Medicine

## 2016-04-15 ENCOUNTER — Ambulatory Visit (INDEPENDENT_AMBULATORY_CARE_PROVIDER_SITE_OTHER): Payer: BC Managed Care – PPO | Admitting: Family Medicine

## 2016-04-15 ENCOUNTER — Encounter: Payer: Self-pay | Admitting: Family Medicine

## 2016-04-15 VITALS — BP 128/78 | HR 68 | Temp 98.8°F | Resp 14 | Ht 75.0 in | Wt 203.0 lb

## 2016-04-15 DIAGNOSIS — S83207A Unspecified tear of unspecified meniscus, current injury, left knee, initial encounter: Secondary | ICD-10-CM

## 2016-04-15 DIAGNOSIS — M1712 Unilateral primary osteoarthritis, left knee: Secondary | ICD-10-CM | POA: Diagnosis not present

## 2016-04-15 NOTE — Progress Notes (Signed)
Patient ID: Dwayne Young, male   DOB: 1951-04-09, 65 y.o.   MRN: [id number]    Subjective:    Patient ID: Dwayne Young, male    DOB: [birth year], 65 y.o.   MRN: [id number]  Patient presents for L Knee Pain    Pt here with  left knee pain for the past month. He has history of multiple knee injuries. He has known osteoarthritis. In the 1980s he had a surgery done secondary to [place] slot or in complications where he had some boring done into the tibial tuberosity to help with blood flow. About 15 years later he had meniscal tear and had arthroscopic done on the left knee he also had this done on the right knee. He tries to stay very active he does low resistance exercises for his knees mostly recumbent bike. The past month he's noted more pain on the medial aspect in the inferior knee he's had mild swelling. He remembers hitting knee trying to get up into car, but no other abnormal twisting motions He has not had any giving out or locking of the knee. He does take Celebrex but this has not helped very much she's been alternating Aspercreme and diclofenac gel which does give him some relief. This pain feels similar when he had his previous meniscal tears.    Review Of Systems:  GEN- denies fatigue, fever, weight loss,weakness, recent illness HEENT- denies eye drainage, change in vision, nasal discharge, CVS- denies chest pain, palpitations RESP- denies SOB, cough, wheeze ABD- denies N/V, change in stools, abd pain GU- denies dysuria, hematuria, dribbling, incontinence MSK- + joint pain, muscle aches, injury Neuro- denies headache, dizziness, syncope, seizure activity       Objective:    BP 128/78 mmHg  Pulse 68  Temp(Src) 98.8 F (37.1 C) (Oral)  Resp 14  Ht 6\' 3"  (1.905 m)  Wt 203 lb (92.08 kg)  BMI 25.37 kg/m2 GEN- NAD, alert and oriented x3 MSK- bilat knee - no effusion- fair ROM bilat, pain with extension of left knee- ligaments gross in tact, bilat crepitus, pain with twisting of  knee , good ROM Left ankle, fair ROM right ankle  Non antalgic gait  Ext- no edema         Assessment & Plan:      Problem List Items Addressed This Visit    None    Visit Diagnoses    Primary osteoarthritis of left knee    -  Primary    Acute meniscal tear of knee, left, initial encounter        Known OA, concern for recurrent tear on knee, though no particular injury. Needs imaging with previous surgery on knee, obtain MRI, contiue celebrex, add tylenol. Okay to use topicals as needed       Note: This dictation was prepared with Dragon dictation along with smaller phrase technology. Any transcriptional errors that result from this process are unintentional.

## 2016-04-15 NOTE — Patient Instructions (Signed)
MRI of knee to be done Take TYlenol 650MG  between Celebrex F/U pending results

## 2016-05-03 ENCOUNTER — Telehealth: Payer: Self-pay | Admitting: Family Medicine

## 2016-05-03 NOTE — Telephone Encounter (Signed)
MRI of knee was denied by [employer].  Pt advised and wishes to see Orthopedist.  Requested Dr Wynelle Link at [facility].  Referral was sent to them and pt has appt on 07/11/16 at 8:45 AM.

## 2016-07-01 ENCOUNTER — Ambulatory Visit (INDEPENDENT_AMBULATORY_CARE_PROVIDER_SITE_OTHER): Payer: BC Managed Care – PPO | Admitting: Family Medicine

## 2016-07-01 VITALS — BP 180/94 | HR 96 | Temp 98.0°F | Resp 18 | Wt 204.0 lb

## 2016-07-01 DIAGNOSIS — J029 Acute pharyngitis, unspecified: Secondary | ICD-10-CM

## 2016-07-01 LAB — STREP GROUP A AG, W/REFLEX TO CULT: STREGTOCOCCUS GROUP A AG SCREEN: NOT DETECTED

## 2016-07-01 NOTE — Progress Notes (Signed)
Subjective:    Patient ID: Dwayne Young, male    DOB: 1951-05-20, 65 y.o.   MRN: [id number]  HPI Patient is here today complaining of a mild sore throat that has been present for approximately 4 weeks. The pain seems to be located in the right side of his throat just below the angle of the mandible. However he states that with deep inspiration and with exercise, the pain will radiate down his throat into his upper airways. He denies any fevers or chills. He denies any hemoptysis. He denies any hematemesis. He denies any rhinorrhea. There is no lymphadenopathy in the neck. There is no thyromegaly. He denies any dysphagia or odynophagia. He denies any stridor. He does have a remote history of smoking. He denies any weight loss. He denies any acid reflux. He denies any postnasal drip. His exam today is completely unremarkable. Past Medical History:  Diagnosis Date  . Arthritis    arthritis-generalized-knees, elbows  . Elevated PSA   . Hypercholesteremia   . Hyperlipidemia   . Prostate cancer ([place])    dx. prostate cancer after bx. 2'15-now surgery planned  . Retinal detachment   . Seasonal allergies    Past Surgical History:  Procedure Laterality Date  . COLONOSCOPY N/A 04/27/2013   Procedure: COLONOSCOPY;  Surgeon: Jamesetta So, MD;  [facility];  Service: Gastroenterology;  Laterality: N/A;  . GANGLION CYST EXCISION Right   . KNEE ARTHROSCOPY Bilateral    open procedure  . LYMPHADENECTOMY Bilateral 03/03/2014   Procedure: LYMPHADENECTOMY;  Surgeon: Raynelle Bring, MD;  Location: [facility];  Service: Urology;  Laterality: Bilateral;  . NASAL SEPTOPLASTY W/ TURBINOPLASTY    . ROBOT ASSISTED LAPAROSCOPIC RADICAL PROSTATECTOMY N/A 03/03/2014   Procedure: ROBOTIC ASSISTED LAPAROSCOPIC RADICAL PROSTATECTOMY LEVEL 2;  Surgeon: Raynelle Bring, MD;  Location: [facility];  Service: Urology;  Laterality: N/A;  . TONSILLECTOMY     child  . TOOTH EXTRACTION     preparing for tooth implant    Current Outpatient Prescriptions on File Prior to Visit  Medication Sig Dispense Refill  . aspirin 81 MG tablet Take 81 mg by mouth daily.    . celecoxib (CELEBREX) 200 MG capsule Take 1 capsule (200 mg total) by mouth 2 (two) times daily. (Patient not taking: Reported on 07/01/2016) 180 capsule 3  . cromolyn (NASALCROM) 5.2 MG/ACT nasal spray Place 1 spray into both nostrils 4 (four) times daily.    Marland Kitchen desmopressin (DDAVP) 0.2 MG tablet Take 0.2 mg by mouth at bedtime.   1  . LORazepam (ATIVAN) 1 MG tablet Take 1 tablet (1 mg total) by mouth 2 (two) times daily as needed for anxiety. 20 tablet 1  . Misc Natural Products (BLACK CHERRY CONCENTRATE PO) Take by mouth.    Marland Kitchen OVER THE COUNTER MEDICATION      No current facility-administered medications on file prior to visit.    Allergies  Allergen Reactions  . Demerol [Meperidine] Nausea And Vomiting   Social History   Social History  . Marital status: Married    Spouse name: N/A  . Number of children: N/A  . Years of education: N/A   Occupational History  . Not on file.   Social History Main Topics  . Smoking status: Former Smoker    Packs/day: 0.50    Years: 15.00    Types: Cigarettes    Quit date: 04/27/1989  . Smokeless tobacco: Never Used  . Alcohol use 0.6 oz/week    1  Glasses of wine per week     Comment: moderate - wine every 3 rd day  . Drug use: No  . Sexual activity: Yes    Birth control/ protection: None   Other Topics Concern  . Not on file   Social History Narrative  . No narrative on file      Review of Systems  All other systems reviewed and are negative.      Objective:   Physical Exam  Constitutional: He appears well-developed and well-nourished. No distress.  HENT:  Head: Normocephalic and atraumatic.  Right Ear: External ear normal.  Left Ear: External ear normal.  Nose: Nose normal.  Mouth/Throat: Oropharynx is clear and moist. No oropharyngeal exudate.  Eyes: Conjunctivae and EOM are  normal. Pupils are equal, round, and reactive to light. No scleral icterus.  Neck: Normal range of motion. Neck supple. No thyromegaly present.  Cardiovascular: Normal rate, regular rhythm and normal heart sounds.   No murmur heard. Pulmonary/Chest: Effort normal and breath sounds normal. No respiratory distress. He has no wheezes. He has no rales.  Abdominal: Soft. Bowel sounds are normal.  Lymphadenopathy:    He has no cervical adenopathy.  Skin: He is not diaphoretic.  Vitals reviewed.         Assessment & Plan:  Sore throat - Plan: STREP GROUP A AG, W/REFLEX TO CULT  Patient's exam today is completely normal aside from his elevated blood pressure. However he was rushing to get here this morning. His history provides no clue as to the cause. There could be some irritation in his upper throat causing a sore throat. However given the length of time that this is been present, I would recommend an ENT consultation for laryngoscopy to rule out any pathologic lesions given the persistence for more than 4 weeks with no other symptoms and no other explanation. I will consult ENT.

## 2016-07-03 ENCOUNTER — Ambulatory Visit: Payer: BC Managed Care – PPO | Admitting: Family Medicine

## 2016-07-03 LAB — CULTURE, GROUP A STREP: Organism ID, Bacteria: NORMAL

## 2016-08-06 DIAGNOSIS — G44219 Episodic tension-type headache, not intractable: Secondary | ICD-10-CM | POA: Insufficient documentation

## 2016-08-06 DIAGNOSIS — H9313 Tinnitus, bilateral: Secondary | ICD-10-CM | POA: Insufficient documentation

## 2016-08-06 DIAGNOSIS — H8110 Benign paroxysmal vertigo, unspecified ear: Secondary | ICD-10-CM | POA: Insufficient documentation

## 2016-08-13 ENCOUNTER — Ambulatory Visit (INDEPENDENT_AMBULATORY_CARE_PROVIDER_SITE_OTHER): Payer: BC Managed Care – PPO | Admitting: Urology

## 2016-08-13 DIAGNOSIS — C61 Malignant neoplasm of prostate: Secondary | ICD-10-CM | POA: Diagnosis not present

## 2017-02-25 ENCOUNTER — Other Ambulatory Visit: Payer: Self-pay | Admitting: Family Medicine

## 2017-02-26 ENCOUNTER — Other Ambulatory Visit: Payer: BC Managed Care – PPO

## 2017-02-26 DIAGNOSIS — R972 Elevated prostate specific antigen [PSA]: Secondary | ICD-10-CM

## 2017-02-26 DIAGNOSIS — Z79899 Other long term (current) drug therapy: Secondary | ICD-10-CM

## 2017-02-26 DIAGNOSIS — Z0000 Encounter for general adult medical examination without abnormal findings: Secondary | ICD-10-CM

## 2017-02-26 DIAGNOSIS — E785 Hyperlipidemia, unspecified: Secondary | ICD-10-CM

## 2017-02-26 LAB — CBC WITH DIFFERENTIAL/PLATELET
BASOS PCT: 0 %
Basophils Absolute: 0 cells/uL (ref 0–200)
EOS PCT: 2 %
Eosinophils Absolute: 122 cells/uL (ref 15–500)
HCT: 44.6 % (ref 38.5–50.0)
HEMOGLOBIN: 14.7 g/dL (ref 13.0–17.0)
LYMPHS ABS: 1891 {cells}/uL (ref 850–3900)
Lymphocytes Relative: 31 %
MCH: 30.8 pg (ref 27.0–33.0)
MCHC: 33 g/dL (ref 32.0–36.0)
MCV: 93.3 fL (ref 80.0–100.0)
MPV: 9.4 fL (ref 7.5–12.5)
Monocytes Absolute: 427 cells/uL (ref 200–950)
Monocytes Relative: 7 %
NEUTROS ABS: 3660 {cells}/uL (ref 1500–7800)
Neutrophils Relative %: 60 %
Platelets: 241 10*3/uL (ref 140–400)
RBC: 4.78 MIL/uL (ref 4.20–5.80)
RDW: 14 % (ref 11.0–15.0)
WBC: 6.1 10*3/uL (ref 3.8–10.8)

## 2017-02-26 LAB — LIPID PANEL
Cholesterol: 202 mg/dL — ABNORMAL HIGH (ref <200)
HDL: 48 mg/dL (ref >40)
LDL CALC: 131 mg/dL — AB (ref <100)
Total CHOL/HDL Ratio: 4.2 Ratio (ref <5.0)
Triglycerides: 117 mg/dL (ref <150)
VLDL: 23 mg/dL (ref <30)

## 2017-02-26 LAB — COMPLETE METABOLIC PANEL WITH GFR
ALBUMIN: 4 g/dL (ref 3.6–5.1)
ALK PHOS: 63 U/L (ref 40–115)
ALT: 12 U/L (ref 9–46)
AST: 16 U/L (ref 10–35)
BILIRUBIN TOTAL: 0.6 mg/dL (ref 0.2–1.2)
BUN: 24 mg/dL (ref 7–25)
CALCIUM: 8.8 mg/dL (ref 8.6–10.3)
CO2: 25 mmol/L (ref 20–31)
Chloride: 106 mmol/L (ref 98–110)
Creat: 1.13 mg/dL (ref 0.70–1.25)
GFR, Est African American: 78 mL/min (ref >=60)
GFR, Est Non African American: 67 mL/min (ref >=60)
GLUCOSE: 91 mg/dL (ref 70–99)
Potassium: 4 mmol/L (ref 3.5–5.3)
SODIUM: 140 mmol/L (ref 135–146)
TOTAL PROTEIN: 6.3 g/dL (ref 6.1–8.1)

## 2017-02-26 LAB — TSH: TSH: 4.99 mIU/L — ABNORMAL HIGH (ref 0.40–4.50)

## 2017-02-27 LAB — PSA

## 2017-02-28 ENCOUNTER — Encounter: Payer: Self-pay | Admitting: Family Medicine

## 2017-02-28 ENCOUNTER — Ambulatory Visit (INDEPENDENT_AMBULATORY_CARE_PROVIDER_SITE_OTHER): Payer: BC Managed Care – PPO | Admitting: Family Medicine

## 2017-02-28 VITALS — BP 136/80 | HR 60 | Temp 97.9°F | Resp 16 | Ht 75.0 in | Wt 205.0 lb

## 2017-02-28 DIAGNOSIS — E7800 Pure hypercholesterolemia, unspecified: Secondary | ICD-10-CM | POA: Diagnosis not present

## 2017-02-28 DIAGNOSIS — Z0000 Encounter for general adult medical examination without abnormal findings: Secondary | ICD-10-CM

## 2017-02-28 MED ORDER — OMEPRAZOLE 20 MG PO CPDR: 20.0000 mg | DELAYED_RELEASE_CAPSULE | Freq: Every day | ORAL | 5 refills | Status: DC

## 2017-02-28 MED ORDER — DICLOFENAC SODIUM 75 MG PO TBEC: 75.0000 mg | DELAYED_RELEASE_TABLET | Freq: Two times a day (BID) | ORAL | 4 refills | Status: DC

## 2017-02-28 MED ORDER — LORAZEPAM 1 MG PO TABS: 1.0000 mg | ORAL_TABLET | Freq: Two times a day (BID) | ORAL | 1 refills | Status: DC | PRN

## 2017-02-28 NOTE — Progress Notes (Signed)
Subjective:    Patient ID: Dwayne Young, male    DOB: 1951-05-07, 66 y.o.   MRN: [id number]  HPI  Here today for his annual exam.  He would like a refill on his lorazepam which she uses sparingly only when he flies. He is planning on flying.   Immunization History  Administered Date(s) Administered  . Hepatitis A 03/28/1999, 04/29/2011, 10/24/2011  . IPV 04/03/1999  . Influenza-Unspecified 08/13/2015  . Pneumococcal Conjugate-13 04/04/2016  . Pneumococcal Polysaccharide-23 04/20/2015  . Tdap 04/29/2011  . Typhoid Inactivated 04/03/1999, 05/01/2011  . Yellow Fever 04/03/1999  . Zoster 03/15/2013   Patient's immunizations are up-to-date. Colonoscopy is up-to-date and he has a history of prostate cancer. We are currently monitoring his PSA. It is less than 0.1. We will plan on rechecking this in 6 months. His major concern is his osteoarthritis. He would like to take diclofenac on a daily basis. There has been a slight decline in his renal function although his commode filtration rate is still well above 60. We discussed the risk of renal toxicity and the importance of taking a break periodically from NSAIDs to protect the kidneys and he is willing to accept that risk. We also discussed the risk of GI toxicity. I did ask the patient to begin omeprazole 20 mg a day if he is to take the diclofenac on a regular basis. He also reports some numbness in his left foot at the second third and fourth toe distal to the MTP joint. This is chronic and only numbness. He does have a collapsed transverse arch on exam with some callus formation on the plantar surface of the MTP joints. 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 I see no evidence of MS or other neuromuscular diseases. He is due for hepatitis C screening but he politely declines this.  Lab on 02/26/2017  Component Date Value Ref Range Status  . Sodium 02/26/2017 140  135 - 146 mmol/L  Final  . Potassium 02/26/2017 4.0  3.5 - 5.3 mmol/L Final  . Chloride 02/26/2017 106  98 - 110 mmol/L Final  . CO2 02/26/2017 25  20 - 31 mmol/L Final  . Glucose, Bld 02/26/2017 91  70 - 99 mg/dL Final  . BUN 02/26/2017 24  7 - 25 mg/dL Final  . Creat 02/26/2017 1.13  0.70 - 1.25 mg/dL Final   Comment:   For patients > or = 67 years of age: The upper reference limit for Creatinine is approximately 13% higher for people identified as African-American.     . Total Bilirubin 02/26/2017 0.6  0.2 - 1.2 mg/dL Final  . Alkaline Phosphatase 02/26/2017 63  40 - 115 U/L Final  . AST 02/26/2017 16  10 - 35 U/L Final  . ALT 02/26/2017 12  9 - 46 U/L Final  . Total Protein 02/26/2017 6.3  6.1 - 8.1 g/dL Final  . Albumin 02/26/2017 4.0  3.6 - 5.1 g/dL Final  . Calcium 02/26/2017 8.8  8.6 - 10.3 mg/dL Final  . GFR, Est African American 02/26/2017 78  >=60 mL/min Final  . GFR, Est Non African American 02/26/2017 67  >=60 mL/min Final  . TSH 02/26/2017 4.99* 0.40 - 4.50 mIU/L Final  . Cholesterol 02/26/2017 202* <200 mg/dL Final  . Triglycerides 02/26/2017 117  <150 mg/dL Final  . HDL 02/26/2017 48  >40 mg/dL Final  . Total CHOL/HDL Ratio 02/26/2017 4.2  <5.0 Ratio Final  .  VLDL 02/26/2017 23  <30 mg/dL Final  . LDL Cholesterol 02/26/2017 131* <100 mg/dL Final  . WBC 02/26/2017 6.1  3.8 - 10.8 K/uL Final  . RBC 02/26/2017 4.78  4.20 - 5.80 MIL/uL Final  . Hemoglobin 02/26/2017 14.7  13.0 - 17.0 g/dL Final  . HCT 02/26/2017 44.6  38.5 - 50.0 % Final  . MCV 02/26/2017 93.3  80.0 - 100.0 fL Final  . MCH 02/26/2017 30.8  27.0 - 33.0 pg Final  . MCHC 02/26/2017 33.0  32.0 - 36.0 g/dL Final  . RDW 02/26/2017 14.0  11.0 - 15.0 % Final  . Platelets 02/26/2017 241  140 - 400 K/uL Final  . MPV 02/26/2017 9.4  7.5 - 12.5 fL Final  . Neutro Abs 02/26/2017 3660  1,500 - 7,800 cells/uL Final  . Lymphs Abs 02/26/2017 1891  850 - 3,900 cells/uL Final  . Monocytes Absolute 02/26/2017 427  200 - 950 cells/uL  Final  . Eosinophils Absolute 02/26/2017 122  15 - 500 cells/uL Final  . Basophils Absolute 02/26/2017 0  0 - 200 cells/uL Final  . Neutrophils Relative % 02/26/2017 60  % Final  . Lymphocytes Relative 02/26/2017 31  % Final  . Monocytes Relative 02/26/2017 7  % Final  . Eosinophils Relative 02/26/2017 2  % Final  . Basophils Relative 02/26/2017 0  % Final  . Smear Review 02/26/2017 Criteria for review not met   Final  . PSA 02/26/2017 <0.1  <=4.0 ng/mL Final   Comment:   The total PSA value from this assay system is standardized against the WHO standard. The test result will be approximately 20% lower when compared to the equimolar-standardized total PSA ([employer] Coul[employer]). Comparison of serial PSA results should be interpreted with this fact in mind.   This test was performed using the Siemens chemiluminescent method. Values obtained from different assay methods cannot be used interchangeably. PSA levels, regardless of value, should not be interpreted as absolute evidence of the presence or absence of disease.   The current order code 23780 has a lower limit of quantification of 0.1 ng/mL. For post-prostatectomy patients, please use order code [phone] (PSA, Post-Prostatectomy), which can be added on within 5 days of date of collection. The lower limit of accurate quantification for order code 14808 is 0.02 ng/mL. PSA values less than 0.02 ng/mL cannot be accurately measured and will be reported as <0.02 ng/mL. Specimens with PSA leve                          ls below the lower limit of accurate quantification should be considered as negative.    Past Medical History:  Diagnosis Date  . Arthritis    arthritis-generalized-knees, elbows  . Elevated PSA   . Hypercholesteremia   . Hyperlipidemia   . Prostate cancer ([place])    dx. prostate cancer after bx. 2'15-now surgery planned  . Retinal detachment   . Seasonal allergies    Past Surgical History:  Procedure Laterality Date    . COLONOSCOPY N/A 04/27/2013   Procedure: COLONOSCOPY;  Surgeon: Jamesetta So, MD;  Location: [facility];  Service: Gastroenterology;  Laterality: N/A;  . GANGLION CYST EXCISION Right   . KNEE ARTHROSCOPY Bilateral    open procedure  . LYMPHADENECTOMY Bilateral 03/03/2014   Procedure: LYMPHADENECTOMY;  Surgeon: Raynelle Bring, MD;  Location: [facility];  Service: Urology;  Laterality: Bilateral;  . NASAL SEPTOPLASTY W/ TURBINOPLASTY    . ROBOT ASSISTED  LAPAROSCOPIC RADICAL PROSTATECTOMY N/A 03/03/2014   Procedure: ROBOTIC ASSISTED LAPAROSCOPIC RADICAL PROSTATECTOMY LEVEL 2;  Surgeon: Raynelle Bring, MD;  Location: [facility];  Service: Urology;  Laterality: N/A;  . TONSILLECTOMY     child  . TOOTH EXTRACTION     preparing for tooth implant   Current Outpatient Prescriptions on File Prior to Visit  Medication Sig Dispense Refill  . aspirin 81 MG tablet Take 81 mg by mouth daily.    . cromolyn (NASALCROM) 5.2 MG/ACT nasal spray Place 1 spray into both nostrils 4 (four) times daily.    . Misc Natural Products (BLACK CHERRY CONCENTRATE PO) Take by mouth.    Marland Kitchen OVER THE COUNTER MEDICATION      No current facility-administered medications on file prior to visit.    Allergies  Allergen Reactions  . Demerol [Meperidine] Nausea And Vomiting   Social History   Social History  . Marital status: Married    Spouse name: N/A  . Number of children: N/A  . Years of education: N/A   Occupational History  . Not on file.   Social History Main Topics  . Smoking status: Former Smoker    Packs/day: 0.50    Years: 15.00    Types: Cigarettes    Quit date: 04/27/1989  . Smokeless tobacco: Never Used  . Alcohol use 0.6 oz/week    1 Glasses of wine per week     Comment: moderate - wine every 3 rd day  . Drug use: No  . Sexual activity: Yes    Birth control/ protection: None   Other Topics Concern  . Not on file   Social History Narrative  . No narrative on file   No family history on  file.    Review of Systems  All other systems reviewed and are negative.      Objective:   Physical Exam  Constitutional: He is oriented to person, place, and time. He appears well-developed and well-nourished. No distress.  HENT:  Head: Normocephalic and atraumatic.  Right Ear: External ear normal.  Left Ear: External ear normal.  Nose: Nose normal.  Mouth/Throat: Oropharynx is clear and moist. No oropharyngeal exudate.  Eyes: Conjunctivae and EOM are normal. Pupils are equal, round, and reactive to light. Right eye exhibits no discharge. Left eye exhibits no discharge. No scleral icterus.  Neck: Normal range of motion. Neck supple. No JVD present. No tracheal deviation present. No thyromegaly present.  Cardiovascular: Normal rate, regular rhythm, normal heart sounds and intact distal pulses.  Exam reveals no gallop and no friction rub.   No murmur heard. Pulmonary/Chest: Effort normal and breath sounds normal. No stridor. No respiratory distress. He has no wheezes. He has no rales. He exhibits no tenderness.  Abdominal: Soft. Bowel sounds are normal. He exhibits no distension and no mass. There is no tenderness. There is no rebound and no guarding.  Musculoskeletal: Normal range of motion. He exhibits no edema or tenderness.  Lymphadenopathy:    He has no cervical adenopathy.  Neurological: He is alert and oriented to person, place, and time. He has normal reflexes. No cranial nerve deficit. He exhibits normal muscle tone. Coordination normal.  Skin: Skin is warm. No rash noted. He is not diaphoretic. No erythema. No pallor.  Psychiatric: He has a normal mood and affect. His behavior is normal. Judgment and thought content normal.  Vitals reviewed.         Assessment & Plan:  Routine general medical examination at a health  care facility  Pure hypercholesterolemia We discussed the risk of cardiovascular disease. His 10 year risk of cardiovascular diseases 16% with his  current cholesterol. Recommendations would be to start a statin. He politely declines this. He declines hepatitis C screening. Colonoscopy is up-to-date. PSA should be rechecked in 6 months. Immunizations are up-to-date. She will begin omeprazole 20 mg every day if he is taking diclofenac every day. I would also like to monitor his renal test every 6 months if he is taking diclofenac regularly.

## 2017-04-30 IMAGING — CR DG CHEST 2V
2 series · 2 of 2 positions shown · non-contrast
Comparison: Chest radiograph 02/14/2014.

CLINICAL DATA: Patient with shortness of breath for 3 months.
Cough.

EXAM:
CHEST  2 VIEW

[w chest pa]
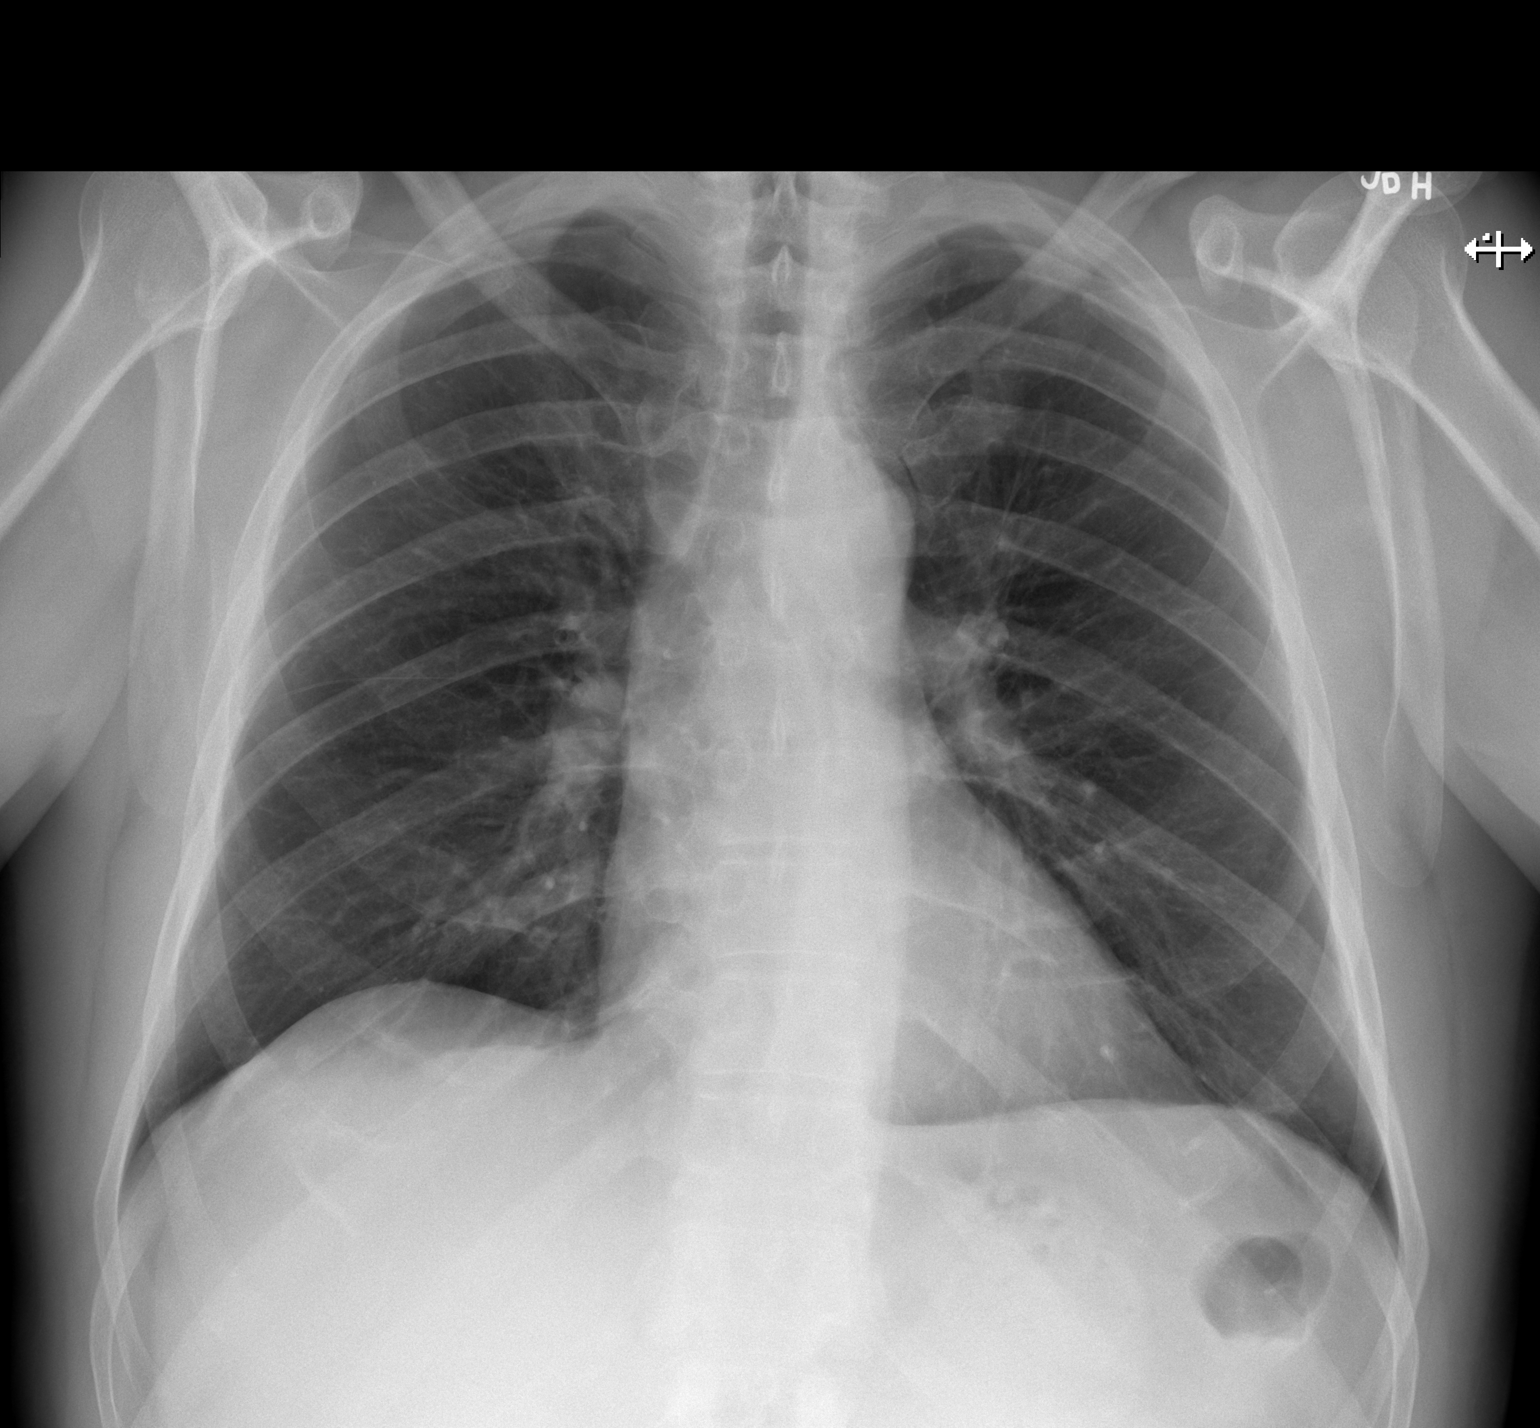

[w chest lat]
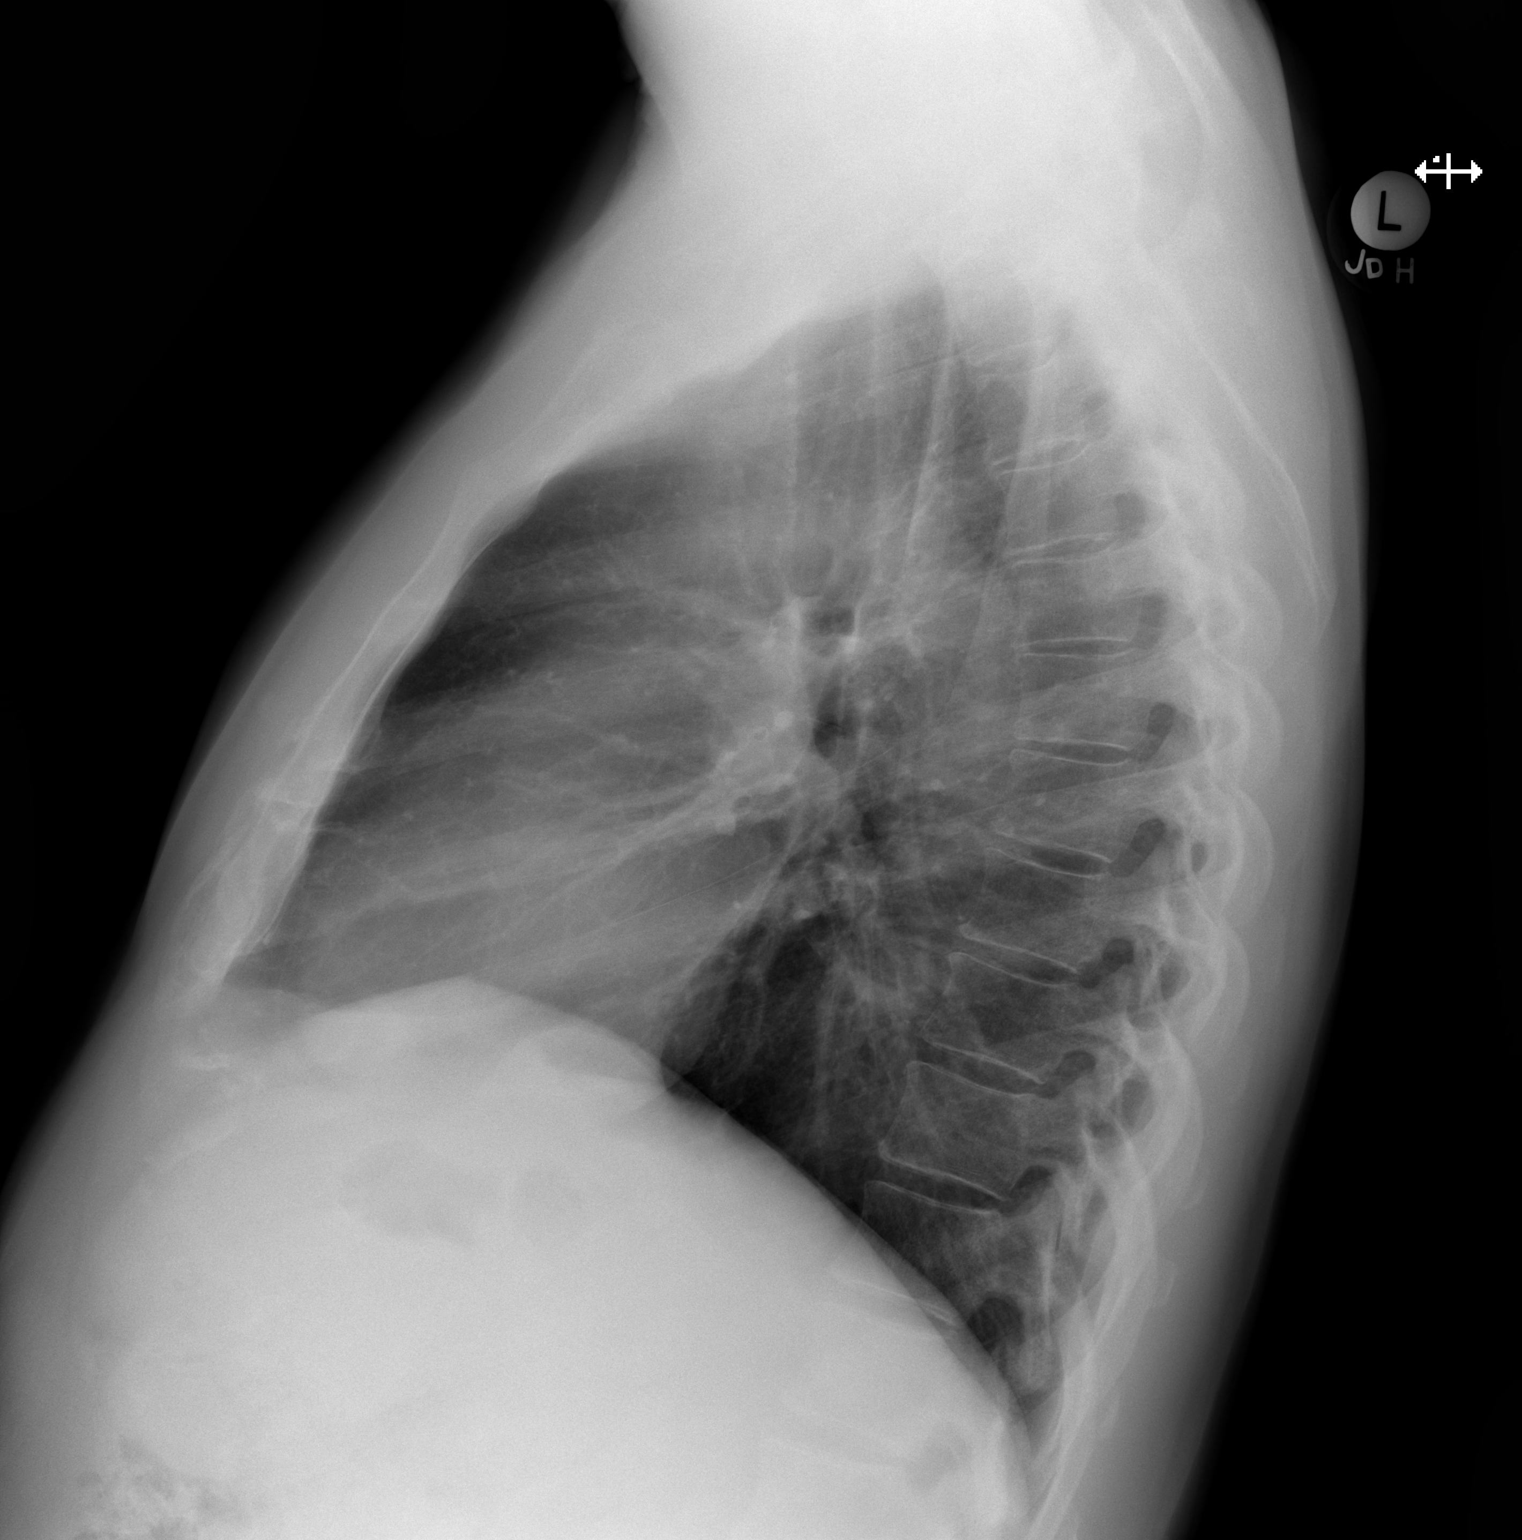

[2 of 2 positions shown; findings below may reference images not displayed]

FINDINGS: Normal cardiac and mediastinal contours. No consolidative pulmonary
opacities. No pleural effusion or pneumothorax. Regional skeleton is
unremarkable.
IMPRESSION: No active cardiopulmonary disease.

## 2017-05-20 ENCOUNTER — Ambulatory Visit: Payer: BC Managed Care – PPO | Admitting: Family Medicine

## 2017-05-30 ENCOUNTER — Ambulatory Visit (INDEPENDENT_AMBULATORY_CARE_PROVIDER_SITE_OTHER): Payer: BC Managed Care – PPO | Admitting: Family Medicine

## 2017-05-30 ENCOUNTER — Encounter: Payer: Self-pay | Admitting: Family Medicine

## 2017-05-30 VITALS — BP 136/90 | HR 64 | Temp 98.6°F | Resp 16 | Ht 75.0 in | Wt 206.0 lb

## 2017-05-30 DIAGNOSIS — B079 Viral wart, unspecified: Secondary | ICD-10-CM | POA: Diagnosis not present

## 2017-05-30 NOTE — Progress Notes (Signed)
Subjective:    Patient ID: Dwayne Young, male    DOB: [birth year], 66 y.o.   MRN: [id number]  HPI  He has a warty papule overlying his left elbow.  Has been there for several months. It is a proximally 6 mm in diameter. It is a firm verruciform nodular papule.  There are no concerning features. It does not bleed. He would like it removed Past Medical History:  Diagnosis Date  . Arthritis    arthritis-generalized-knees, elbows  . Elevated PSA   . Hypercholesteremia   . Hyperlipidemia   . Prostate cancer ([place])    dx. prostate cancer after bx. 2'15-now surgery planned  . Retinal detachment   . Seasonal allergies    Past Surgical History:  Procedure Laterality Date  . COLONOSCOPY N/A 04/27/2013   Procedure: COLONOSCOPY;  Surgeon: Jamesetta So, MD;  Location: [facility];  Service: Gastroenterology;  Laterality: N/A;  . GANGLION CYST EXCISION Right   . KNEE ARTHROSCOPY Bilateral    open procedure  . LYMPHADENECTOMY Bilateral 03/03/2014   Procedure: LYMPHADENECTOMY;  Surgeon: Raynelle Bring, MD;  Location: [facility];  Service: Urology;  Laterality: Bilateral;  . NASAL SEPTOPLASTY W/ TURBINOPLASTY    . ROBOT ASSISTED LAPAROSCOPIC RADICAL PROSTATECTOMY N/A 03/03/2014   Procedure: ROBOTIC ASSISTED LAPAROSCOPIC RADICAL PROSTATECTOMY LEVEL 2;  Surgeon: Raynelle Bring, MD;  Location: [facility];  Service: Urology;  Laterality: N/A;  . TONSILLECTOMY     child  . TOOTH EXTRACTION     preparing for tooth implant   Current Outpatient Prescriptions on File Prior to Visit  Medication Sig Dispense Refill  . aspirin 81 MG tablet Take 81 mg by mouth daily.    . cromolyn (NASALCROM) 5.2 MG/ACT nasal spray Place 1 spray into both nostrils 4 (four) times daily.    . diclofenac (VOLTAREN) 75 MG EC tablet Take 1 tablet (75 mg total) by mouth 2 (two) times daily. 60 tablet 4  . Ibuprofen-Famotidine (DUEXIS) 800-26.6 MG TABS     . LORazepam (ATIVAN) 1 MG tablet Take 1 tablet (1 mg total) by mouth 2 (two)  times daily as needed for anxiety. 20 tablet 1  . Misc Natural Products (BLACK CHERRY CONCENTRATE PO) Take by mouth.    . mometasone (NASONEX) 50 MCG/ACT nasal spray Place 2 sprays into the nose daily.    Marland Kitchen omeprazole (PRILOSEC) 20 MG capsule Take 1 capsule (20 mg total) by mouth daily. 30 capsule 5  . OVER THE COUNTER MEDICATION      No current facility-administered medications on file prior to visit.    Allergies  Allergen Reactions  . Demerol [Meperidine] Nausea And Vomiting   Social History   Social History  . Marital status: Married    Spouse name: N/A  . Number of children: N/A  . Years of education: N/A   Occupational History  . Not on file.   Social History Main Topics  . Smoking status: Former Smoker    Packs/day: 0.50    Years: 15.00    Types: Cigarettes    Quit date: 04/27/1989  . Smokeless tobacco: Never Used  . Alcohol use 0.6 oz/week    1 Glasses of wine per week     Comment: moderate - wine every 3 rd day  . Drug use: No  . Sexual activity: Yes    Birth control/ protection: None   Other Topics Concern  . Not on file   Social History Narrative  . No narrative on file  Review of Systems  All other systems reviewed and are negative.      Objective:   Physical Exam  Cardiovascular: Normal rate and regular rhythm.   Pulmonary/Chest: Effort normal and breath sounds normal.  Vitals reviewed.   6 mm wartlike papule on the left elbow      Assessment & Plan:  Viral warts, unspecified type  Area was anesthetized 0.1% lidocaine with epinephrine. Shave biopsy was performed down to the underlying dermis and the lesion was removed in its entirety. Hemostasis was achieved with Drysol and a Band-Aid. Patient tolerated the procedure well with no blood loss.

## 2017-07-15 ENCOUNTER — Other Ambulatory Visit: Payer: Self-pay | Admitting: Family Medicine

## 2017-07-16 NOTE — Telephone Encounter (Signed)
Rx filled per protocol  

## 2017-08-26 ENCOUNTER — Ambulatory Visit: Payer: BC Managed Care – PPO | Admitting: Urology

## 2017-08-26 DIAGNOSIS — Z8546 Personal history of malignant neoplasm of prostate: Secondary | ICD-10-CM | POA: Diagnosis not present

## 2017-08-26 DIAGNOSIS — N5231 Erectile dysfunction following radical prostatectomy: Secondary | ICD-10-CM | POA: Diagnosis not present

## 2017-08-27 ENCOUNTER — Other Ambulatory Visit: Payer: Self-pay | Admitting: Family Medicine

## 2017-12-25 ENCOUNTER — Other Ambulatory Visit: Payer: Medicare Other

## 2017-12-25 DIAGNOSIS — Z79899 Other long term (current) drug therapy: Secondary | ICD-10-CM

## 2017-12-25 DIAGNOSIS — E785 Hyperlipidemia, unspecified: Secondary | ICD-10-CM

## 2017-12-25 DIAGNOSIS — R972 Elevated prostate specific antigen [PSA]: Secondary | ICD-10-CM

## 2017-12-26 LAB — CBC WITH DIFFERENTIAL/PLATELET
BASOS ABS: 30 {cells}/uL (ref 0–200)
BASOS PCT: 0.5 %
EOS PCT: 1.8 %
Eosinophils Absolute: 108 cells/uL (ref 15–500)
HEMATOCRIT: 43.4 % (ref 38.5–50.0)
Hemoglobin: 14.6 g/dL (ref 13.2–17.1)
LYMPHS ABS: 1962 {cells}/uL (ref 850–3900)
MCH: 30.8 pg (ref 27.0–33.0)
MCHC: 33.6 g/dL (ref 32.0–36.0)
MCV: 91.6 fL (ref 80.0–100.0)
MPV: 9.8 fL (ref 7.5–12.5)
Monocytes Relative: 7.3 %
NEUTROS ABS: 3462 {cells}/uL (ref 1500–7800)
Neutrophils Relative %: 57.7 %
Platelets: 269 10*3/uL (ref 140–400)
RBC: 4.74 10*6/uL (ref 4.20–5.80)
RDW: 12.8 % (ref 11.0–15.0)
Total Lymphocyte: 32.7 %
WBC mixed population: 438 cells/uL (ref 200–950)
WBC: 6 10*3/uL (ref 3.8–10.8)

## 2017-12-26 LAB — LIPID PANEL
Cholesterol: 222 mg/dL — ABNORMAL HIGH (ref <200)
HDL: 49 mg/dL (ref >40)
LDL CHOLESTEROL (CALC): 148 mg/dL — AB
Non-HDL Cholesterol (Calc): 173 mg/dL (calc) — ABNORMAL HIGH (ref <130)
TRIGLYCERIDES: 127 mg/dL (ref <150)
Total CHOL/HDL Ratio: 4.5 (calc) (ref <5.0)

## 2017-12-26 LAB — COMPREHENSIVE METABOLIC PANEL
AG RATIO: 2 (calc) (ref 1.0–2.5)
ALKALINE PHOSPHATASE (APISO): 65 U/L (ref 40–115)
ALT: 11 U/L (ref 9–46)
AST: 16 U/L (ref 10–35)
Albumin: 4.1 g/dL (ref 3.6–5.1)
BILIRUBIN TOTAL: 0.4 mg/dL (ref 0.2–1.2)
BUN/Creatinine Ratio: 18 (calc) (ref 6–22)
BUN: 24 mg/dL (ref 7–25)
CALCIUM: 9.1 mg/dL (ref 8.6–10.3)
CO2: 27 mmol/L (ref 20–32)
Chloride: 107 mmol/L (ref 98–110)
Creat: 1.37 mg/dL — ABNORMAL HIGH (ref 0.70–1.25)
Globulin: 2.1 g/dL (calc) (ref 1.9–3.7)
Glucose, Bld: 95 mg/dL (ref 65–99)
Potassium: 4.9 mmol/L (ref 3.5–5.3)
SODIUM: 140 mmol/L (ref 135–146)
TOTAL PROTEIN: 6.2 g/dL (ref 6.1–8.1)

## 2017-12-26 LAB — PSA

## 2017-12-29 ENCOUNTER — Ambulatory Visit: Payer: BC Managed Care – PPO | Admitting: Family Medicine

## 2017-12-29 ENCOUNTER — Encounter: Payer: Self-pay | Admitting: Family Medicine

## 2017-12-29 VITALS — BP 120/62 | HR 64 | Temp 98.0°F | Ht 75.0 in | Wt 201.0 lb

## 2017-12-29 DIAGNOSIS — N289 Disorder of kidney and ureter, unspecified: Secondary | ICD-10-CM

## 2017-12-29 DIAGNOSIS — E7800 Pure hypercholesterolemia, unspecified: Secondary | ICD-10-CM | POA: Diagnosis not present

## 2017-12-29 DIAGNOSIS — G5793 Unspecified mononeuropathy of bilateral lower limbs: Secondary | ICD-10-CM

## 2017-12-29 DIAGNOSIS — M79672 Pain in left foot: Secondary | ICD-10-CM

## 2017-12-29 DIAGNOSIS — M1712 Unilateral primary osteoarthritis, left knee: Secondary | ICD-10-CM

## 2017-12-29 MED ORDER — LORAZEPAM 1 MG PO TABS: 1.0000 mg | ORAL_TABLET | Freq: Two times a day (BID) | ORAL | 1 refills | Status: DC | PRN

## 2017-12-29 MED ORDER — DICLOFENAC SODIUM 1 % TD GEL: 2.0000 g | Freq: Four times a day (QID) | TRANSDERMAL | 4 refills | Status: DC

## 2017-12-29 NOTE — Progress Notes (Signed)
Subjective:    Patient ID: Dwayne Young, male    DOB: [birth year], 67 y.o.   MRN: [id number]  HPI  Patient's most recent lab work as listed below.  There has been a decline in his renal function.  He has been taking diclofenac regularly for osteoarthritic pain in his neck and his hands and his feet.  This may account for this decline in his renal function.  He also complains of numbness and tingling in all 5 toes on both feet distal to the MTP joints.  This is a chronic finding.  He is also complaining of pain over the lateral aspect of the fifth MTP joint on the left foot where he has a bunion developing.  He would like to see podiatry to discuss options to treat this.  His lab work listed below is also significant for elevated cholesterol.  His 10-year risk of cardiovascular disease is greater than 14% suggesting that he should be on a statin.  I discussed these risk with him today Lab on 12/25/2017  Component Date Value Ref Range Status  . WBC 12/25/2017 6.0  3.8 - 10.8 Thousand/uL Final  . RBC 12/25/2017 4.74  4.20 - 5.80 Million/uL Final  . Hemoglobin 12/25/2017 14.6  13.2 - 17.1 g/dL Final  . HCT 12/25/2017 43.4  38.5 - 50.0 % Final  . MCV 12/25/2017 91.6  80.0 - 100.0 fL Final  . MCH 12/25/2017 30.8  27.0 - 33.0 pg Final  . MCHC 12/25/2017 33.6  32.0 - 36.0 g/dL Final  . RDW 12/25/2017 12.8  11.0 - 15.0 % Final  . Platelets 12/25/2017 269  140 - 400 Thousand/uL Final  . MPV 12/25/2017 9.8  7.5 - 12.5 fL Final  . Neutro Abs 12/25/2017 3,462  1,500 - 7,800 cells/uL Final  . Lymphs Abs 12/25/2017 1,962  850 - 3,900 cells/uL Final  . WBC mixed population 12/25/2017 438  200 - 950 cells/uL Final  . Eosinophils Absolute 12/25/2017 108  15 - 500 cells/uL Final  . Basophils Absolute 12/25/2017 30  0 - 200 cells/uL Final  . Neutrophils Relative % 12/25/2017 57.7  % Final  . Total Lymphocyte 12/25/2017 32.7  % Final  . Monocytes Relative 12/25/2017 7.3  % Final  . Eosinophils Relative  12/25/2017 1.8  % Final  . Basophils Relative 12/25/2017 0.5  % Final  . Glucose, Bld 12/25/2017 95  65 - 99 mg/dL Final   Comment: .            Fasting reference interval .   . BUN 12/25/2017 24  7 - 25 mg/dL Final  . Creat 12/25/2017 1.37* 0.70 - 1.25 mg/dL Final   Comment: For patients >37 years of age, the reference limit for Creatinine is approximately 13% higher for people identified as African-American. .   Havery Moros Ratio 12/25/2017 18  6 - 22 (calc) Final  . Sodium 12/25/2017 140  135 - 146 mmol/L Final  . Potassium 12/25/2017 4.9  3.5 - 5.3 mmol/L Final  . Chloride 12/25/2017 107  98 - 110 mmol/L Final  . CO2 12/25/2017 27  20 - 32 mmol/L Final  . Calcium 12/25/2017 9.1  8.6 - 10.3 mg/dL Final  . Total Protein 12/25/2017 6.2  6.1 - 8.1 g/dL Final  . Albumin 12/25/2017 4.1  3.6 - 5.1 g/dL Final  . Globulin 12/25/2017 2.1  1.9 - 3.7 g/dL (calc) Final  . AG Ratio 12/25/2017 2.0  1.0 - 2.5 (calc) Final  . Total  Bilirubin 12/25/2017 0.4  0.2 - 1.2 mg/dL Final  . Alkaline phosphatase (APISO) 12/25/2017 65  40 - 115 U/L Final  . AST 12/25/2017 16  10 - 35 U/L Final  . ALT 12/25/2017 11  9 - 46 U/L Final  . Cholesterol 12/25/2017 222* <200 mg/dL Final  . HDL 12/25/2017 49  >40 mg/dL Final  . Triglycerides 12/25/2017 127  <150 mg/dL Final  . LDL Cholesterol (Calc) 12/25/2017 148* mg/dL (calc) Final   Comment: Reference range: <100 . Desirable range <100 mg/dL for primary prevention;   <70 mg/dL for patients with CHD or diabetic patients  with > or = 2 CHD risk factors. Marland Kitchen LDL-C is now calculated using the Martin-Hopkins  calculation, which is a validated novel method providing  better accuracy than the Friedewald equation in the  estimation of LDL-C.  Cresenciano Genre et al. Annamaria Helling. 8657;846(96): 2061-2068  (http://education.[facility].com/faq/FAQ164)   . Total CHOL/HDL Ratio 12/25/2017 4.5  <5.0 (calc) Final  . Non-HDL Cholesterol (Calc) 12/25/2017 173* <130 mg/dL  (calc) Final   Comment: For patients with diabetes plus 1 major ASCVD risk  factor, treating to a non-HDL-C goal of <100 mg/dL  (LDL-C of <70 mg/dL) is considered a therapeutic  option.   Marland Kitchen PSA 12/25/2017 <0.1  < OR = 4.0 ng/mL Final   Comment: The total PSA value from this assay system is  standardized against the WHO standard. The test  result will be approximately 20% lower when compared  to the equimolar-standardized total PSA (Beckman  Coulter). Comparison of serial PSA results should be  interpreted with this fact in mind. . This test was performed using the Siemens  chemiluminescent method. Values obtained from  different assay methods cannot be used interchangeably. PSA levels, regardless of value, should not be interpreted as absolute evidence of the presence or absence of disease.    Past Medical History:  Diagnosis Date  . Arthritis    arthritis-generalized-knees, elbows  . Elevated PSA   . Hypercholesteremia   . Hyperlipidemia   . Prostate cancer ([place])    dx. prostate cancer after bx. 2'15-now surgery planned  . Retinal detachment   . Seasonal allergies    Past Surgical History:  Procedure Laterality Date  . COLONOSCOPY N/A 04/27/2013   Procedure: COLONOSCOPY;  Surgeon: Jamesetta So, MD;  [facility];  Service: Gastroenterology;  Laterality: N/A;  . GANGLION CYST EXCISION Right   . KNEE ARTHROSCOPY Bilateral    open procedure  . LYMPHADENECTOMY Bilateral 03/03/2014   Procedure: LYMPHADENECTOMY;  Surgeon: Raynelle Bring, MD;  Location: [facility];  Service: Urology;  Laterality: Bilateral;  . NASAL SEPTOPLASTY W/ TURBINOPLASTY    . ROBOT ASSISTED LAPAROSCOPIC RADICAL PROSTATECTOMY N/A 03/03/2014   Procedure: ROBOTIC ASSISTED LAPAROSCOPIC RADICAL PROSTATECTOMY LEVEL 2;  Surgeon: Raynelle Bring, MD;  Location: [facility];  Service: Urology;  Laterality: N/A;  . TONSILLECTOMY     child  . TOOTH EXTRACTION     preparing for tooth implant   Current Outpatient  Medications on File Prior to Visit  Medication Sig Dispense Refill  . aspirin 81 MG tablet Take 81 mg by mouth daily.    . cromolyn (NASALCROM) 5.2 MG/ACT nasal spray Place 1 spray into both nostrils 4 (four) times daily.    . diclofenac (VOLTAREN) 75 MG EC tablet TAKE (1) TABLET BY MOUTH TWICE DAILY. 60 tablet 0  . Misc Natural Products (BLACK CHERRY CONCENTRATE PO) Take by mouth.    . mometasone (NASONEX) 50 MCG/ACT nasal  spray Place 2 sprays into the nose daily.    Marland Kitchen omeprazole (PRILOSEC) 20 MG capsule TAKE ONE CAPSULE BY MOUTH ONCE DAILY. 90 capsule 3  . OVER THE COUNTER MEDICATION      No current facility-administered medications on file prior to visit.    Allergies  Allergen Reactions  . Demerol [Meperidine] Nausea And Vomiting   Social History   Socioeconomic History  . Marital status: Married    Spouse name: Not on file  . Number of children: Not on file  . Years of education: Not on file  . Highest education level: Not on file  Social Needs  . Financial resource strain: Not on file  . Food insecurity - worry: Not on file  . Food insecurity - inability: Not on file  . Transportation needs - medical: Not on file  . Transportation needs - non-medical: Not on file  Occupational History  . Not on file  Tobacco Use  . Smoking status: Former Smoker    Packs/day: 0.50    Years: 15.00    Pack years: 7.50    Types: Cigarettes    Last attempt to quit: 04/27/1989    Years since quitting: 28.6  . Smokeless tobacco: Never Used  Substance and Sexual Activity  . Alcohol use: Yes    Alcohol/week: 0.6 oz    Types: 1 Glasses of wine per week    Comment: moderate - wine every 3 rd day  . Drug use: No  . Sexual activity: Yes    Birth control/protection: None  Other Topics Concern  . Not on file  Social History Narrative  . Not on file   No family history on file.    Review of Systems  All other systems reviewed and are negative.      Objective:   Physical Exam    Constitutional: He is oriented to person, place, and time. He appears well-developed and well-nourished. No distress.  HENT:  Head: Normocephalic and atraumatic.  Right Ear: External ear normal.  Left Ear: External ear normal.  Nose: Nose normal.  Mouth/Throat: Oropharynx is clear and moist. No oropharyngeal exudate.  Eyes: Conjunctivae and EOM are normal. Pupils are equal, round, and reactive to light. Right eye exhibits no discharge. Left eye exhibits no discharge. No scleral icterus.  Neck: Normal range of motion. Neck supple. No JVD present. No tracheal deviation present. No thyromegaly present.  Cardiovascular: Normal rate, regular rhythm, normal heart sounds and intact distal pulses. Exam reveals no gallop and no friction rub.  No murmur heard. Pulmonary/Chest: Effort normal and breath sounds normal. No stridor. No respiratory distress. He has no wheezes. He has no rales. He exhibits no tenderness.  Abdominal: Soft. Bowel sounds are normal. He exhibits no distension and no mass. There is no tenderness. There is no rebound and no guarding.  Musculoskeletal: Normal range of motion. He exhibits no edema or tenderness.  Lymphadenopathy:    He has no cervical adenopathy.  Neurological: He is alert and oriented to person, place, and time. He has normal reflexes. No cranial nerve deficit. He exhibits normal muscle tone. Coordination normal.  Skin: Skin is warm. No rash noted. He is not diaphoretic. No erythema. No pallor.  Psychiatric: He has a normal mood and affect. His behavior is normal. Judgment and thought content normal.  Vitals reviewed.         Assessment & Plan:  Left foot pain - Plan: Ambulatory referral to Podiatry  Pure hypercholesterolemia  Primary osteoarthritis of  left knee  Acute renal insufficiency - Plan: BASIC METABOLIC PANEL WITH GFR  Neuropathy of both feet - Plan: Vitamin B12 We discussed the risk of cardiovascular disease. His 10 year risk of  cardiovascular diseases 14% with his current cholesterol. Recommendations would be to start a statin. He politely declines this.  His blood pressure is acceptable.  His renal function has deteriorated.  He will avoid diclofenac and drink more water and recheck renal function in 2 weeks.  Given the neuropathy in his feet, I will also check a B12 level in 2 weeks to rule out B12 deficiency.  However I believe this is most likely chronic peripheral neuropathy.  Due to the bunion on his left lateral fifth MTP joint, I will consult podiatry to discuss surgical treatment.  I have recommended that the patient discontinue omeprazole since he is no longer taking diclofenac.  He can use Voltaren gel 2 g 4 times a day as needed on his joints for arthritic pain.  He can also use Tylenol

## 2017-12-31 ENCOUNTER — Telehealth: Payer: Self-pay | Admitting: Family Medicine

## 2017-12-31 MED ORDER — DICLOFENAC SODIUM 1 % TD GEL: 2.0000 g | Freq: Four times a day (QID) | TRANSDERMAL | 4 refills | Status: DC

## 2017-12-31 NOTE — Telephone Encounter (Signed)
PA Submitted through [email or web address] and received the following:  [employer] is reviewing your PA request. Typically an electronic response will be received within 72 hours. To check for an update later, open this request from your dashboard.   

## 2017-12-31 NOTE — Telephone Encounter (Signed)
Approvedtoday  Request Reference Number: [id number]. DICLOFENAC GEL 1% is approved through 10/13/2018. For further questions, call [phone].  Pharm aware

## 2018-01-07 ENCOUNTER — Other Ambulatory Visit: Payer: Medicare Other

## 2018-01-07 DIAGNOSIS — G629 Polyneuropathy, unspecified: Secondary | ICD-10-CM

## 2018-01-07 DIAGNOSIS — N289 Disorder of kidney and ureter, unspecified: Secondary | ICD-10-CM

## 2018-01-08 LAB — TEST AUTHORIZATION

## 2018-01-08 LAB — BASIC METABOLIC PANEL
BUN / CREAT RATIO: 18 (calc) (ref 6–22)
BUN: 26 mg/dL — AB (ref 7–25)
CALCIUM: 9.4 mg/dL (ref 8.6–10.3)
CO2: 27 mmol/L (ref 20–32)
Chloride: 104 mmol/L (ref 98–110)
Creat: 1.42 mg/dL — ABNORMAL HIGH (ref 0.70–1.25)
Glucose, Bld: 101 mg/dL — ABNORMAL HIGH (ref 65–99)
Potassium: 4.1 mmol/L (ref 3.5–5.3)
SODIUM: 138 mmol/L (ref 135–146)

## 2018-01-08 LAB — VITAMIN B12: VITAMIN B 12: 453 pg/mL (ref 200–1100)

## 2018-01-12 DIAGNOSIS — R944 Abnormal results of kidney function studies: Secondary | ICD-10-CM

## 2018-01-12 DIAGNOSIS — N289 Disorder of kidney and ureter, unspecified: Secondary | ICD-10-CM

## 2018-01-19 ENCOUNTER — Other Ambulatory Visit: Payer: Medicare Other

## 2018-01-19 DIAGNOSIS — R944 Abnormal results of kidney function studies: Secondary | ICD-10-CM

## 2018-01-19 LAB — URINALYSIS, ROUTINE W REFLEX MICROSCOPIC
Bilirubin Urine: NEGATIVE
Glucose, UA: NEGATIVE
Hgb urine dipstick: NEGATIVE
Ketones, ur: NEGATIVE
LEUKOCYTES UA: NEGATIVE
NITRITE: NEGATIVE
PROTEIN: NEGATIVE
SPECIFIC GRAVITY, URINE: 1.025 (ref 1.001–1.03)
pH: 5 (ref 5.0–8.0)

## 2018-01-20 ENCOUNTER — Other Ambulatory Visit: Payer: Self-pay | Admitting: Podiatry

## 2018-01-20 ENCOUNTER — Ambulatory Visit (INDEPENDENT_AMBULATORY_CARE_PROVIDER_SITE_OTHER): Payer: Medicare Other

## 2018-01-20 ENCOUNTER — Ambulatory Visit: Payer: Medicare Other | Admitting: Podiatry

## 2018-01-20 ENCOUNTER — Encounter: Payer: Self-pay | Admitting: Podiatry

## 2018-01-20 ENCOUNTER — Ambulatory Visit
Admission: RE | Admit: 2018-01-20 | Discharge: 2018-01-20 | Disposition: A | Discharge status: Home / Self Care | Payer: Medicare Other | Source: Ambulatory Visit | Attending: Family Medicine | Admitting: Family Medicine

## 2018-01-20 VITALS — BP 149/80 | HR 78 | Resp 16

## 2018-01-20 DIAGNOSIS — M21622 Bunionette of left foot: Secondary | ICD-10-CM | POA: Diagnosis not present

## 2018-01-20 DIAGNOSIS — G5782 Other specified mononeuropathies of left lower limb: Secondary | ICD-10-CM

## 2018-01-20 DIAGNOSIS — M779 Enthesopathy, unspecified: Principal | ICD-10-CM

## 2018-01-20 DIAGNOSIS — G5762 Lesion of plantar nerve, left lower limb: Secondary | ICD-10-CM | POA: Diagnosis not present

## 2018-01-20 DIAGNOSIS — Q828 Other specified congenital malformations of skin: Secondary | ICD-10-CM

## 2018-01-20 DIAGNOSIS — M7750 Other enthesopathy of unspecified foot: Secondary | ICD-10-CM

## 2018-01-20 DIAGNOSIS — R944 Abnormal results of kidney function studies: Secondary | ICD-10-CM

## 2018-01-20 DIAGNOSIS — M2012 Hallux valgus (acquired), left foot: Secondary | ICD-10-CM

## 2018-01-20 DIAGNOSIS — M778 Other enthesopathies, not elsewhere classified: Secondary | ICD-10-CM

## 2018-01-20 NOTE — Progress Notes (Signed)
Subjective:  Patient ID: Dwayne Young, male    DOB: 09-27-1951,  MRN: [id number] HPI Chief Complaint  Patient presents with  . Foot Pain    Plantar forefoot left - numbness and tenderness x 2 years, getting worse, affecting toes now  . Foot Pain    5th MPJ left - more painful recently, shoes aggravate  . Callouses    Plantar forefoot left - small callused area x months  . Foot Pain    Patient did not express concern of the right foot, however wanted me to xray - he's had problems with anterior ankle and dorsal foot intermittently  . New Patient (Initial Visit)    67 y.o. male presents with the above complaint.   ROS: Denies fever chills nausea vomiting muscle aches pains chest pain shortness of breath.  Past Medical History:  Diagnosis Date  . Arthritis    arthritis-generalized-knees, elbows  . Elevated PSA   . Hypercholesteremia   . Hyperlipidemia   . Prostate cancer ([place])    dx. prostate cancer after bx. 2'15-now surgery planned  . Retinal detachment   . Seasonal allergies    Past Surgical History:  Procedure Laterality Date  . COLONOSCOPY N/A 04/27/2013   Procedure: COLONOSCOPY;  Surgeon: Jamesetta So, MD;  [facility];  Service: Gastroenterology;  Laterality: N/A;  . GANGLION CYST EXCISION Right   . KNEE ARTHROSCOPY Bilateral    open procedure  . LYMPHADENECTOMY Bilateral 03/03/2014   Procedure: LYMPHADENECTOMY;  Surgeon: Raynelle Bring, MD;  [facility];  Service: Urology;  Laterality: Bilateral;  . NASAL SEPTOPLASTY W/ TURBINOPLASTY    . ROBOT ASSISTED LAPAROSCOPIC RADICAL PROSTATECTOMY N/A 03/03/2014   Procedure: ROBOTIC ASSISTED LAPAROSCOPIC RADICAL PROSTATECTOMY LEVEL 2;  Surgeon: Raynelle Bring, MD;  Location[facility];  Service: Urology;  Laterality: N/A;  . TONSILLECTOMY     child  . TOOTH EXTRACTION     preparing for tooth implant    Current Outpatient Medications:  .  aspirin 81 MG tablet, Take 81 mg by mouth daily., Disp: , Rfl:  .   cromolyn (NASALCROM) 5.2 MG/ACT nasal spray, Place 1 spray into both nostrils 4 (four) times daily., Disp: , Rfl:  .  diclofenac (VOLTAREN) 75 MG EC tablet, TAKE (1) TABLET BY MOUTH TWICE DAILY., Disp: 60 tablet, Rfl: 0 .  diclofenac sodium (VOLTAREN) 1 % GEL, Apply 2 g topically 4 (four) times daily., Disp: 100 g, Rfl: 4 .  LORazepam (ATIVAN) 1 MG tablet, Take 1 tablet (1 mg total) by mouth 2 (two) times daily as needed for anxiety., Disp: 20 tablet, Rfl: 1 .  Misc Natural Products (BLACK CHERRY CONCENTRATE PO), Take by mouth., Disp: , Rfl:  .  mometasone (NASONEX) 50 MCG/ACT nasal spray, Place 2 sprays into the nose daily., Disp: , Rfl:  .  omeprazole (PRILOSEC) 20 MG capsule, TAKE ONE CAPSULE BY MOUTH ONCE DAILY., Disp: 90 capsule, Rfl: 3 .  OVER THE COUNTER MEDICATION, , Disp: , Rfl:   Allergies  Allergen Reactions  . Demerol [Meperidine] Nausea And Vomiting   Review of Systems Objective:   Vitals:   01/20/18 0910  BP: (!) 149/80  Pulse: 78  Resp: 16    General: Well developed, nourished, in no acute distress, alert and oriented x3   Dermatological: Skin is warm, dry and supple bilateral. Nails x 10 are well maintained; remaining integument appears unremarkable at this time. There are no open sores, no preulcerative lesions, no rash or signs of infection  present.  Reactive porokeratotic lesion plantar aspect of the forefoot.  No open lesions or wounds.  Vascular: Dorsalis Pedis artery and Posterior Tibial artery pedal pulses are 2/4 bilateral with immedate capillary fill time. Pedal hair growth present. No varicosities and no lower extremity edema present bilateral.   Neruologic: Grossly intact via light touch bilateral. Vibratory intact via tuning fork bilateral. Protective threshold with Semmes Wienstein monofilament intact to all pedal sites bilateral. Patellar and Achilles deep tendon reflexes 2+ bilateral. No Babinski or clonus noted bilateral.   Musculoskeletal: No gross  boney pedal deformities bilateral. No pain, crepitus, or limitation noted with foot and ankle range of motion bilateral. Muscular strength 5/5 in all groups tested bilateral.  He has pain on palpation of the fifth metatarsal phalangeal joint of the left foot some tenderness on range of motion.  He also has pain on palpation to the third interdigital space left.  Palpable Mulder's click is noted.    Gait: Unassisted, Nonantalgic.    Radiographs:  No acute findings bilateral foot and ankle mild pes planus is noted right over left.  Midfoot break is noted.  Assessment & Plan:   Assessment: Pes planus.  Neuroma third interdigital space left foot.  Capsulitis with overlapping neuritis fifth metatarsal phalangeal joint left.  Plan: After sterile Betadine skin prep injected 20 mg of Kenalog 5 mg Marcaine third interdigital space of the left foot.  Also injected 2 mg of dexamethasone and local anesthetic to the left foot.  This was injected around the fifth metatarsal phalangeal joint and I debrided the reactive hyperkeratotic lesion.  No open lesions are noted.     Max T. [place], [place]

## 2018-01-23 ENCOUNTER — Encounter (INDEPENDENT_AMBULATORY_CARE_PROVIDER_SITE_OTHER): Payer: Self-pay

## 2018-02-19 ENCOUNTER — Ambulatory Visit: Payer: Medicare Other | Admitting: Podiatry

## 2018-02-22 DIAGNOSIS — K6289 Other specified diseases of anus and rectum: Secondary | ICD-10-CM | POA: Insufficient documentation

## 2018-03-20 ENCOUNTER — Other Ambulatory Visit: Payer: Self-pay | Admitting: Family Medicine

## 2018-03-20 NOTE — Telephone Encounter (Signed)
Requesting refill    Lorazepam  LOV: 12/29/17  LRF:  12/29/17

## 2018-04-27 ENCOUNTER — Ambulatory Visit: Payer: Medicare Other | Admitting: Family Medicine

## 2018-05-14 ENCOUNTER — Other Ambulatory Visit: Payer: Medicare Other

## 2018-05-14 DIAGNOSIS — N289 Disorder of kidney and ureter, unspecified: Secondary | ICD-10-CM

## 2018-05-15 ENCOUNTER — Encounter (INDEPENDENT_AMBULATORY_CARE_PROVIDER_SITE_OTHER): Payer: Self-pay

## 2018-05-15 LAB — BASIC METABOLIC PANEL
BUN: 21 mg/dL (ref 7–25)
CO2: 25 mmol/L (ref 20–32)
CREATININE: 1.16 mg/dL (ref 0.70–1.25)
Calcium: 9.1 mg/dL (ref 8.6–10.3)
Chloride: 105 mmol/L (ref 98–110)
GLUCOSE: 98 mg/dL (ref 65–99)
Potassium: 4.7 mmol/L (ref 3.5–5.3)
Sodium: 141 mmol/L (ref 135–146)

## 2018-05-18 ENCOUNTER — Encounter: Payer: Self-pay | Admitting: Family Medicine

## 2018-05-18 ENCOUNTER — Ambulatory Visit: Payer: Medicare Other | Admitting: Family Medicine

## 2018-05-18 VITALS — BP 144/78 | HR 80 | Temp 98.0°F | Resp 18 | Wt 202.0 lb

## 2018-05-18 DIAGNOSIS — K6289 Other specified diseases of anus and rectum: Secondary | ICD-10-CM | POA: Diagnosis not present

## 2018-05-18 DIAGNOSIS — G4733 Obstructive sleep apnea (adult) (pediatric): Secondary | ICD-10-CM | POA: Diagnosis not present

## 2018-05-18 DIAGNOSIS — N289 Disorder of kidney and ureter, unspecified: Secondary | ICD-10-CM | POA: Diagnosis not present

## 2018-05-18 NOTE — Progress Notes (Signed)
Subjective:    Patient ID: Dwayne Young, male    DOB: [birth year], 67 y.o.   MRN: [id number]  Medication Refill     12/29/17 There has been a decline in his renal function.  He has been taking diclofenac regularly for osteoarthritic pain in his neck and his hands and his feet.  This may account for this decline in his renal function.  He also complains of numbness and tingling in all 5 toes on both feet distal to the MTP joints.  This is a chronic finding.  He is also complaining of pain over the lateral aspect of the fifth MTP joint on the left foot where he has a bunion developing.  He would like to see podiatry to discuss options to treat this.  His lab work listed below is also significant for elevated cholesterol.  His 10-year risk of cardiovascular disease is greater than 14% suggesting that he should be on a statin.  I discussed these risk with him today.  At that time, my plan was: We discussed the risk of cardiovascular disease. His 10 year risk of cardiovascular diseases 14% with his current cholesterol. Recommendations would be to start a statin. He politely declines this.  His blood pressure is acceptable.  His renal function has deteriorated.  He will avoid diclofenac and drink more water and recheck renal function in 2 weeks.  Given the neuropathy in his feet, I will also check a B12 level in 2 weeks to rule out B12 deficiency.  However I believe this is most likely chronic peripheral neuropathy.  Due to the bunion on his left lateral fifth MTP joint, I will consult podiatry to discuss surgical treatment.  I have recommended that the patient discontinue omeprazole since he is no longer taking diclofenac.  He can use Voltaren gel 2 g 4 times a day as needed on his joints for arthritic pain.  He can also use Tylenol  05/18/18 On recent labs, renal function has improved.  He is avoiding NSAIDs and drinking plenty of water and it has seemingly corrected the problem Lab on 05/14/2018    Component Date Value Ref Range Status  . Glucose, Bld 05/14/2018 98  65 - 99 mg/dL Final   Comment: .            Fasting reference interval .   . BUN 05/14/2018 21  7 - 25 mg/dL Final  . Creat 05/14/2018 1.16  0.70 - 1.25 mg/dL Final   Comment: For patients >13 years of age, the reference limit for Creatinine is approximately 13% higher for people identified as African-American. .   Havery Moros Ratio 41/63/8453 NOT APPLICABLE  6 - 22 (calc) Final  . Sodium 05/14/2018 141  135 - 146 mmol/L Final  . Potassium 05/14/2018 4.7  3.5 - 5.3 mmol/L Final  . Chloride 05/14/2018 105  98 - 110 mmol/L Final  . CO2 05/14/2018 25  20 - 32 mmol/L Final  . Calcium 05/14/2018 9.1  8.6 - 10.3 mg/dL Final  He has 3 concerns today.  First, his wife is unable to sleep with him any longer because of the volume and severity of his snoring.  She has even heard him stop breathing.  He states that he awakens 4-5 times every evening due to snoring.  Occasionally he awakens due to difficulty breathing.  There have been witnessed episodes of apnea.  However his Epworth sleepiness score is relatively benign.  He denies any excessive hypersomnolence.  He denies  feeling as though he can fall asleep easily while reading, watching TV, talking, riding in a car, after lunch, driving.  Epworth sleepiness score is calculated to be 3-4.  Second concern is a perianal mass.  On examination, there is a 5 mm yellow cystlike mass on the left side of his rectum at roughly 9:00 with the patient standing leaning over the exam table.  It is soft.  It is fluctuant.  It appears to be a cyst.  It is nontender.  It is well-circumscribed.  Patient states that it is been there now for more than a year.  Third issue is a varicose vein on his left lower anterior shin.  It is approximately 1 cm in diameter.  It is fluctuant, nontender, noncalcified, it is difficult to determine if it is a cyst or varicose vein however it is benign in  appearance Past Medical History:  Diagnosis Date  . Arthritis    arthritis-generalized-knees, elbows  . Elevated PSA   . Hypercholesteremia   . Hyperlipidemia   . Prostate cancer ([place])    dx. prostate cancer after bx. 2'15-now surgery planned  . Retinal detachment   . Seasonal allergies    Past Surgical History:  Procedure Laterality Date  . COLONOSCOPY N/A 04/27/2013   Procedure: COLONOSCOPY;  Surgeon: Jamesetta So, MD;  Location: [facility];  Service: Gastroenterology;  Laterality: N/A;  . GANGLION CYST EXCISION Right   . KNEE ARTHROSCOPY Bilateral    open procedure  . LYMPHADENECTOMY Bilateral 03/03/2014   Procedure: LYMPHADENECTOMY;  Surgeon: Raynelle Bring, MD;  Location: [facility];  Service: Urology;  Laterality: Bilateral;  . NASAL SEPTOPLASTY W/ TURBINOPLASTY    . ROBOT ASSISTED LAPAROSCOPIC RADICAL PROSTATECTOMY N/A 03/03/2014   Procedure: ROBOTIC ASSISTED LAPAROSCOPIC RADICAL PROSTATECTOMY LEVEL 2;  Surgeon: Raynelle Bring, MD;  Location: [facility];  Service: Urology;  Laterality: N/A;  . TONSILLECTOMY     child  . TOOTH EXTRACTION     preparing for tooth implant   Current Outpatient Medications on File Prior to Visit  Medication Sig Dispense Refill  . aspirin 81 MG tablet Take 81 mg by mouth daily.    . cromolyn (NASALCROM) 5.2 MG/ACT nasal spray Place 1 spray into both nostrils 4 (four) times daily.    . diclofenac sodium (VOLTAREN) 1 % GEL Apply 2 g topically 4 (four) times daily. 100 g 4  . LORazepam (ATIVAN) 1 MG tablet Take 1 tablet (1 mg total) by mouth 2 (two) times daily as needed for anxiety. 20 tablet 1  . Misc Natural Products (BLACK CHERRY CONCENTRATE PO) Take by mouth.    . mometasone (NASONEX) 50 MCG/ACT nasal spray Place 2 sprays into the nose daily.    Marland Kitchen omeprazole (PRILOSEC) 20 MG capsule TAKE ONE CAPSULE BY MOUTH ONCE DAILY. 90 capsule 3   No current facility-administered medications on file prior to visit.      Allergies  Allergen Reactions  .  Demerol [Meperidine] Nausea And Vomiting   Social History   Socioeconomic History  . Marital status: Married    Spouse name: Not on file  . Number of children: Not on file  . Years of education: Not on file  . Highest education level: Not on file  Occupational History  . Not on file  Social Needs  . Financial resource strain: Not on file  . Food insecurity:    Worry: Not on file    Inability: Not on file  . Transportation needs:  Medical: Not on file    Non-medical: Not on file  Tobacco Use  . Smoking status: Former Smoker    Packs/day: 0.50    Years: 15.00    Pack years: 7.50    Types: Cigarettes    Last attempt to quit: 04/27/1989    Years since quitting: 29.0  . Smokeless tobacco: Never Used  Substance and Sexual Activity  . Alcohol use: Yes    Alcohol/week: 0.6 oz    Types: 1 Glasses of wine per week    Comment: moderate - wine every 3 rd day  . Drug use: No  . Sexual activity: Yes    Birth control/protection: None  Lifestyle  . Physical activity:    Days per week: Not on file    Minutes per session: Not on file  . Stress: Not on file  Relationships  . Social connections:    Talks on phone: Not on file    Gets together: Not on file    Attends religious service: Not on file    Active member of club or organization: Not on file    Attends meetings of clubs or organizations: Not on file    Relationship status: Not on file  . Intimate partner violence:    Fear of current or ex partner: Not on file    Emotionally abused: Not on file    Physically abused: Not on file    Forced sexual activity: Not on file  Other Topics Concern  . Not on file  Social History Narrative  . Not on file   No family history on file.    Review of Systems  All other systems reviewed and are negative.      Objective:   Physical Exam  Constitutional: He is oriented to person, place, and time. He appears well-developed and well-nourished. No distress.  HENT:  Head:  Normocephalic and atraumatic.  Mouth/Throat: Oropharynx is clear and moist.  Cardiovascular: Normal rate, regular rhythm, normal heart sounds and intact distal pulses. Exam reveals no gallop and no friction rub.  No murmur heard. Pulmonary/Chest: Effort normal and breath sounds normal. No respiratory distress. He has no wheezes. He has no rales. He exhibits no tenderness.  Genitourinary:     Musculoskeletal: Normal range of motion. He exhibits no tenderness.  Neurological: He is alert and oriented to person, place, and time. He has normal reflexes. No cranial nerve deficit. He exhibits normal muscle tone. Coordination normal.  Skin: Skin is warm. No rash noted. He is not diaphoretic. No erythema. No pallor.  Vitals reviewed.  See description in history of present illness       Assessment & Plan:  Acute renal insufficiency  Obstructive sleep apnea syndrome - Plan: Ambulatory referral to Sleep Studies  Perianal cyst  Renal insufficiency has normalized.  I attribute this to age, possible dehydration, and NSAID overuse.  Monitored every 6 months now that is stable and avoid NSAIDs.  I will consult neurology for a sleep study given the witnessed apneic episodes despite a normal Epworth sleepiness score.  Perianal cyst is small and appears benign on exam.  We have decided to clinically monitor this area.  If it changes rapidly, becomes painful, bleeds, etc., I would recommend general surgery consultation for excision

## 2018-07-28 ENCOUNTER — Ambulatory Visit (INDEPENDENT_AMBULATORY_CARE_PROVIDER_SITE_OTHER): Payer: Medicare Other

## 2018-07-28 DIAGNOSIS — Z23 Encounter for immunization: Secondary | ICD-10-CM

## 2018-07-28 NOTE — Progress Notes (Signed)
Patient was in office and received his high dose flu vaccine.Patient received vaccine in his let deltoid.Patient tolerated well.

## 2018-09-02 ENCOUNTER — Other Ambulatory Visit: Payer: Medicare Other

## 2018-09-02 DIAGNOSIS — C61 Malignant neoplasm of prostate: Secondary | ICD-10-CM

## 2018-09-02 DIAGNOSIS — R944 Abnormal results of kidney function studies: Secondary | ICD-10-CM

## 2018-09-02 DIAGNOSIS — E7800 Pure hypercholesterolemia, unspecified: Secondary | ICD-10-CM

## 2018-09-02 LAB — CBC WITH DIFFERENTIAL/PLATELET
BASOS ABS: 28 {cells}/uL (ref 0–200)
Basophils Relative: 0.5 %
EOS PCT: 2.2 %
Eosinophils Absolute: 121 cells/uL (ref 15–500)
HEMATOCRIT: 42.5 % (ref 38.5–50.0)
HEMOGLOBIN: 14.3 g/dL (ref 13.2–17.1)
LYMPHS ABS: 1975 {cells}/uL (ref 850–3900)
MCH: 30.7 pg (ref 27.0–33.0)
MCHC: 33.6 g/dL (ref 32.0–36.0)
MCV: 91.2 fL (ref 80.0–100.0)
MPV: 10 fL (ref 7.5–12.5)
Monocytes Relative: 9.7 %
NEUTROS ABS: 2844 {cells}/uL (ref 1500–7800)
NEUTROS PCT: 51.7 %
Platelets: 231 10*3/uL (ref 140–400)
RBC: 4.66 10*6/uL (ref 4.20–5.80)
RDW: 12.7 % (ref 11.0–15.0)
Total Lymphocyte: 35.9 %
WBC: 5.5 10*3/uL (ref 3.8–10.8)
WBCMIX: 534 {cells}/uL (ref 200–950)

## 2018-09-02 LAB — COMPREHENSIVE METABOLIC PANEL
AG Ratio: 1.6 (calc) (ref 1.0–2.5)
ALBUMIN MSPROF: 3.8 g/dL (ref 3.6–5.1)
ALKALINE PHOSPHATASE (APISO): 58 U/L (ref 40–115)
ALT: 11 U/L (ref 9–46)
AST: 17 U/L (ref 10–35)
BUN: 19 mg/dL (ref 7–25)
CALCIUM: 8.9 mg/dL (ref 8.6–10.3)
CO2: 27 mmol/L (ref 20–32)
CREATININE: 1.23 mg/dL (ref 0.70–1.25)
Chloride: 105 mmol/L (ref 98–110)
Globulin: 2.4 g/dL (calc) (ref 1.9–3.7)
Glucose, Bld: 91 mg/dL (ref 65–99)
POTASSIUM: 4.6 mmol/L (ref 3.5–5.3)
SODIUM: 141 mmol/L (ref 135–146)
Total Bilirubin: 0.5 mg/dL (ref 0.2–1.2)
Total Protein: 6.2 g/dL (ref 6.1–8.1)

## 2018-09-02 LAB — LIPID PANEL
CHOL/HDL RATIO: 3.7 (calc) (ref <5.0)
Cholesterol: 169 mg/dL (ref <200)
HDL: 46 mg/dL (ref >40)
LDL Cholesterol (Calc): 105 mg/dL (calc) — ABNORMAL HIGH
NON-HDL CHOLESTEROL (CALC): 123 mg/dL (ref <130)
Triglycerides: 88 mg/dL (ref <150)

## 2018-09-02 LAB — PSA: PSA: <0.1 ng/mL (ref <=4.0)

## 2018-09-04 ENCOUNTER — Encounter: Payer: Self-pay | Admitting: Family Medicine

## 2018-09-04 ENCOUNTER — Ambulatory Visit (INDEPENDENT_AMBULATORY_CARE_PROVIDER_SITE_OTHER): Payer: Medicare Other | Admitting: Family Medicine

## 2018-09-04 VITALS — BP 130/80 | HR 62 | Temp 98.0°F | Resp 14 | Ht 75.0 in | Wt 198.0 lb

## 2018-09-04 DIAGNOSIS — Z8546 Personal history of malignant neoplasm of prostate: Secondary | ICD-10-CM | POA: Diagnosis not present

## 2018-09-04 DIAGNOSIS — E7800 Pure hypercholesterolemia, unspecified: Secondary | ICD-10-CM

## 2018-09-04 MED ORDER — LORAZEPAM 1 MG PO TABS: 1.0000 mg | ORAL_TABLET | Freq: Two times a day (BID) | ORAL | 1 refills | Status: DC | PRN

## 2018-09-04 NOTE — Progress Notes (Signed)
Subjective:    Patient ID: Dwayne Young, male    DOB: 11-18-1950, 67 y.o.   MRN: [id number]  Medication Refill     12/29/17 There has been a decline in his renal function.  He has been taking diclofenac regularly for osteoarthritic pain in his neck and his hands and his feet.  This may account for this decline in his renal function.  He also complains of numbness and tingling in all 5 toes on both feet distal to the MTP joints.  This is a chronic finding.  He is also complaining of pain over the lateral aspect of the fifth MTP joint on the left foot where he has a bunion developing.  He would like to see podiatry to discuss options to treat this.  His lab work listed below is also significant for elevated cholesterol.  His 10-year risk of cardiovascular disease is greater than 14% suggesting that he should be on a statin.  I discussed these risk with him today.  At that time, my plan was: We discussed the risk of cardiovascular disease. His 10 year risk of cardiovascular diseases 14% with his current cholesterol. Recommendations would be to start a statin. He politely declines this.  His blood pressure is acceptable.  His renal function has deteriorated.  He will avoid diclofenac and drink more water and recheck renal function in 2 weeks.  Given the neuropathy in his feet, I will also check a B12 level in 2 weeks to rule out B12 deficiency.  However I believe this is most likely chronic peripheral neuropathy.  Due to the bunion on his left lateral fifth MTP joint, I will consult podiatry to discuss surgical treatment.  I have recommended that the patient discontinue omeprazole since he is no longer taking diclofenac.  He can use Voltaren gel 2 g 4 times a day as needed on his joints for arthritic pain.  He can also use Tylenol  05/18/18 On recent labs, renal function has improved.  He is avoiding NSAIDs and drinking plenty of water and it has seemingly corrected the problem He has 3 concerns today.   First, his wife is unable to sleep with him any longer because of the volume and severity of his snoring.  She has even heard him stop breathing.  He states that he awakens 4-5 times every evening due to snoring.  Occasionally he awakens due to difficulty breathing.  There have been witnessed episodes of apnea.  However his Epworth sleepiness score is relatively benign.  He denies any excessive hypersomnolence.  He denies feeling as though he can fall asleep easily while reading, watching TV, talking, riding in a car, after lunch, driving.  Epworth sleepiness score is calculated to be 3-4.  Second concern is a perianal mass.  On examination, there is a 5 mm yellow cystlike mass on the left side of his rectum at roughly 9:00 with the patient standing leaning over the exam table.  It is soft.  It is fluctuant.  It appears to be a cyst.  It is nontender.  It is well-circumscribed.  Patient states that it is been there now for more than a year.  Third issue is a varicose vein on his left lower anterior shin.  It is approximately 1 cm in diameter.  It is fluctuant, nontender, noncalcified, it is difficult to determine if it is a cyst or varicose vein however it is benign in appearance.  At that time, my plan was: Renal insufficiency has  normalized.  I attribute this to age, possible dehydration, and NSAID overuse.  Monitored every 6 months now that is stable and avoid NSAIDs.  I will consult neurology for a sleep study given the witnessed apneic episodes despite a normal Epworth sleepiness score.  Perianal cyst is small and appears benign on exam.  We have decided to clinically monitor this area.  If it changes rapidly, becomes painful, bleeds, etc., I would recommend general surgery consultation for excision  09/04/18 Most recent labs are listed below: Lab on 09/02/2018  Component Date Value Ref Range Status  . Cholesterol 09/02/2018 169  <200 mg/dL Final  . HDL 09/02/2018 46  >40 mg/dL Final  . Triglycerides  09/02/2018 88  <150 mg/dL Final  . LDL Cholesterol (Calc) 09/02/2018 105* mg/dL (calc) Final   Comment: Reference range: <100 . Desirable range <100 mg/dL for primary prevention;   <70 mg/dL for patients with CHD or diabetic patients  with > or = 2 CHD risk factors. Marland Kitchen LDL-C is now calculated using the Martin-Hopkins  calculation, which is a validated novel method providing  better accuracy than the Friedewald equation in the  estimation of LDL-C.  Cresenciano Genre et al. Annamaria Helling. 5277;824(23): 2061-2068  (http://education.[facility].com/faq/FAQ164)   . Total CHOL/HDL Ratio 09/02/2018 3.7  <5.0 (calc) Final  . Non-HDL Cholesterol (Calc) 09/02/2018 123  <130 mg/dL (calc) Final   Comment: For patients with diabetes plus 1 major ASCVD risk  factor, treating to a non-HDL-C goal of <100 mg/dL  (LDL-C of <70 mg/dL) is considered a therapeutic  option.   . Glucose, Bld 09/02/2018 91  65 - 99 mg/dL Final   Comment: .            Fasting reference interval .   . BUN 09/02/2018 19  7 - 25 mg/dL Final  . Creat 09/02/2018 1.23  0.70 - 1.25 mg/dL Final   Comment: For patients >5 years of age, the reference limit for Creatinine is approximately 13% higher for people identified as African-American. .   Havery Moros Ratio 53/61/4431 NOT APPLICABLE  6 - 22 (calc) Final  . Sodium 09/02/2018 141  135 - 146 mmol/L Final  . Potassium 09/02/2018 4.6  3.5 - 5.3 mmol/L Final  . Chloride 09/02/2018 105  98 - 110 mmol/L Final  . CO2 09/02/2018 27  20 - 32 mmol/L Final  . Calcium 09/02/2018 8.9  8.6 - 10.3 mg/dL Final  . Total Protein 09/02/2018 6.2  6.1 - 8.1 g/dL Final  . Albumin 09/02/2018 3.8  3.6 - 5.1 g/dL Final  . Globulin 09/02/2018 2.4  1.9 - 3.7 g/dL (calc) Final  . AG Ratio 09/02/2018 1.6  1.0 - 2.5 (calc) Final  . Total Bilirubin 09/02/2018 0.5  0.2 - 1.2 mg/dL Final  . Alkaline phosphatase (APISO) 09/02/2018 58  40 - 115 U/L Final  . AST 09/02/2018 17  10 - 35 U/L Final  . ALT  09/02/2018 11  9 - 46 U/L Final  . WBC 09/02/2018 5.5  3.8 - 10.8 Thousand/uL Final  . RBC 09/02/2018 4.66  4.20 - 5.80 Million/uL Final  . Hemoglobin 09/02/2018 14.3  13.2 - 17.1 g/dL Final  . HCT 09/02/2018 42.5  38.5 - 50.0 % Final  . MCV 09/02/2018 91.2  80.0 - 100.0 fL Final  . MCH 09/02/2018 30.7  27.0 - 33.0 pg Final  . MCHC 09/02/2018 33.6  32.0 - 36.0 g/dL Final  . RDW 09/02/2018 12.7  11.0 - 15.0 % Final  .  Platelets 09/02/2018 231  140 - 400 Thousand/uL Final  . MPV 09/02/2018 10.0  7.5 - 12.5 fL Final  . Neutro Abs 09/02/2018 2,844  1,500 - 7,800 cells/uL Final  . Lymphs Abs 09/02/2018 1,975  850 - 3,900 cells/uL Final  . WBC mixed population 09/02/2018 534  200 - 950 cells/uL Final  . Eosinophils Absolute 09/02/2018 121  15 - 500 cells/uL Final  . Basophils Absolute 09/02/2018 28  0 - 200 cells/uL Final  . Neutrophils Relative % 09/02/2018 51.7  % Final  . Total Lymphocyte 09/02/2018 35.9  % Final  . Monocytes Relative 09/02/2018 9.7  % Final  . Eosinophils Relative 09/02/2018 2.2  % Final  . Basophils Relative 09/02/2018 0.5  % Final  . PSA 09/02/2018 <0.1  < OR = 4.0 ng/mL Final   Comment: The total PSA value from this assay system is  standardized against the WHO standard. The test  result will be approximately 20% lower when compared  to the equimolar-standardized total PSA (Beckman  Coulter). Comparison of serial PSA results should be  interpreted with this fact in mind. . This test was performed using the Siemens  chemiluminescent method. Values obtained from  different assay methods cannot be used interchangeably. PSA levels, regardless of value, should not be interpreted as absolute evidence of the presence or absence of disease.    Patient's cholesterol has improved dramatically.  His LDL cholesterol has dropped more than 40 points.  Total cholesterol has fallen from 222-160 area HDL cholesterol remains greater than 40.  I am very impressed with his  cholesterol numbers.  He has mild chronic kidney disease borderline stage III however his creatinine remains in a normal level after he has discontinued NSAIDs.  He denies any chest pain shortness of breath or dyspnea on exertion.  Immunizations are up-to-date.  Colonoscopy is up-to-date.  Patient denies any problems today.  The cyst around his rectum is unchanged and causing him no problems. Past Medical History:  Diagnosis Date  . Arthritis    arthritis-generalized-knees, elbows  . Elevated PSA   . Hypercholesteremia   . Hyperlipidemia   . Prostate cancer ([place])    dx. prostate cancer after bx. 2'15-now surgery planned  . Retinal detachment   . Seasonal allergies    Past Surgical History:  Procedure Laterality Date  . COLONOSCOPY N/A 04/27/2013   Procedure: COLONOSCOPY;  Surgeon: Jamesetta So, MD;  [facility];  Service: Gastroenterology;  Laterality: N/A;  . GANGLION CYST EXCISION Right   . KNEE ARTHROSCOPY Bilateral    open procedure  . LYMPHADENECTOMY Bilateral 03/03/2014   Procedure: LYMPHADENECTOMY;  Surgeon: Raynelle Bring, MD;  Location: [facility];  Service: Urology;  Laterality: Bilateral;  . NASAL SEPTOPLASTY W/ TURBINOPLASTY    . ROBOT ASSISTED LAPAROSCOPIC RADICAL PROSTATECTOMY N/A 03/03/2014   Procedure: ROBOTIC ASSISTED LAPAROSCOPIC RADICAL PROSTATECTOMY LEVEL 2;  Surgeon: Raynelle Bring, MD;  Location: [facility];  Service: Urology;  Laterality: N/A;  . TONSILLECTOMY     child  . TOOTH EXTRACTION     preparing for tooth implant   Current Outpatient Medications on File Prior to Visit  Medication Sig Dispense Refill  . cromolyn (NASALCROM) 5.2 MG/ACT nasal spray Place 1 spray into both nostrils 4 (four) times daily.    . diclofenac sodium (VOLTAREN) 1 % GEL Apply 2 g topically 4 (four) times daily. 100 g 4  . LORazepam (ATIVAN) 1 MG tablet Take 1 tablet (1 mg total) by mouth 2 (two) times  daily as needed for anxiety. 20 tablet 1  . Misc Natural Products (BLACK CHERRY  CONCENTRATE PO) Take by mouth.    . mometasone (NASONEX) 50 MCG/ACT nasal spray Place 2 sprays into the nose daily.     No current facility-administered medications on file prior to visit.      Allergies  Allergen Reactions  . Demerol [Meperidine] Nausea And Vomiting   Social History   Socioeconomic History  . Marital status: Married    Spouse name: Not on file  . Number of children: Not on file  . Years of education: Not on file  . Highest education level: Not on file  Occupational History  . Not on file  Social Needs  . Financial resource strain: Not on file  . Food insecurity:    Worry: Not on file    Inability: Not on file  . Transportation needs:    Medical: Not on file    Non-medical: Not on file  Tobacco Use  . Smoking status: Former Smoker    Packs/day: 0.50    Years: 15.00    Pack years: 7.50    Types: Cigarettes    Last attempt to quit: 04/27/1989    Years since quitting: 29.3  . Smokeless tobacco: Never Used  Substance and Sexual Activity  . Alcohol use: Yes    Alcohol/week: 1.0 standard drinks    Types: 1 Glasses of wine per week    Comment: moderate - wine every 3 rd day  . Drug use: No  . Sexual activity: Yes    Birth control/protection: None  Lifestyle  . Physical activity:    Days per week: Not on file    Minutes per session: Not on file  . Stress: Not on file  Relationships  . Social connections:    Talks on phone: Not on file    Gets together: Not on file    Attends religious service: Not on file    Active member of club or organization: Not on file    Attends meetings of clubs or organizations: Not on file    Relationship status: Not on file  . Intimate partner violence:    Fear of current or ex partner: Not on file    Emotionally abused: Not on file    Physically abused: Not on file    Forced sexual activity: Not on file  Other Topics Concern  . Not on file  Social History Narrative  . Not on file   No family history on  file.    Review of Systems  All other systems reviewed and are negative.      Objective:   Physical Exam  Constitutional: He is oriented to person, place, and time. He appears well-developed and well-nourished. No distress.  HENT:  Head: Normocephalic and atraumatic.  Mouth/Throat: Oropharynx is clear and moist.  Cardiovascular: Normal rate, regular rhythm, normal heart sounds and intact distal pulses. Exam reveals no gallop and no friction rub.  No murmur heard. Pulmonary/Chest: Effort normal and breath sounds normal. No respiratory distress. He has no wheezes. He has no rales. He exhibits no tenderness.  Musculoskeletal: Normal range of motion. He exhibits no tenderness.  Neurological: He is alert and oriented to person, place, and time. He has normal reflexes. No cranial nerve deficit. He exhibits normal muscle tone. Coordination normal.  Skin: Skin is warm. No rash noted. He is not diaphoretic. No erythema. No pallor.  Vitals reviewed.      Assessment & Plan:  Pure hypercholesterolemia  Personal history of prostate cancer  PSA remains undetectable.  I will check this every 6 months.  Cholesterol is excellent.  I would monitor this every 6 months along with his kidney function.  Immunization and cancer screening are up-to-date.  Patient requested a refill on the lorazepam that he uses sparingly only when traveling.  I will be glad to fill this for him at this time.

## 2019-03-12 ENCOUNTER — Other Ambulatory Visit: Payer: Self-pay | Admitting: Family Medicine

## 2019-03-12 NOTE — Telephone Encounter (Signed)
Requesting refill    Lorazepam  LOV:  09/04/18 LRF:  09/04/18

## 2019-04-29 ENCOUNTER — Other Ambulatory Visit: Payer: Medicare Other

## 2019-04-29 ENCOUNTER — Other Ambulatory Visit: Payer: Self-pay

## 2019-04-29 DIAGNOSIS — E785 Hyperlipidemia, unspecified: Secondary | ICD-10-CM

## 2019-04-29 DIAGNOSIS — C61 Malignant neoplasm of prostate: Secondary | ICD-10-CM

## 2019-04-29 DIAGNOSIS — R972 Elevated prostate specific antigen [PSA]: Secondary | ICD-10-CM

## 2019-04-29 DIAGNOSIS — Z0000 Encounter for general adult medical examination without abnormal findings: Secondary | ICD-10-CM

## 2019-04-29 DIAGNOSIS — Z8546 Personal history of malignant neoplasm of prostate: Secondary | ICD-10-CM

## 2019-04-29 DIAGNOSIS — E7800 Pure hypercholesterolemia, unspecified: Secondary | ICD-10-CM

## 2019-04-30 LAB — COMPREHENSIVE METABOLIC PANEL
AG Ratio: 1.5 (calc) (ref 1.0–2.5)
ALT: 14 U/L (ref 9–46)
AST: 16 U/L (ref 10–35)
Albumin: 4 g/dL (ref 3.6–5.1)
Alkaline phosphatase (APISO): 66 U/L (ref 35–144)
BUN: 22 mg/dL (ref 7–25)
CO2: 24 mmol/L (ref 20–32)
Calcium: 9 mg/dL (ref 8.6–10.3)
Chloride: 107 mmol/L (ref 98–110)
Creat: 1.16 mg/dL (ref 0.70–1.25)
Globulin: 2.6 g/dL (calc) (ref 1.9–3.7)
Glucose, Bld: 93 mg/dL (ref 65–99)
Potassium: 4.8 mmol/L (ref 3.5–5.3)
Sodium: 140 mmol/L (ref 135–146)
Total Bilirubin: 0.6 mg/dL (ref 0.2–1.2)
Total Protein: 6.6 g/dL (ref 6.1–8.1)

## 2019-04-30 LAB — CBC WITH DIFFERENTIAL/PLATELET
Absolute Monocytes: 440 cells/uL (ref 200–950)
Basophils Absolute: 28 cells/uL (ref 0–200)
Basophils Relative: 0.5 %
Eosinophils Absolute: 83 cells/uL (ref 15–500)
Eosinophils Relative: 1.5 %
HCT: 45 % (ref 38.5–50.0)
Hemoglobin: 15.1 g/dL (ref 13.2–17.1)
Lymphs Abs: 1826 cells/uL (ref 850–3900)
MCH: 30.9 pg (ref 27.0–33.0)
MCHC: 33.6 g/dL (ref 32.0–36.0)
MCV: 92.2 fL (ref 80.0–100.0)
MPV: 9.9 fL (ref 7.5–12.5)
Monocytes Relative: 8 %
Neutro Abs: 3124 cells/uL (ref 1500–7800)
Neutrophils Relative %: 56.8 %
Platelets: 255 10*3/uL (ref 140–400)
RBC: 4.88 10*6/uL (ref 4.20–5.80)
RDW: 13.4 % (ref 11.0–15.0)
Total Lymphocyte: 33.2 %
WBC: 5.5 10*3/uL (ref 3.8–10.8)

## 2019-04-30 LAB — LIPID PANEL
Cholesterol: 228 mg/dL — ABNORMAL HIGH (ref <200)
HDL: 51 mg/dL (ref >=40)
LDL Cholesterol (Calc): 152 mg/dL (calc) — ABNORMAL HIGH
Non-HDL Cholesterol (Calc): 177 mg/dL (calc) — ABNORMAL HIGH (ref <130)
Total CHOL/HDL Ratio: 4.5 (calc) (ref <5.0)
Triglycerides: 124 mg/dL (ref <150)

## 2019-04-30 LAB — PSA: PSA: <0.1 ng/mL (ref <=4.0)

## 2019-05-04 ENCOUNTER — Encounter: Payer: Self-pay | Admitting: Family Medicine

## 2019-05-04 ENCOUNTER — Ambulatory Visit (INDEPENDENT_AMBULATORY_CARE_PROVIDER_SITE_OTHER): Payer: Medicare Other | Admitting: Family Medicine

## 2019-05-04 ENCOUNTER — Other Ambulatory Visit: Payer: Self-pay

## 2019-05-04 VITALS — BP 130/86 | HR 80 | Temp 98.5°F | Resp 14 | Ht 75.0 in | Wt 204.0 lb

## 2019-05-04 DIAGNOSIS — E7800 Pure hypercholesterolemia, unspecified: Secondary | ICD-10-CM | POA: Diagnosis not present

## 2019-05-04 DIAGNOSIS — Z0001 Encounter for general adult medical examination with abnormal findings: Secondary | ICD-10-CM | POA: Diagnosis not present

## 2019-05-04 DIAGNOSIS — Z8546 Personal history of malignant neoplasm of prostate: Secondary | ICD-10-CM

## 2019-05-04 DIAGNOSIS — Z0000 Encounter for general adult medical examination without abnormal findings: Secondary | ICD-10-CM

## 2019-05-04 MED ORDER — ROSUVASTATIN CALCIUM 5 MG PO TABS: 5.0000 mg | ORAL_TABLET | Freq: Every day | ORAL | 3 refills | Status: DC

## 2019-05-04 NOTE — Progress Notes (Signed)
Subjective:    Patient ID: Dwayne Young, male    DOB: Mar 05, 1951, 68 y.o.   MRN: [id number]  HPI Patient is here today for complete physical exam.  His last colonoscopy was performed in 2014 and was reportedly normal.  He is not due again for a colonoscopy until 2014.  Patient reports a growth to the left of his rectum that is causing him some pain and discomfort.  We were going to discuss this today on his physical however we got busy discussing his lab work and I completely forgot.  Patient also forgot and left prior to [place] performing a rectal exam.  Therefore I will have the patient come back at his convenience so that I can examine that area.  Most recent lab work is listed below: Lab on 04/29/2019  Component Date Value Ref Range Status  . Cholesterol 04/29/2019 228* <200 mg/dL Final  . HDL 04/29/2019 51  > OR = 40 mg/dL Final  . Triglycerides 04/29/2019 124  <150 mg/dL Final  . LDL Cholesterol (Calc) 04/29/2019 152* mg/dL (calc) Final   Comment: Reference range: <100 . Desirable range <100 mg/dL for primary prevention;   <70 mg/dL for patients with CHD or diabetic patients  with > or = 2 CHD risk factors. Marland Kitchen LDL-C is now calculated using the Martin-Hopkins  calculation, which is a validated novel method providing  better accuracy than the Friedewald equation in the  estimation of LDL-C.  Cresenciano Genre et al. Annamaria Helling. 6063;016(01): 2061-2068  (http://education.[facility].com/faq/FAQ164)   . Total CHOL/HDL Ratio 04/29/2019 4.5  <5.0 (calc) Final  . Non-HDL Cholesterol (Calc) 04/29/2019 177* <130 mg/dL (calc) Final   Comment: For patients with diabetes plus 1 major ASCVD risk  factor, treating to a non-HDL-C goal of <100 mg/dL  (LDL-C of <70 mg/dL) is considered a therapeutic  option.   . WBC 04/29/2019 5.5  3.8 - 10.8 Thousand/uL Final  . RBC 04/29/2019 4.88  4.20 - 5.80 Million/uL Final  . Hemoglobin 04/29/2019 15.1  13.2 - 17.1 g/dL Final  . HCT 04/29/2019 45.0  38.5 - 50.0 %  Final  . MCV 04/29/2019 92.2  80.0 - 100.0 fL Final  . MCH 04/29/2019 30.9  27.0 - 33.0 pg Final  . MCHC 04/29/2019 33.6  32.0 - 36.0 g/dL Final  . RDW 04/29/2019 13.4  11.0 - 15.0 % Final  . Platelets 04/29/2019 255  140 - 400 Thousand/uL Final  . MPV 04/29/2019 9.9  7.5 - 12.5 fL Final  . Neutro Abs 04/29/2019 3,124  1,500 - 7,800 cells/uL Final  . Lymphs Abs 04/29/2019 1,826  850 - 3,900 cells/uL Final  . Absolute Monocytes 04/29/2019 440  200 - 950 cells/uL Final  . Eosinophils Absolute 04/29/2019 83  15 - 500 cells/uL Final  . Basophils Absolute 04/29/2019 28  0 - 200 cells/uL Final  . Neutrophils Relative % 04/29/2019 56.8  % Final  . Total Lymphocyte 04/29/2019 33.2  % Final  . Monocytes Relative 04/29/2019 8.0  % Final  . Eosinophils Relative 04/29/2019 1.5  % Final  . Basophils Relative 04/29/2019 0.5  % Final  . Glucose, Bld 04/29/2019 93  65 - 99 mg/dL Final   Comment: .            Fasting reference interval .   . BUN 04/29/2019 22  7 - 25 mg/dL Final  . Creat 04/29/2019 1.16  0.70 - 1.25 mg/dL Final   Comment: For patients >65 years of age, the reference limit  for Creatinine is approximately 13% higher for people identified as African-American. .   Havery Moros Ratio 16/07/9603 NOT APPLICABLE  6 - 22 (calc) Final  . Sodium 04/29/2019 140  135 - 146 mmol/L Final  . Potassium 04/29/2019 4.8  3.5 - 5.3 mmol/L Final  . Chloride 04/29/2019 107  98 - 110 mmol/L Final  . CO2 04/29/2019 24  20 - 32 mmol/L Final  . Calcium 04/29/2019 9.0  8.6 - 10.3 mg/dL Final  . Total Protein 04/29/2019 6.6  6.1 - 8.1 g/dL Final  . Albumin 04/29/2019 4.0  3.6 - 5.1 g/dL Final  . Globulin 04/29/2019 2.6  1.9 - 3.7 g/dL (calc) Final  . AG Ratio 04/29/2019 1.5  1.0 - 2.5 (calc) Final  . Total Bilirubin 04/29/2019 0.6  0.2 - 1.2 mg/dL Final  . Alkaline phosphatase (APISO) 04/29/2019 66  35 - 144 U/L Final  . AST 04/29/2019 16  10 - 35 U/L Final  . ALT 04/29/2019 14  9 - 46 U/L Final  .  PSA 04/29/2019 <0.1  < OR = 4.0 ng/mL Final   Comment: The total PSA value from this assay system is  standardized against the WHO standard. The test  result will be approximately 20% lower when compared  to the equimolar-standardized total PSA (Beckman  Coulter). Comparison of serial PSA results should be  interpreted with this fact in mind. . This test was performed using the Siemens  chemiluminescent method. Values obtained from  different assay methods cannot be used interchangeably. PSA levels, regardless of value, should not be interpreted as absolute evidence of the presence or absence of disease.    There has been a substantial increase in his cholesterol as witnessed above.  His PSA remains undetectable Immunization History  Administered Date(s) Administered  . Hepatitis A 03/28/1999, 04/29/2011, 10/24/2011  . IPV 04/03/1999  . Influenza, High Dose Seasonal PF 07/28/2018  . Influenza-Unspecified 08/13/2015, 07/14/2017  . Pneumococcal Conjugate-13 04/04/2016  . Pneumococcal Polysaccharide-23 04/20/2015  . Tdap 04/29/2011  . Typhoid Inactivated 04/03/1999, 05/01/2011  . Yellow Fever 04/03/1999  . Zoster 03/15/2013   Past Surgical History:  Procedure Laterality Date  . COLONOSCOPY N/A 04/27/2013   Procedure: COLONOSCOPY;  Surgeon: Jamesetta So, MD;  [facility];  Service: Gastroenterology;  Laterality: N/A;  . GANGLION CYST EXCISION Right   . KNEE ARTHROSCOPY Bilateral    open procedure  . LYMPHADENECTOMY Bilateral 03/03/2014   Procedure: LYMPHADENECTOMY;  Surgeon: Raynelle Bring, MD;  Location: [facility];  Service: Urology;  Laterality: Bilateral;  . NASAL SEPTOPLASTY W/ TURBINOPLASTY    . ROBOT ASSISTED LAPAROSCOPIC RADICAL PROSTATECTOMY N/A 03/03/2014   Procedure: ROBOTIC ASSISTED LAPAROSCOPIC RADICAL PROSTATECTOMY LEVEL 2;  Surgeon: Raynelle Bring, MD;  Location: [facility];  Service: Urology;  Laterality: N/A;  . TONSILLECTOMY     child  . TOOTH EXTRACTION      preparing for tooth implant   Past Medical History:  Diagnosis Date  . Arthritis    arthritis-generalized-knees, elbows  . Elevated PSA   . Hypercholesteremia   . Hyperlipidemia   . Prostate cancer ([place])    dx. prostate cancer after bx. 2'15-now surgery planned  . Retinal detachment   . Seasonal allergies    Current Outpatient Medications on File Prior to Visit  Medication Sig Dispense Refill  . diclofenac sodium (VOLTAREN) 1 % GEL Apply 2 g topically 4 (four) times daily. 100 g 4  . LORazepam (ATIVAN) 1 MG tablet TAKE (1) TABLET BY MOUTH  TWICE DAILY AS NEEDED FOR ANXIETY. 20 tablet 0   No current facility-administered medications on file prior to visit.    Allergies  Allergen Reactions  . Demerol [Meperidine] Nausea And Vomiting   Social History   Socioeconomic History  . Marital status: Married    Spouse name: Not on file  . Number of children: Not on file  . Years of education: Not on file  . Highest education level: Not on file  Occupational History  . Not on file  Social Needs  . Financial resource strain: Not on file  . Food insecurity    Worry: Not on file    Inability: Not on file  . Transportation needs    Medical: Not on file    Non-medical: Not on file  Tobacco Use  . Smoking status: Former Smoker    Packs/day: 0.50    Years: 15.00    Pack years: 7.50    Types: Cigarettes    Quit date: 04/27/1989    Years since quitting: 30.0  . Smokeless tobacco: Never Used  Substance and Sexual Activity  . Alcohol use: Yes    Alcohol/week: 1.0 standard drinks    Types: 1 Glasses of wine per week    Comment: moderate - wine every 3 rd day  . Drug use: No  . Sexual activity: Yes    Birth control/protection: None  Lifestyle  . Physical activity    Days per week: Not on file    Minutes per session: Not on file  . Stress: Not on file  Relationships  . Social Herbalist on phone: Not on file    Gets together: Not on file    Attends religious  service: Not on file    Active member of club or organization: Not on file    Attends meetings of clubs or organizations: Not on file    Relationship status: Not on file  . Intimate partner violence    Fear of current or ex partner: Not on file    Emotionally abused: Not on file    Physically abused: Not on file    Forced sexual activity: Not on file  Other Topics Concern  . Not on file  Social History Narrative  . Not on file      Review of Systems  All other systems reviewed and are negative.      Objective:   Physical Exam Vitals signs reviewed.  Constitutional:      General: He is not in acute distress.    Appearance: Normal appearance. He is normal weight. He is not ill-appearing, toxic-appearing or diaphoretic.  HENT:     Head: Normocephalic and atraumatic.     Right Ear: Tympanic membrane, ear canal and external ear normal. There is no impacted cerumen.     Left Ear: Tympanic membrane, ear canal and external ear normal. There is no impacted cerumen.     Nose: Nose normal. No congestion or rhinorrhea.     Mouth/Throat:     Mouth: Mucous membranes are moist.     Pharynx: Oropharynx is clear. No oropharyngeal exudate or posterior oropharyngeal erythema.  Eyes:     General: No scleral icterus.       Right eye: No discharge.        Left eye: No discharge.     Extraocular Movements: Extraocular movements intact.     Conjunctiva/sclera: Conjunctivae normal.     Pupils: Pupils are equal, round, and reactive to light.  Neck:     Musculoskeletal: Normal range of motion and neck supple. No neck rigidity.     Vascular: No carotid bruit.  Cardiovascular:     Rate and Rhythm: Normal rate and regular rhythm.     Pulses: Normal pulses.     Heart sounds: Normal heart sounds. No murmur. No friction rub. No gallop.   Pulmonary:     Effort: Pulmonary effort is normal. No respiratory distress.     Breath sounds: Normal breath sounds. No stridor. No wheezing, rhonchi or rales.   Chest:     Chest wall: No tenderness.  Abdominal:     General: Bowel sounds are normal. There is no distension.     Palpations: Abdomen is soft. There is no mass.     Tenderness: There is no abdominal tenderness. There is no right CVA tenderness, left CVA tenderness, guarding or rebound.     Hernia: No hernia is present.  Musculoskeletal:     Right lower leg: No edema.     Left lower leg: No edema.  Lymphadenopathy:     Cervical: No cervical adenopathy.  Skin:    General: Skin is warm.     Coloration: Skin is not jaundiced or pale.     Findings: No bruising, erythema, lesion or rash.  Neurological:     General: No focal deficit present.     Mental Status: He is alert and oriented to person, place, and time.     Cranial Nerves: No cranial nerve deficit.     Sensory: No sensory deficit.     Motor: No weakness.     Coordination: Coordination normal.     Gait: Gait normal.     Deep Tendon Reflexes: Reflexes normal.  Psychiatric:        Mood and Affect: Mood normal.        Behavior: Behavior normal.        Thought Content: Thought content normal.        Judgment: Judgment normal.           Assessment & Plan:  1. Personal history of prostate cancer  2. Pure hypercholesterolemia  3. General medical exam  Cholesterol has worsened.  I recommended starting Crestor 5 mg a day and changing diet and rechecking a CMP and a fasting lipid panel in 3 months.  Immunizations are up-to-date except for the shingles vaccine, Shingrix.  I have recommended this vaccine.  Colonoscopy is up-to-date.  PSA is undetectable.  Patient denies any issues with falls or depression or memory loss.  I will asked the patient come back in free of charge and I will examine the area around his rectum which we forgot to examine during his physical today.  I apologize for the inconvenience.

## 2019-05-05 ENCOUNTER — Other Ambulatory Visit: Payer: Self-pay

## 2019-05-06 ENCOUNTER — Ambulatory Visit (INDEPENDENT_AMBULATORY_CARE_PROVIDER_SITE_OTHER): Payer: Medicare Other | Admitting: Family Medicine

## 2019-05-06 DIAGNOSIS — K6289 Other specified diseases of anus and rectum: Secondary | ICD-10-CM

## 2019-05-06 NOTE — Progress Notes (Signed)
Subjective:    Patient ID: Dwayne Young, male    DOB: Feb 23, 1951, 68 y.o.   MRN: [id number]  HPI  Please see the patient's physical on Tuesday.  Patient returned today at no charge for examination of the lesion around his anus.  On examination there is a 5 mm yellow cystic-like mass just adjacent to the anus.  This is tender to palpation.  He states that with palpation the pain radiates into his perineum below his scrotum.  The pain is a dull aching pain similar to a pulled muscle however it is triggered by palpation of the yellow cystlike mass.  This is been present now for approximately 1 year.  However it is starting to become painful. Past Medical History:  Diagnosis Date  . Arthritis    arthritis-generalized-knees, elbows  . Elevated PSA   . Hypercholesteremia   . Hyperlipidemia   . Prostate cancer ([place])    dx. prostate cancer after bx. 2'15-now surgery planned  . Retinal detachment   . Seasonal allergies    Past Surgical History:  Procedure Laterality Date  . COLONOSCOPY N/A 04/27/2013   Procedure: COLONOSCOPY;  Surgeon: Jamesetta So, MD;  [facility];  Service: Gastroenterology;  Laterality: N/A;  . GANGLION CYST EXCISION Right   . KNEE ARTHROSCOPY Bilateral    open procedure  . LYMPHADENECTOMY Bilateral 03/03/2014   Procedure: LYMPHADENECTOMY;  Surgeon: Raynelle Bring, MD;  Location: [facility];  Service: Urology;  Laterality: Bilateral;  . NASAL SEPTOPLASTY W/ TURBINOPLASTY    . ROBOT ASSISTED LAPAROSCOPIC RADICAL PROSTATECTOMY N/A 03/03/2014   Procedure: ROBOTIC ASSISTED LAPAROSCOPIC RADICAL PROSTATECTOMY LEVEL 2;  Surgeon: Raynelle Bring, MD;  Location: [facility];  Service: Urology;  Laterality: N/A;  . TONSILLECTOMY     child  . TOOTH EXTRACTION     preparing for tooth implant   Current Outpatient Medications on File Prior to Visit  Medication Sig Dispense Refill  . diclofenac sodium (VOLTAREN) 1 % GEL Apply 2 g topically 4 (four) times daily. 100 g 4  .  LORazepam (ATIVAN) 1 MG tablet TAKE (1) TABLET BY MOUTH TWICE DAILY AS NEEDED FOR ANXIETY. 20 tablet 0  . rosuvastatin (CRESTOR) 5 MG tablet Take 1 tablet (5 mg total) by mouth daily. 90 tablet 3   No current facility-administered medications on file prior to visit.    Allergies  Allergen Reactions  . Demerol [Meperidine] Nausea And Vomiting   Social History   Socioeconomic History  . Marital status: Married    Spouse name: Not on file  . Number of children: Not on file  . Years of education: Not on file  . Highest education level: Not on file  Occupational History  . Not on file  Social Needs  . Financial resource strain: Not on file  . Food insecurity    Worry: Not on file    Inability: Not on file  . Transportation needs    Medical: Not on file    Non-medical: Not on file  Tobacco Use  . Smoking status: Former Smoker    Packs/day: 0.50    Years: 15.00    Pack years: 7.50    Types: Cigarettes    Quit date: 04/27/1989    Years since quitting: 30.0  . Smokeless tobacco: Never Used  Substance and Sexual Activity  . Alcohol use: Yes    Alcohol/week: 1.0 standard drinks    Types: 1 Glasses of wine per week    Comment: moderate - wine  every 3 rd day  . Drug use: No  . Sexual activity: Yes    Birth control/protection: None  Lifestyle  . Physical activity    Days per week: Not on file    Minutes per session: Not on file  . Stress: Not on file  Relationships  . Social Herbalist on phone: Not on file    Gets together: Not on file    Attends religious service: Not on file    Active member of club or organization: Not on file    Attends meetings of clubs or organizations: Not on file    Relationship status: Not on file  . Intimate partner violence    Fear of current or ex partner: Not on file    Emotionally abused: Not on file    Physically abused: Not on file    Forced sexual activity: Not on file  Other Topics Concern  . Not on file  Social History  Narrative  . Not on file     Review of Systems  All other systems reviewed and are negative.      Objective:   Physical Exam Vitals signs reviewed.  Constitutional:      Appearance: Normal appearance. He is normal weight.  Cardiovascular:     Rate and Rhythm: Normal rate and regular rhythm.  Pulmonary:     Effort: Pulmonary effort is normal.     Breath sounds: Normal breath sounds.  Genitourinary:   Neurological:     Mental Status: He is alert.           Assessment & Plan:  1. Mass of perirectal soft tissue I am not certain this mass is.  It appears to be a cystlike mass.  However it is becoming more painful and causing him discomfort.  Therefore I will consult general surgery.  Patient would like to see Dr. Aviva Signs who is previously seen in the past.  I will arrange a consultation to see if they feel that they can surgically excise the mass and help with his pain

## 2019-06-01 ENCOUNTER — Ambulatory Visit: Payer: Medicare Other | Admitting: General Surgery

## 2019-06-01 ENCOUNTER — Encounter: Payer: Self-pay | Admitting: General Surgery

## 2019-06-01 ENCOUNTER — Other Ambulatory Visit: Payer: Self-pay

## 2019-06-01 VITALS — BP 151/91 | HR 65 | Temp 98.4°F | Resp 16 | Ht 75.0 in | Wt 203.0 lb

## 2019-06-01 DIAGNOSIS — K603 Anal fistula: Secondary | ICD-10-CM

## 2019-06-01 NOTE — Patient Instructions (Signed)
Anal Fistula  An anal fistula is a hole that develops between the bowel and the skin near the anus. The anus allows stool (feces) to leave the body. The anus has many tiny glands that make lubricating fluid. Sometimes, these glands become plugged and infected. This can cause a fluid-filled pocket (abscess) to form. An anal fistula often occurs when an abscess becomes infected and then develops into a hole between the bowel and the skin. What are the causes? In most cases, an anal fistula is caused by a past or current buildup of pus around the anus (anal abscess). Other causes include:  A complication of surgery.  Injury to the rectum or the area around it.  Using high-energy beams (radiation) to treat the area around the rectum. What increases the risk? You are more likely to develop this condition if you have certain medical conditions or diseases, including:  Chronic inflammatory bowel disease, such as Crohn's disease or ulcerative colitis.  Colon cancer or rectal cancer.  Diverticular disease, such as diverticulitis.  A sexually transmitted infection, or STI, such as gonorrhea, chlamydia, or syphilis.  An infection that is caused by HIV. What are the signs or symptoms? Symptoms of this condition include:  Throbbing or constant pain that may be worse while you are sitting.  Swelling or irritation around the anus.  Pus or blood from an opening near the anus.  Pain when passing stool.  Fever or chills. How is this diagnosed? This condition is diagnosed based on:  A physical exam. This may include: ? An exam to find the external opening of the fistula. ? An exam with a probe or scope to help locate the internal opening of the fistula. ? An exam of the rectum with a gloved hand (digital rectal exam).  Imaging tests that use dye to find the exact location and path of the fistula. Tests may include: ? X-rays. ? Ultrasound. ? CT scan. ? MRI.  Other tests to find the cause  of the anal fistula. How is this treated? This condition is most commonly treated with surgery. The type of surgery that is used will depend on where the fistula is located and how complex the fistula is. Surgery may include:  A fistulotomy. The whole fistula is opened up, and the contents are drained to promote healing.  Seton placement. A silk string (seton) is placed into the fistula during a fistulotomy. This helps to drain any infection and promote healing.  Advancement flap procedure. Tissue is removed from your rectum or the skin around the anus and attached to the opening of the fistula.  Bioprosthetic plug. A cone-shaped plug is made from your tissue and is used to block the opening of the fistula. Some anal fistulas do not require surgery. A nonsurgical treatment option involves injecting a fibrin glue to seal the fistula. You also may be prescribed an antibiotic medicine to treat any infection. Follow these instructions at home: Medicines  Take over-the-counter and prescription medicines only as told by your health care provider.  If you were prescribed an antibiotic medicine, take it as told by your health care provider. Do not stop taking the antibiotic even if you start to feel better.  Use a stool softener or a laxative if told to do so by your health care provider. General instructions   Eat a high-fiber diet as told by your health care provider. This can help to prevent constipation.  Drink enough fluid to keep your urine pale yellow.    Take a warm sitz bath for 15-20 minutes, 3-4 times per day, or as told by your health care provider. Sitz baths can ease your pain and discomfort and help with healing.  Follow good hygiene to keep the anal area as clean and dry as possible. Use wet toilet paper or a moist towelette after each bowel movement.  Keep all follow-up visits as told by your health care provider. This is important. Contact a health care provider if you have:   Increased pain that is not controlled with medicines.  New redness or swelling around the anal area.  New fluid, blood, or pus coming from the anal area.  Tenderness or warmth around the anal area. Get help right away if you have:  A fever.  Severe pain.  Chills or diarrhea.  Severe problems urinating or having a bowel movement. Summary  An anal fistula is a hole that develops between the bowel and the skin near the anus.  This condition is most often caused by a buildup of pus around the anus (anal abscess). Other causes include a complication of surgery, an injury to the rectum, or the use of radiation to treat the rectal area.  This condition is most commonly treated with surgery.  Follow your health care provider's instructions about taking medicines, eating and drinking, or taking sitz baths.  Call your health care provider if you have more pain, swelling, or blood. Get help right away if you have fever, severe pain, or problems passing urine or stool. This information is not intended to replace advice given to you by your health care provider. Make sure you discuss any questions you have with your health care provider. Document Released: 09/12/2008 Document Revised: 02/13/2018 Document Reviewed: 02/13/2018 Elsevier Patient Education  2020 [employer].

## 2019-06-01 NOTE — Progress Notes (Signed)
Dwayne Young; [id number]; 18-Mar-1951   HPI Patient is a 68 year old white male who was referred to my care by Dr. Dennard Schaumann for evaluation treatment of a perianal cyst.  Patient states is been present since his prostatectomy that was done in May of this year.  He denies any drainage from the cyst.  He has never been diagnosed with an anal fistula or perirectal abscess.  I last did a colonoscopy on him in 2014.  He denies any rectal pain with bowel movements.  He does have some tenderness when the cyst is swollen, but he has noticed that the cyst has decreased in size.  He has 0 out of 10 pain.  He denies any incontinence of stool. Past Medical History:  Diagnosis Date  . Arthritis    arthritis-generalized-knees, elbows  . Elevated PSA   . Hypercholesteremia   . Hyperlipidemia   . Prostate cancer ([place])    dx. prostate cancer after bx. 2'15-now surgery planned  . Retinal detachment   . Seasonal allergies     Past Surgical History:  Procedure Laterality Date  . COLONOSCOPY N/A 04/27/2013   Procedure: COLONOSCOPY;  Surgeon: Jamesetta So, MD;  [facility];  Service: Gastroenterology;  Laterality: N/A;  . GANGLION CYST EXCISION Right   . KNEE ARTHROSCOPY Bilateral    open procedure  . LYMPHADENECTOMY Bilateral 03/03/2014   Procedure: LYMPHADENECTOMY;  Surgeon: Raynelle Bring, MD;  Location: [facility];  Service: Urology;  Laterality: Bilateral;  . NASAL SEPTOPLASTY W/ TURBINOPLASTY    . ROBOT ASSISTED LAPAROSCOPIC RADICAL PROSTATECTOMY N/A 03/03/2014   Procedure: ROBOTIC ASSISTED LAPAROSCOPIC RADICAL PROSTATECTOMY LEVEL 2;  Surgeon: Raynelle Bring, MD;  Location: [facility];  Service: Urology;  Laterality: N/A;  . TONSILLECTOMY     child  . TOOTH EXTRACTION     preparing for tooth implant    History reviewed. No pertinent family history.  Current Outpatient Medications on File Prior to Visit  Medication Sig Dispense Refill  . rosuvastatin (CRESTOR) 5 MG tablet Take 1 tablet (5 mg  total) by mouth daily. 90 tablet 3  . diclofenac sodium (VOLTAREN) 1 % GEL Apply 2 g topically 4 (four) times daily. (Patient not taking: Reported on 06/01/2019) 100 g 4  . LORazepam (ATIVAN) 1 MG tablet TAKE (1) TABLET BY MOUTH TWICE DAILY AS NEEDED FOR ANXIETY. (Patient not taking: Reported on 06/01/2019) 20 tablet 0   No current facility-administered medications on file prior to visit.     No Active Allergies  Social History   Substance and Sexual Activity  Alcohol Use Yes  . Alcohol/week: 1.0 standard drinks  . Types: 1 Glasses of wine per week   Comment: moderate - wine every 3 rd day    Social History   Tobacco Use  Smoking Status Former Smoker  . Packs/day: 0.50  . Years: 15.00  . Pack years: 7.50  . Types: Cigarettes  . Quit date: 04/27/1989  . Years since quitting: 30.1  Smokeless Tobacco Never Used    Review of Systems  Constitutional: Negative.   HENT: Negative.   Eyes: Negative.   Respiratory: Negative.   Cardiovascular: Negative.   Gastrointestinal: Negative.   Genitourinary: Negative.   Musculoskeletal: Positive for back pain and neck pain.  Skin: Negative.   Neurological: Negative.   Endo/Heme/Allergies: Negative.   Psychiatric/Behavioral: Negative.     Objective   Vitals:   06/01/19 1102  BP: (!) 151/91  Pulse: 65  Resp: 16  Temp: 98.4 F (36.9 C)  SpO2: 95%    Physical Exam Vitals signs reviewed.  Constitutional:      Appearance: Normal appearance. He is not ill-appearing.  HENT:     Head: Normocephalic and atraumatic.  Cardiovascular:     Rate and Rhythm: Normal rate and regular rhythm.     Heart sounds: Normal heart sounds. No murmur. No friction rub. No gallop.   Pulmonary:     Effort: Pulmonary effort is normal. No respiratory distress.     Breath sounds: Normal breath sounds. No stridor. No wheezing, rhonchi or rales.  Genitourinary:    Comments: Small 5 mm cystic lesion along the left lateral aspect of the anus outside the  anal verge.  No significant induration is noted behind it.  No fluctuance is present.  Rectal examination was otherwise unremarkable. Skin:    General: Skin is warm and dry.  Neurological:     Mental Status: He is alert and oriented to person, place, and time.     Gait: Gait normal.    Primary care notes reviewed Assessment  Resolving perianal nodule secondary to probable anal fistula.  This does not seem active at the present time. Plan   As the nodule seems to be resolving, no need for surgical intervention at this time.  Patient understands this and agrees.  Should it become more of an issue, he was instructed to return to my office for further management treatment.

## 2019-06-30 ENCOUNTER — Other Ambulatory Visit: Payer: Self-pay

## 2019-06-30 ENCOUNTER — Ambulatory Visit (INDEPENDENT_AMBULATORY_CARE_PROVIDER_SITE_OTHER): Payer: Medicare Other | Admitting: Family Medicine

## 2019-06-30 DIAGNOSIS — Z23 Encounter for immunization: Secondary | ICD-10-CM | POA: Diagnosis not present

## 2019-11-13 ENCOUNTER — Ambulatory Visit: Payer: Medicare Other

## 2019-11-17 ENCOUNTER — Other Ambulatory Visit: Payer: Self-pay | Admitting: Family Medicine

## 2019-11-17 NOTE — Telephone Encounter (Signed)
Requesting refill  Klonopin  LOV: 05/06/2019  LRF: 03/12/19

## 2019-11-19 ENCOUNTER — Ambulatory Visit: Payer: Medicare PPO | Attending: Internal Medicine

## 2019-11-19 DIAGNOSIS — Z23 Encounter for immunization: Secondary | ICD-10-CM | POA: Insufficient documentation

## 2019-11-19 NOTE — Progress Notes (Signed)
   Covid-19 Vaccination Clinic  Name:  Dwayne Young    MRN: [id number] DOB: 06-04-1951  11/19/2019  [name] was observed post Covid-19 immunization for 15 minutes without incidence. He was provided with Vaccine Information Sheet and instruction to access the V-Safe system.   [name] was instructed to call 911 with any severe reactions post vaccine: Marland Kitchen Difficulty breathing  . Swelling of your face and throat  . A fast heartbeat  . A bad rash all over your body  . Dizziness and weakness    Immunizations Administered    Name Date Dose VIS Date Route   Pfizer COVID-19 Vaccine 11/19/2019  5:01 PM 0.3 mL 09/24/2019 Intramuscular   Manufacturer: [place]   Lot: [id number]   [place]: [id number]

## 2019-11-24 ENCOUNTER — Ambulatory Visit: Payer: Medicare Other

## 2019-12-14 ENCOUNTER — Ambulatory Visit: Payer: Medicare PPO | Attending: Internal Medicine

## 2019-12-14 DIAGNOSIS — Z23 Encounter for immunization: Secondary | ICD-10-CM

## 2019-12-14 NOTE — Progress Notes (Signed)
   Covid-19 Vaccination Clinic  Name:  TIRTH MASK    MRN: [id number] DOB: January 20, 1951  12/14/2019  Mr. O[place]ltree was observed post Covid-19 immunization for 15 minutes without incident. He was provided with Vaccine Information Sheet and instruction to access the V-Safe system.   Mr. Roebke was instructed to call 911 with any severe reactions post vaccine: Marland Kitchen Difficulty breathing  . Swelling of face and throat  . A fast heartbeat  . A bad rash all over body  . Dizziness and weakness   Immunizations Administered    Name Date Dose VIS Date Route   Pfizer COVID-19 Vaccine 12/14/2019  4:14 PM 0.3 mL 09/24/2019 Intramuscular   Manufacturer: [place]   Lot: [id number]   [place]: [id number]

## 2019-12-20 ENCOUNTER — Other Ambulatory Visit: Payer: Self-pay

## 2019-12-20 ENCOUNTER — Other Ambulatory Visit: Payer: Medicare PPO

## 2019-12-20 DIAGNOSIS — Z8546 Personal history of malignant neoplasm of prostate: Secondary | ICD-10-CM | POA: Diagnosis not present

## 2019-12-20 DIAGNOSIS — E7800 Pure hypercholesterolemia, unspecified: Secondary | ICD-10-CM | POA: Diagnosis not present

## 2019-12-20 DIAGNOSIS — Z0000 Encounter for general adult medical examination without abnormal findings: Secondary | ICD-10-CM

## 2019-12-20 DIAGNOSIS — N289 Disorder of kidney and ureter, unspecified: Secondary | ICD-10-CM

## 2019-12-21 LAB — COMPLETE METABOLIC PANEL WITH GFR
AG Ratio: 1.6 (calc) (ref 1.0–2.5)
ALT: 12 U/L (ref 9–46)
AST: 17 U/L (ref 10–35)
Albumin: 4.1 g/dL (ref 3.6–5.1)
Alkaline phosphatase (APISO): 67 U/L (ref 35–144)
BUN: 18 mg/dL (ref 7–25)
CO2: 27 mmol/L (ref 20–32)
Calcium: 9.2 mg/dL (ref 8.6–10.3)
Chloride: 105 mmol/L (ref 98–110)
Creat: 1.12 mg/dL (ref 0.70–1.25)
GFR, Est African American: 77 mL/min/{1.73_m2} (ref >=60)
GFR, Est Non African American: 67 mL/min/{1.73_m2} (ref >=60)
Globulin: 2.5 g/dL (calc) (ref 1.9–3.7)
Glucose, Bld: 88 mg/dL (ref 65–99)
Potassium: 4.5 mmol/L (ref 3.5–5.3)
Sodium: 140 mmol/L (ref 135–146)
Total Bilirubin: 0.6 mg/dL (ref 0.2–1.2)
Total Protein: 6.6 g/dL (ref 6.1–8.1)

## 2019-12-21 LAB — CBC WITH DIFFERENTIAL/PLATELET
Absolute Monocytes: 555 cells/uL (ref 200–950)
Basophils Absolute: 18 cells/uL (ref 0–200)
Basophils Relative: 0.3 %
Eosinophils Absolute: 183 cells/uL (ref 15–500)
Eosinophils Relative: 3.1 %
HCT: 47.4 % (ref 38.5–50.0)
Hemoglobin: 15.5 g/dL (ref 13.2–17.1)
Lymphs Abs: 1847 cells/uL (ref 850–3900)
MCH: 30.5 pg (ref 27.0–33.0)
MCHC: 32.7 g/dL (ref 32.0–36.0)
MCV: 93.3 fL (ref 80.0–100.0)
MPV: 10.3 fL (ref 7.5–12.5)
Monocytes Relative: 9.4 %
Neutro Abs: 3298 cells/uL (ref 1500–7800)
Neutrophils Relative %: 55.9 %
Platelets: 246 10*3/uL (ref 140–400)
RBC: 5.08 10*6/uL (ref 4.20–5.80)
RDW: 12.8 % (ref 11.0–15.0)
Total Lymphocyte: 31.3 %
WBC: 5.9 10*3/uL (ref 3.8–10.8)

## 2019-12-21 LAB — LIPID PANEL
Cholesterol: 146 mg/dL (ref <200)
HDL: 53 mg/dL (ref >=40)
LDL Cholesterol (Calc): 77 mg/dL (calc)
Non-HDL Cholesterol (Calc): 93 mg/dL (calc) (ref <130)
Total CHOL/HDL Ratio: 2.8 (calc) (ref <5.0)
Triglycerides: 79 mg/dL (ref <150)

## 2019-12-21 LAB — PSA: PSA: <0.1 ng/mL (ref <=4.0)

## 2019-12-23 ENCOUNTER — Encounter: Payer: Self-pay | Admitting: Family Medicine

## 2019-12-23 ENCOUNTER — Ambulatory Visit: Payer: Medicare PPO | Admitting: Family Medicine

## 2019-12-23 ENCOUNTER — Other Ambulatory Visit: Payer: Self-pay

## 2019-12-23 ENCOUNTER — Other Ambulatory Visit: Payer: Self-pay | Admitting: Family Medicine

## 2019-12-23 VITALS — BP 140/72 | HR 64 | Temp 98.8°F | Resp 14 | Ht 75.0 in | Wt 201.0 lb

## 2019-12-23 DIAGNOSIS — Z8546 Personal history of malignant neoplasm of prostate: Secondary | ICD-10-CM

## 2019-12-23 DIAGNOSIS — E7800 Pure hypercholesterolemia, unspecified: Secondary | ICD-10-CM | POA: Diagnosis not present

## 2019-12-23 MED ORDER — LEVOCETIRIZINE DIHYDROCHLORIDE 5 MG PO TABS: 5.0000 mg | ORAL_TABLET | Freq: Every evening | ORAL | 5 refills | Status: DC

## 2019-12-23 MED ORDER — LORAZEPAM 1 MG PO TABS: ORAL_TABLET | ORAL | 0 refills | Status: DC

## 2019-12-23 NOTE — Progress Notes (Signed)
Subjective:    Patient ID: Dwayne Young, male    DOB: 01/05/1951, 69 y.o.   MRN: [id number]  HPI  Patient is a very pleasant 69 year old Caucasian male with a history of prostate cancer.  His most recent PSA was undetectable.  He denies any chest pain shortness of breath or dyspnea on exertion.  His blood pressure today is borderline at 140/72 however he monitors this at home and finds that his blood pressure has been well controlled at home.  He is taking Crestor 5 mg a day and his most recent lab work is outstanding.  Most recent lab work is listed below: Lab on 12/20/2019  Component Date Value Ref Range Status  . PSA 12/20/2019 <0.1  < OR = 4.0 ng/mL Final   Comment: The total PSA value from this assay system is  standardized against the WHO standard. The test  result will be approximately 20% lower when compared  to the equimolar-standardized total PSA (Beckman  Coulter). Comparison of serial PSA results should be  interpreted with this fact in mind. . This test was performed using the Siemens  chemiluminescent method. Values obtained from  different assay methods cannot be used interchangeably. PSA levels, regardless of value, should not be interpreted as absolute evidence of the presence or absence of disease.   . Glucose, Bld 12/20/2019 88  65 - 99 mg/dL Final   Comment: .            Fasting reference interval .   . BUN 12/20/2019 18  7 - 25 mg/dL Final  . Creat 12/20/2019 1.12  0.70 - 1.25 mg/dL Final   Comment: For patients >10 years of age, the reference limit for Creatinine is approximately 13% higher for people identified as African-American. .   . GFR, Est Non African American 12/20/2019 67  > OR = 60 mL/min/1.52m2 Final  . GFR, Est African American 12/20/2019 77  > OR = 60 mL/min/1.38m2 Final  . BUN/Creatinine Ratio 123456 NOT APPLICABLE  6 - 22 (calc) Final  . Sodium 12/20/2019 140  135 - 146 mmol/L Final  . Potassium 12/20/2019 4.5  3.5 - 5.3 mmol/L  Final  . Chloride 12/20/2019 105  98 - 110 mmol/L Final  . CO2 12/20/2019 27  20 - 32 mmol/L Final  . Calcium 12/20/2019 9.2  8.6 - 10.3 mg/dL Final  . Total Protein 12/20/2019 6.6  6.1 - 8.1 g/dL Final  . Albumin 12/20/2019 4.1  3.6 - 5.1 g/dL Final  . Globulin 12/20/2019 2.5  1.9 - 3.7 g/dL (calc) Final  . AG Ratio 12/20/2019 1.6  1.0 - 2.5 (calc) Final  . Total Bilirubin 12/20/2019 0.6  0.2 - 1.2 mg/dL Final  . Alkaline phosphatase (APISO) 12/20/2019 67  35 - 144 U/L Final  . AST 12/20/2019 17  10 - 35 U/L Final  . ALT 12/20/2019 12  9 - 46 U/L Final  . WBC 12/20/2019 5.9  3.8 - 10.8 Thousand/uL Final  . RBC 12/20/2019 5.08  4.20 - 5.80 Million/uL Final  . Hemoglobin 12/20/2019 15.5  13.2 - 17.1 g/dL Final  . HCT 12/20/2019 47.4  38.5 - 50.0 % Final  . MCV 12/20/2019 93.3  80.0 - 100.0 fL Final  . MCH 12/20/2019 30.5  27.0 - 33.0 pg Final  . MCHC 12/20/2019 32.7  32.0 - 36.0 g/dL Final  . RDW 12/20/2019 12.8  11.0 - 15.0 % Final  . Platelets 12/20/2019 246  140 - 400 Thousand/uL Final  .  MPV 12/20/2019 10.3  7.5 - 12.5 fL Final  . Neutro Abs 12/20/2019 3,298  1,500 - 7,800 cells/uL Final  . Lymphs Abs 12/20/2019 1,847  850 - 3,900 cells/uL Final  . Absolute Monocytes 12/20/2019 555  200 - 950 cells/uL Final  . Eosinophils Absolute 12/20/2019 183  15 - 500 cells/uL Final  . Basophils Absolute 12/20/2019 18  0 - 200 cells/uL Final  . Neutrophils Relative % 12/20/2019 55.9  % Final  . Total Lymphocyte 12/20/2019 31.3  % Final  . Monocytes Relative 12/20/2019 9.4  % Final  . Eosinophils Relative 12/20/2019 3.1  % Final  . Basophils Relative 12/20/2019 0.3  % Final  . Cholesterol 12/20/2019 146  <200 mg/dL Final  . HDL 12/20/2019 53  > OR = 40 mg/dL Final  . Triglycerides 12/20/2019 79  <150 mg/dL Final  . LDL Cholesterol (Calc) 12/20/2019 77  mg/dL (calc) Final   Comment: Reference range: <100 . Desirable range <100 mg/dL for primary prevention;   <70 mg/dL for patients with CHD  or diabetic patients  with > or = 2 CHD risk factors. Marland Kitchen LDL-C is now calculated using the Martin-Hopkins  calculation, which is a validated novel method providing  better accuracy than the Friedewald equation in the  estimation of LDL-C.  Cresenciano Genre et al. Annamaria Helling. [id number]): 2061-2068  (http://education.[facility].com/faq/FAQ164)   . Total CHOL/HDL Ratio 12/20/2019 2.8  <5.0 (calc) Final  . Non-HDL Cholesterol (Calc) 12/20/2019 93  <130 mg/dL (calc) Final   Comment: For patients with diabetes plus 1 major ASCVD risk  factor, treating to a non-HDL-C goal of <100 mg/dL  (LDL-C of <70 mg/dL) is considered a therapeutic  option.     Immunization History  Administered Date(s) Administered  . Fluad Quad(high Dose 65+) 06/30/2019  . Hepatitis A 03/28/1999, 04/29/2011, 10/24/2011  . IPV 04/03/1999  . Influenza, High Dose Seasonal PF 07/28/2018  . Influenza-Unspecified 08/13/2015, 07/14/2017  . PFIZER SARS-COV-2 Vaccination 11/19/2019, 12/14/2019  . Pneumococcal Conjugate-13 04/04/2016  . Pneumococcal Polysaccharide-23 04/20/2015  . Tdap 04/29/2011  . Typhoid Inactivated 04/03/1999, 05/01/2011  . Yellow Fever 04/03/1999  . Zoster 03/15/2013   Past Surgical History:  Procedure Laterality Date  . COLONOSCOPY N/A 04/27/2013   Procedure: COLONOSCOPY;  Surgeon: Jamesetta So, MD;  [facility];  Service: Gastroenterology;  Laterality: N/A;  . GANGLION CYST EXCISION Right   . KNEE ARTHROSCOPY Bilateral    open procedure  . LYMPHADENECTOMY Bilateral 03/03/2014   Procedure: LYMPHADENECTOMY;  Surgeon: Raynelle Bring, MD;  Location: [facility];  Service: Urology;  Laterality: Bilateral;  . NASAL SEPTOPLASTY W/ TURBINOPLASTY    . ROBOT ASSISTED LAPAROSCOPIC RADICAL PROSTATECTOMY N/A 03/03/2014   Procedure: ROBOTIC ASSISTED LAPAROSCOPIC RADICAL PROSTATECTOMY LEVEL 2;  Surgeon: Raynelle Bring, MD;  Location: [facility];  Service: Urology;  Laterality: N/A;  . TONSILLECTOMY     child  .  TOOTH EXTRACTION     preparing for tooth implant   Past Medical History:  Diagnosis Date  . Arthritis    arthritis-generalized-knees, elbows  . Elevated PSA   . Hypercholesteremia   . Hyperlipidemia   . Prostate cancer ([place])    dx. prostate cancer after bx. 2'15-now surgery planned  . Retinal detachment   . Seasonal allergies    Current Outpatient Medications on File Prior to Visit  Medication Sig Dispense Refill  . diclofenac sodium (VOLTAREN) 1 % GEL Apply 2 g topically 4 (four) times daily. (Patient not taking: Reported on 06/01/2019) 100 g 4  .  LORazepam (ATIVAN) 1 MG tablet TAKE (1) TABLET BY MOUTH TWICE DAILY AS NEEDED FOR ANXIETY. 20 tablet 0  . rosuvastatin (CRESTOR) 5 MG tablet Take 1 tablet (5 mg total) by mouth daily. 90 tablet 3   No current facility-administered medications on file prior to visit.   No Active Allergies Social History   Socioeconomic History  . Marital status: Married    Spouse name: Not on file  . Number of children: Not on file  . Years of education: Not on file  . Highest education level: Not on file  Occupational History  . Not on file  Tobacco Use  . Smoking status: Former Smoker    Packs/day: 0.50    Years: 15.00    Pack years: 7.50    Types: Cigarettes    Quit date: 04/27/1989    Years since quitting: 30.6  . Smokeless tobacco: Never Used  Substance and Sexual Activity  . Alcohol use: Yes    Alcohol/week: 1.0 standard drinks    Types: 1 Glasses of wine per week    Comment: moderate - wine every 3 rd day  . Drug use: No  . Sexual activity: Yes    Birth control/protection: None  Other Topics Concern  . Not on file  Social History Narrative  . Not on file   Social Determinants of Health   Financial Resource Strain:   . Difficulty of Paying Living Expenses:   Food Insecurity:   . Worried About Charity fundraiser in the Last Year:   . Arboriculturist in the Last Year:   Transportation Needs:   . Film/video editor  (Medical):   Marland Kitchen Lack of Transportation (Non-Medical):   Physical Activity:   . Days of Exercise per Week:   . Minutes of Exercise per Session:   Stress:   . Feeling of Stress :   Social Connections:   . Frequency of Communication with Friends and Family:   . Frequency of Social Gatherings with Friends and Family:   . Attends Religious Services:   . Active Member of Clubs or Organizations:   . Attends Archivist Meetings:   Marland Kitchen Marital Status:   Intimate Partner Violence:   . Fear of Current or Ex-Partner:   . Emotionally Abused:   Marland Kitchen Physically Abused:   . Sexually Abused:       Review of Systems  All other systems reviewed and are negative.      Objective:   Physical Exam Vitals reviewed.  Constitutional:      General: He is not in acute distress.    Appearance: Normal appearance. He is normal weight. He is not ill-appearing, toxic-appearing or diaphoretic.  HENT:     Head: Normocephalic and atraumatic.     Right Ear: Tympanic membrane, ear canal and external ear normal. There is no impacted cerumen.     Left Ear: Tympanic membrane, ear canal and external ear normal. There is no impacted cerumen.     Nose: Nose normal. No congestion or rhinorrhea.     Mouth/Throat:     Mouth: Mucous membranes are moist.     Pharynx: Oropharynx is clear. No oropharyngeal exudate or posterior oropharyngeal erythema.  Eyes:     General: No scleral icterus.       Right eye: No discharge.        Left eye: No discharge.     Extraocular Movements: Extraocular movements intact.     Conjunctiva/sclera: Conjunctivae normal.  Pupils: Pupils are equal, round, and reactive to light.  Neck:     Vascular: No carotid bruit.  Cardiovascular:     Rate and Rhythm: Normal rate and regular rhythm.     Pulses: Normal pulses.     Heart sounds: Normal heart sounds. No murmur. No friction rub. No gallop.   Pulmonary:     Effort: Pulmonary effort is normal. No respiratory distress.     Breath  sounds: Normal breath sounds. No stridor. No wheezing, rhonchi or rales.  Chest:     Chest wall: No tenderness.  Abdominal:     General: Bowel sounds are normal. There is no distension.     Palpations: Abdomen is soft. There is no mass.     Tenderness: There is no abdominal tenderness. There is no right CVA tenderness, left CVA tenderness, guarding or rebound.     Hernia: No hernia is present.  Musculoskeletal:     Cervical back: Normal range of motion and neck supple. No rigidity.     Right lower leg: No edema.     Left lower leg: No edema.  Lymphadenopathy:     Cervical: No cervical adenopathy.  Skin:    General: Skin is warm.     Coloration: Skin is not jaundiced or pale.     Findings: No bruising, erythema, lesion or rash.  Neurological:     General: No focal deficit present.     Mental Status: He is alert and oriented to person, place, and time.     Cranial Nerves: No cranial nerve deficit.     Sensory: No sensory deficit.     Motor: No weakness.     Coordination: Coordination normal.     Gait: Gait normal.     Deep Tendon Reflexes: Reflexes normal.  Psychiatric:        Mood and Affect: Mood normal.        Behavior: Behavior normal.        Thought Content: Thought content normal.        Judgment: Judgment normal.           Assessment & Plan:  Pure hypercholesterolemia  Personal history of prostate cancer  I am extremely proud of this patient with his cholesterol.  Continue Crestor 5 mg a day and reassess in 6 months.  PSA remains undetectable.  Blood pressure is borderline here but he is monitoring it closely at home.  Make no changes at the present time.

## 2020-01-13 DIAGNOSIS — M549 Dorsalgia, unspecified: Secondary | ICD-10-CM

## 2020-02-02 DIAGNOSIS — L821 Other seborrheic keratosis: Secondary | ICD-10-CM | POA: Diagnosis not present

## 2020-02-02 DIAGNOSIS — D225 Melanocytic nevi of trunk: Secondary | ICD-10-CM | POA: Diagnosis not present

## 2020-02-02 DIAGNOSIS — B070 Plantar wart: Secondary | ICD-10-CM | POA: Diagnosis not present

## 2020-02-15 DIAGNOSIS — M25561 Pain in right knee: Secondary | ICD-10-CM | POA: Insufficient documentation

## 2020-03-09 DIAGNOSIS — H33322 Round hole, left eye: Secondary | ICD-10-CM | POA: Diagnosis not present

## 2020-03-10 ENCOUNTER — Ambulatory Visit: Payer: Medicare PPO | Attending: Internal Medicine

## 2020-03-10 DIAGNOSIS — Z20822 Contact with and (suspected) exposure to covid-19: Secondary | ICD-10-CM

## 2020-03-11 LAB — NOVEL CORONAVIRUS, NAA: SARS-CoV-2, NAA: NOT DETECTED

## 2020-03-11 LAB — SARS-COV-2, NAA 2 DAY TAT

## 2020-04-06 ENCOUNTER — Ambulatory Visit (INDEPENDENT_AMBULATORY_CARE_PROVIDER_SITE_OTHER): Payer: Medicare PPO | Admitting: Family Medicine

## 2020-04-06 ENCOUNTER — Other Ambulatory Visit: Payer: Self-pay

## 2020-04-06 VITALS — BP 130/80 | HR 70 | Temp 98.4°F | Ht 75.0 in | Wt 201.0 lb

## 2020-04-06 DIAGNOSIS — M25561 Pain in right knee: Secondary | ICD-10-CM | POA: Diagnosis not present

## 2020-04-06 DIAGNOSIS — L821 Other seborrheic keratosis: Secondary | ICD-10-CM | POA: Diagnosis not present

## 2020-04-06 NOTE — Progress Notes (Signed)
Subjective:    Patient ID: Dwayne Young, male    DOB: 05/01/1951, 69 y.o.   MRN: [id number]  HPI  Patient is a very pleasant 70 year old Caucasian male here today for 2 concerns.  #1 he reports gradually worsening pain in his right knee.  The pain is located over the medial compartment and is also posterior.  It hurts worse with flexion.  It hurts worse with walking.  He denies any laxity to varus or valgus stress.  He denies any weakness in the knee.  The knee is not giving way on him.  There is no erythema or effusion.  He does have some tenderness to palpation over the posterior medial joint line with extremes of flexion.  However Apley grind does not elicit any pain.  Patient has a history of a meniscal tear and underwent arthroscopic surgery many years ago in that area.  He is active and exercising and walking daily.  I suspect osteoarthritis however he cannot tolerate NSAIDs due to chronic kidney disease.  He also has 2 lesions that he is concerned about.  There is one lesion on the upper left side of his back just medial to the scapula.  This is a 5 mm erythematous irritated papule that appears to be an irritated seborrheic keratosis.  He would like this treated with liquid nitrogen cryotherapy.  He has a similar wartlike lesion over the lateral aspect of his left heel inferior to the lateral malleolus.  I suspect that this is a wart  Past Surgical History:  Procedure Laterality Date  . COLONOSCOPY N/A 04/27/2013   Procedure: COLONOSCOPY;  Surgeon: Jamesetta So, MD;  [facility];  Service: Gastroenterology;  Laterality: N/A;  . GANGLION CYST EXCISION Right   . KNEE ARTHROSCOPY Bilateral    open procedure  . LYMPHADENECTOMY Bilateral 03/03/2014   Procedure: LYMPHADENECTOMY;  Surgeon: Raynelle Bring, MD;  Location[facility];  Service: Urology;  Laterality: Bilateral;  . NASAL SEPTOPLASTY W/ TURBINOPLASTY    . ROBOT ASSISTED LAPAROSCOPIC RADICAL PROSTATECTOMY N/A 03/03/2014    Procedure: ROBOTIC ASSISTED LAPAROSCOPIC RADICAL PROSTATECTOMY LEVEL 2;  Surgeon: Raynelle Bring, MD;  Location[facility];  Service: Urology;  Laterality: N/A;  . TONSILLECTOMY     child  . TOOTH EXTRACTION     preparing for tooth implant   Past Medical History:  Diagnosis Date  . Arthritis    arthritis-generalized-knees, elbows  . Elevated PSA   . Hypercholesteremia   . Hyperlipidemia   . Prostate cancer ([place])    dx. prostate cancer after bx. 2'15-now surgery planned  . Retinal detachment   . Seasonal allergies    Current Outpatient Medications on File Prior to Visit  Medication Sig Dispense Refill  . levocetirizine (XYZAL) 5 MG tablet Take 1 tablet (5 mg total) by mouth every evening. 30 tablet 5  . LORazepam (ATIVAN) 1 MG tablet TAKE (1) TABLET BY MOUTH TWICE DAILY AS NEEDED FOR ANXIETY. 20 tablet 0  . rosuvastatin (CRESTOR) 5 MG tablet Take 1 tablet (5 mg total) by mouth daily. 90 tablet 3   No current facility-administered medications on file prior to visit.   No Active Allergies Social History   Socioeconomic History  . Marital status: Married    Spouse name: Not on file  . Number of children: Not on file  . Years of education: Not on file  . Highest education level: Not on file  Occupational History  . Not on file  Tobacco Use  .  Smoking status: Former Smoker    Packs/day: 0.50    Years: 15.00    Pack years: 7.50    Types: Cigarettes    Quit date: 04/27/1989    Years since quitting: 30.9  . Smokeless tobacco: Never Used  Substance and Sexual Activity  . Alcohol use: Yes    Alcohol/week: 1.0 standard drink    Types: 1 Glasses of wine per week    Comment: moderate - wine every 3 rd day  . Drug use: No  . Sexual activity: Yes    Birth control/protection: None  Other Topics Concern  . Not on file  Social History Narrative  . Not on file   Social Determinants of Health   Financial Resource Strain:   . Difficulty of Paying Living Expenses:   Food  Insecurity:   . Worried About Charity fundraiser in the Last Year:   . Arboriculturist in the Last Year:   Transportation Needs:   . Film/video editor (Medical):   Marland Kitchen Lack of Transportation (Non-Medical):   Physical Activity:   . Days of Exercise per Week:   . Minutes of Exercise per Session:   Stress:   . Feeling of Stress :   Social Connections:   . Frequency of Communication with Friends and Family:   . Frequency of Social Gatherings with Friends and Family:   . Attends Religious Services:   . Active Member of Clubs or Organizations:   . Attends Archivist Meetings:   Marland Kitchen Marital Status:   Intimate Partner Violence:   . Fear of Current or Ex-Partner:   . Emotionally Abused:   Marland Kitchen Physically Abused:   . Sexually Abused:       Review of Systems  All other systems reviewed and are negative.      Objective:   Physical Exam Vitals reviewed.  Constitutional:      General: He is not in acute distress.    Appearance: Normal appearance. He is normal weight. He is not ill-appearing, toxic-appearing or diaphoretic.  Cardiovascular:     Rate and Rhythm: Normal rate and regular rhythm.     Pulses: Normal pulses.     Heart sounds: Normal heart sounds. No murmur heard.  No friction rub. No gallop.   Pulmonary:     Effort: Pulmonary effort is normal. No respiratory distress.     Breath sounds: Normal breath sounds. No stridor. No wheezing, rhonchi or rales.  Chest:     Chest wall: No tenderness.  Musculoskeletal:     Cervical back: Normal range of motion and neck supple.     Right knee: No swelling, deformity or effusion. Decreased range of motion. Tenderness present over the medial joint line. No MCL, LCL, ACL or PCL tenderness.  Skin:    General: Skin is warm.     Coloration: Skin is not jaundiced.     Findings: Lesion present. No bruising.       Neurological:     General: No focal deficit present.     Mental Status: He is alert and oriented to person,  place, and time.           Assessment & Plan:  Seborrheic keratoses  Acute pain of right knee  I suspect osteoarthritis in the right knee in the medial compartment.  Patient cannot tolerate NSAIDs I will offer the patient a cortisone injection.  He elects to try activity modification and topical NSAIDs first and then recheck in 1  month if no better.  Would proceed with imaging at that point if no better.  Each of the 2 lesions described above were treated with liquid nitrogen cryotherapy for 30 seconds.

## 2020-05-01 ENCOUNTER — Other Ambulatory Visit: Payer: Self-pay | Admitting: Family Medicine

## 2020-05-23 DIAGNOSIS — H25043 Posterior subcapsular polar age-related cataract, bilateral: Secondary | ICD-10-CM | POA: Diagnosis not present

## 2020-05-23 DIAGNOSIS — H25013 Cortical age-related cataract, bilateral: Secondary | ICD-10-CM | POA: Diagnosis not present

## 2020-05-23 DIAGNOSIS — H2513 Age-related nuclear cataract, bilateral: Secondary | ICD-10-CM | POA: Diagnosis not present

## 2020-05-23 DIAGNOSIS — H18413 Arcus senilis, bilateral: Secondary | ICD-10-CM | POA: Diagnosis not present

## 2020-05-23 DIAGNOSIS — H35373 Puckering of macula, bilateral: Secondary | ICD-10-CM | POA: Diagnosis not present

## 2020-05-23 DIAGNOSIS — H2511 Age-related nuclear cataract, right eye: Secondary | ICD-10-CM | POA: Diagnosis not present

## 2020-06-30 ENCOUNTER — Other Ambulatory Visit: Payer: Self-pay

## 2020-06-30 ENCOUNTER — Other Ambulatory Visit: Payer: Medicare PPO

## 2020-06-30 DIAGNOSIS — Z8546 Personal history of malignant neoplasm of prostate: Secondary | ICD-10-CM | POA: Diagnosis not present

## 2020-06-30 DIAGNOSIS — E7800 Pure hypercholesterolemia, unspecified: Secondary | ICD-10-CM

## 2020-06-30 DIAGNOSIS — Z0000 Encounter for general adult medical examination without abnormal findings: Secondary | ICD-10-CM | POA: Diagnosis not present

## 2020-07-01 LAB — CBC WITH DIFFERENTIAL/PLATELET
Absolute Monocytes: 452 cells/uL (ref 200–950)
Basophils Absolute: 21 cells/uL (ref 0–200)
Basophils Relative: 0.4 %
Eosinophils Absolute: 140 cells/uL (ref 15–500)
Eosinophils Relative: 2.7 %
HCT: 46.1 % (ref 38.5–50.0)
Hemoglobin: 15.2 g/dL (ref 13.2–17.1)
Lymphs Abs: 1758 cells/uL (ref 850–3900)
MCH: 31 pg (ref 27.0–33.0)
MCHC: 33 g/dL (ref 32.0–36.0)
MCV: 94.1 fL (ref 80.0–100.0)
MPV: 10.3 fL (ref 7.5–12.5)
Monocytes Relative: 8.7 %
Neutro Abs: 2829 cells/uL (ref 1500–7800)
Neutrophils Relative %: 54.4 %
Platelets: 226 10*3/uL (ref 140–400)
RBC: 4.9 10*6/uL (ref 4.20–5.80)
RDW: 12.6 % (ref 11.0–15.0)
Total Lymphocyte: 33.8 %
WBC: 5.2 10*3/uL (ref 3.8–10.8)

## 2020-07-01 LAB — COMPLETE METABOLIC PANEL WITH GFR
AG Ratio: 1.7 (calc) (ref 1.0–2.5)
ALT: 12 U/L (ref 9–46)
AST: 16 U/L (ref 10–35)
Albumin: 4 g/dL (ref 3.6–5.1)
Alkaline phosphatase (APISO): 64 U/L (ref 35–144)
BUN/Creatinine Ratio: 16 (calc) (ref 6–22)
BUN: 20 mg/dL (ref 7–25)
CO2: 26 mmol/L (ref 20–32)
Calcium: 8.9 mg/dL (ref 8.6–10.3)
Chloride: 107 mmol/L (ref 98–110)
Creat: 1.25 mg/dL — ABNORMAL HIGH (ref 0.70–1.18)
GFR, Est African American: 67 mL/min/{1.73_m2} (ref >=60)
GFR, Est Non African American: 58 mL/min/{1.73_m2} — ABNORMAL LOW (ref >=60)
Globulin: 2.3 g/dL (calc) (ref 1.9–3.7)
Glucose, Bld: 92 mg/dL (ref 65–99)
Potassium: 4.6 mmol/L (ref 3.5–5.3)
Sodium: 140 mmol/L (ref 135–146)
Total Bilirubin: 0.5 mg/dL (ref 0.2–1.2)
Total Protein: 6.3 g/dL (ref 6.1–8.1)

## 2020-07-01 LAB — LIPID PANEL
Cholesterol: 143 mg/dL (ref <200)
HDL: 50 mg/dL (ref >=40)
LDL Cholesterol (Calc): 74 mg/dL (calc)
Non-HDL Cholesterol (Calc): 93 mg/dL (calc) (ref <130)
Total CHOL/HDL Ratio: 2.9 (calc) (ref <5.0)
Triglycerides: 105 mg/dL (ref <150)

## 2020-07-01 LAB — PSA: PSA: <0.04 ng/mL (ref <=4.0)

## 2020-07-11 ENCOUNTER — Other Ambulatory Visit: Payer: Self-pay

## 2020-07-11 ENCOUNTER — Ambulatory Visit (INDEPENDENT_AMBULATORY_CARE_PROVIDER_SITE_OTHER): Payer: Medicare PPO | Admitting: Family Medicine

## 2020-07-11 VITALS — BP 130/80 | HR 75 | Temp 98.2°F | Ht 75.0 in | Wt 200.0 lb

## 2020-07-11 DIAGNOSIS — M25561 Pain in right knee: Secondary | ICD-10-CM | POA: Diagnosis not present

## 2020-07-11 DIAGNOSIS — Z8546 Personal history of malignant neoplasm of prostate: Secondary | ICD-10-CM

## 2020-07-11 DIAGNOSIS — N1831 Chronic kidney disease, stage 3a: Secondary | ICD-10-CM | POA: Diagnosis not present

## 2020-07-11 DIAGNOSIS — M545 Low back pain, unspecified: Secondary | ICD-10-CM

## 2020-07-11 DIAGNOSIS — Z0000 Encounter for general adult medical examination without abnormal findings: Secondary | ICD-10-CM

## 2020-07-11 DIAGNOSIS — Z0001 Encounter for general adult medical examination with abnormal findings: Secondary | ICD-10-CM | POA: Diagnosis not present

## 2020-07-11 DIAGNOSIS — E7800 Pure hypercholesterolemia, unspecified: Secondary | ICD-10-CM

## 2020-07-11 MED ORDER — CYCLOBENZAPRINE HCL 10 MG PO TABS: 10.0000 mg | ORAL_TABLET | Freq: Three times a day (TID) | ORAL | 0 refills | Status: DC | PRN

## 2020-07-11 MED ORDER — LORAZEPAM 1 MG PO TABS
ORAL_TABLET | ORAL | 0 refills | Status: DC
Start: 2020-07-11 — End: 2021-03-02

## 2020-07-11 NOTE — Progress Notes (Signed)
Subjective:    Patient ID: Dwayne Young, male    DOB: 09-Feb-1951, 69 y.o.   MRN: [id number]  HPI Patient is here today for complete physical exam.  His last colonoscopy was performed in 2014 and was reportedly normal.  He is not due again for a colonoscopy until 2024.  He does report pain in his right lower back.  He also has pain in his right knee.  He denies any specific injury.  He reports stiffness with sitting.  It hurts when he stands up from a seated position.  He denies any neuropathy radiating down his legs.  He also complains of pain with flexion and extension and walking in his right knee.  He would like to try cortisone shot in his right knee as he is unable to take NSAIDs due to chronic kidney disease.  Most recent lab work is listed below: Lab on 06/30/2020  Component Date Value Ref Range Status  . Cholesterol 06/30/2020 143  <200 mg/dL Final  . HDL 06/30/2020 50  > OR = 40 mg/dL Final  . Triglycerides 06/30/2020 105  <150 mg/dL Final  . LDL Cholesterol (Calc) 06/30/2020 74  mg/dL (calc) Final   Comment: Reference range: <100 . Desirable range <100 mg/dL for primary prevention;   <70 mg/dL for patients with CHD or diabetic patients  with > or = 2 CHD risk factors. Marland Kitchen LDL-C is now calculated using the Martin-Hopkins  calculation, which is a validated novel method providing  better accuracy than the Friedewald equation in the  estimation of LDL-C.  Cresenciano Genre et al. Annamaria Helling. 9937;169(67): 2061-2068  (http://education.[facility].com/faq/FAQ164)   . Total CHOL/HDL Ratio 06/30/2020 2.9  <5.0 (calc) Final  . Non-HDL Cholesterol (Calc) 06/30/2020 93  <130 mg/dL (calc) Final   Comment: For patients with diabetes plus 1 major ASCVD risk  factor, treating to a non-HDL-C goal of <100 mg/dL  (LDL-C of <70 mg/dL) is considered a therapeutic  option.   . Glucose, Bld 06/30/2020 92  65 - 99 mg/dL Final   Comment: .            Fasting reference interval .   . BUN 06/30/2020 20   7 - 25 mg/dL Final  . Creat 06/30/2020 1.25* 0.70 - 1.18 mg/dL Final   Comment: For patients >109 years of age, the reference limit for Creatinine is approximately 13% higher for people identified as African-American. .   . GFR, Est Non African American 06/30/2020 58* > OR = 60 mL/min/1.50m2 Final  . GFR, Est African American 06/30/2020 67  > OR = 60 mL/min/1.27m2 Final  . BUN/Creatinine Ratio 06/30/2020 16  6 - 22 (calc) Final  . Sodium 06/30/2020 140  135 - 146 mmol/L Final  . Potassium 06/30/2020 4.6  3.5 - 5.3 mmol/L Final  . Chloride 06/30/2020 107  98 - 110 mmol/L Final  . CO2 06/30/2020 26  20 - 32 mmol/L Final  . Calcium 06/30/2020 8.9  8.6 - 10.3 mg/dL Final  . Total Protein 06/30/2020 6.3  6.1 - 8.1 g/dL Final  . Albumin 06/30/2020 4.0  3.6 - 5.1 g/dL Final  . Globulin 06/30/2020 2.3  1.9 - 3.7 g/dL (calc) Final  . AG Ratio 06/30/2020 1.7  1.0 - 2.5 (calc) Final  . Total Bilirubin 06/30/2020 0.5  0.2 - 1.2 mg/dL Final  . Alkaline phosphatase (APISO) 06/30/2020 64  35 - 144 U/L Final  . AST 06/30/2020 16  10 - 35 U/L Final  . ALT 06/30/2020  12  9 - 46 U/L Final  . WBC 06/30/2020 5.2  3.8 - 10.8 Thousand/uL Final  . RBC 06/30/2020 4.90  4.20 - 5.80 Million/uL Final  . Hemoglobin 06/30/2020 15.2  13.2 - 17.1 g/dL Final  . HCT 06/30/2020 46.1  38 - 50 % Final  . MCV 06/30/2020 94.1  80.0 - 100.0 fL Final  . MCH 06/30/2020 31.0  27.0 - 33.0 pg Final  . MCHC 06/30/2020 33.0  32.0 - 36.0 g/dL Final  . RDW 06/30/2020 12.6  11.0 - 15.0 % Final  . Platelets 06/30/2020 226  140 - 400 Thousand/uL Final  . MPV 06/30/2020 10.3  7.5 - 12.5 fL Final  . Neutro Abs 06/30/2020 2,829  1,500 - 7,800 cells/uL Final  . Lymphs Abs 06/30/2020 1,758  850 - 3,900 cells/uL Final  . Absolute Monocytes 06/30/2020 452  200 - 950 cells/uL Final  . Eosinophils Absolute 06/30/2020 140  15 - 500 cells/uL Final  . Basophils Absolute 06/30/2020 21  0 - 200 cells/uL Final  . Neutrophils Relative %  06/30/2020 54.4  % Final  . Total Lymphocyte 06/30/2020 33.8  % Final  . Monocytes Relative 06/30/2020 8.7  % Final  . Eosinophils Relative 06/30/2020 2.7  % Final  . Basophils Relative 06/30/2020 0.4  % Final  . PSA 06/30/2020 <0.04  < OR = 4.0 ng/mL Final   Comment: The total PSA value from this assay system is  standardized against the WHO standard. The test  result will be approximately 20% lower when compared  to the equimolar-standardized total PSA (Beckman  Coulter). Comparison of serial PSA results should be  interpreted with this fact in mind. . This test was performed using the Siemens  chemiluminescent method. Values obtained from  different assay methods cannot be used interchangeably. PSA levels, regardless of value, should not be interpreted as absolute evidence of the presence or absence of disease.     Immunization History  Administered Date(s) Administered  . Fluad Quad(high Dose 65+) 06/30/2019  . Hepatitis A 03/28/1999, 04/29/2011, 10/24/2011  . IPV 04/03/1999  . Influenza, High Dose Seasonal PF 07/28/2018  . Influenza-Unspecified 08/13/2015, 07/14/2017  . PFIZER SARS-COV-2 Vaccination 11/19/2019, 12/14/2019  . Pneumococcal Conjugate-13 04/04/2016  . Pneumococcal Polysaccharide-23 04/20/2015  . Tdap 04/29/2011  . Typhoid Inactivated 04/03/1999, 05/01/2011  . Yellow Fever 04/03/1999  . Zoster 03/15/2013   Past Surgical History:  Procedure Laterality Date  . COLONOSCOPY N/A 04/27/2013   Procedure: COLONOSCOPY;  Surgeon: Jamesetta So, MD;  Location: [facility];  Service: Gastroenterology;  Laterality: N/A;  . GANGLION CYST EXCISION Right   . KNEE ARTHROSCOPY Bilateral    open procedure  . LYMPHADENECTOMY Bilateral 03/03/2014   Procedure: LYMPHADENECTOMY;  Surgeon: Raynelle Bring, MD;  Location: [facility];  Service: Urology;  Laterality: Bilateral;  . NASAL SEPTOPLASTY W/ TURBINOPLASTY    . PROSTATE SURGERY N/A    [employer]reesia 07/08/2020  . ROBOT ASSISTED  LAPAROSCOPIC RADICAL PROSTATECTOMY N/A 03/03/2014   Procedure: ROBOTIC ASSISTED LAPAROSCOPIC RADICAL PROSTATECTOMY LEVEL 2;  Surgeon: Raynelle Bring, MD;  Location: [facility];  Service: Urology;  Laterality: N/A;  . TONSILLECTOMY     child  . TOOTH EXTRACTION     preparing for tooth implant  . VASECTOMY N/A    [employer]reesia 07/08/2020   Past Medical History:  Diagnosis Date  . Arthritis    arthritis-generalized-knees, elbows  . Elevated PSA   . Hypercholesteremia   . Hyperlipidemia   . Prostate cancer ([place])  dx. prostate cancer after bx. 2'15-now surgery planned  . Retinal detachment   . Seasonal allergies    Current Outpatient Medications on File Prior to Visit  Medication Sig Dispense Refill  . rosuvastatin (CRESTOR) 5 MG tablet TAKE ONE TABLET BY MOUTH ONCE DAILY. 90 tablet 0  . levocetirizine (XYZAL) 5 MG tablet Take 1 tablet (5 mg total) by mouth every evening. 30 tablet 5   No current facility-administered medications on file prior to visit.   No Active Allergies Social History   Socioeconomic History  . Marital status: Married    Spouse name: Not on file  . Number of children: Not on file  . Years of education: Not on file  . Highest education level: Not on file  Occupational History  . Not on file  Tobacco Use  . Smoking status: Former Smoker    Packs/day: 0.50    Years: 15.00    Pack years: 7.50    Types: Cigarettes    Quit date: 04/27/1989    Years since quitting: 31.2  . Smokeless tobacco: Never Used  Substance and Sexual Activity  . Alcohol use: Yes    Alcohol/week: 1.0 standard drink    Types: 1 Glasses of wine per week    Comment: moderate - wine every 3 rd day  . Drug use: No  . Sexual activity: Yes    Birth control/protection: None  Other Topics Concern  . Not on file  Social History Narrative  . Not on file   Social Determinants of Health   Financial Resource Strain:   . Difficulty of Paying Living Expenses: Not on file  Food Insecurity:   .  Worried About Charity fundraiser in the Last Year: Not on file  . Ran Out of Food in the Last Year: Not on file  Transportation Needs:   . Lack of Transportation (Medical): Not on file  . Lack of Transportation (Non-Medical): Not on file  Physical Activity:   . Days of Exercise per Week: Not on file  . Minutes of Exercise per Session: Not on file  Stress:   . Feeling of Stress : Not on file  Social Connections:   . Frequency of Communication with Friends and Family: Not on file  . Frequency of Social Gatherings with Friends and Family: Not on file  . Attends Religious Services: Not on file  . Active Member of Clubs or Organizations: Not on file  . Attends Archivist Meetings: Not on file  . Marital Status: Not on file  Intimate Partner Violence:   . Fear of Current or Ex-Partner: Not on file  . Emotionally Abused: Not on file  . Physically Abused: Not on file  . Sexually Abused: Not on file      Review of Systems  All other systems reviewed and are negative.      Objective:   Physical Exam Vitals reviewed.  Constitutional:      General: He is not in acute distress.    Appearance: Normal appearance. He is normal weight. He is not ill-appearing, toxic-appearing or diaphoretic.  HENT:     Head: Normocephalic and atraumatic.     Right Ear: Tympanic membrane, ear canal and external ear normal. There is no impacted cerumen.     Left Ear: Tympanic membrane, ear canal and external ear normal. There is no impacted cerumen.     Nose: Nose normal. No congestion or rhinorrhea.     Mouth/Throat:     Mouth:  Mucous membranes are moist.     Pharynx: Oropharynx is clear. No oropharyngeal exudate or posterior oropharyngeal erythema.  Eyes:     General: No scleral icterus.       Right eye: No discharge.        Left eye: No discharge.     Extraocular Movements: Extraocular movements intact.     Conjunctiva/sclera: Conjunctivae normal.     Pupils: Pupils are equal, round, and  reactive to light.  Neck:     Vascular: No carotid bruit.  Cardiovascular:     Rate and Rhythm: Normal rate and regular rhythm.     Pulses: Normal pulses.     Heart sounds: Normal heart sounds. No murmur heard.  No friction rub. No gallop.   Pulmonary:     Effort: Pulmonary effort is normal. No respiratory distress.     Breath sounds: Normal breath sounds. No stridor. No wheezing, rhonchi or rales.  Chest:     Chest wall: No tenderness.  Abdominal:     General: Bowel sounds are normal. There is no distension.     Palpations: Abdomen is soft. There is no mass.     Tenderness: There is no abdominal tenderness. There is no right CVA tenderness, left CVA tenderness, guarding or rebound.     Hernia: No hernia is present.  Musculoskeletal:     Cervical back: Normal range of motion and neck supple. No rigidity.     Right lower leg: No edema.     Left lower leg: No edema.  Lymphadenopathy:     Cervical: No cervical adenopathy.  Skin:    General: Skin is warm.     Coloration: Skin is not jaundiced or pale.     Findings: No bruising, erythema, lesion or rash.  Neurological:     General: No focal deficit present.     Mental Status: He is alert and oriented to person, place, and time.     Cranial Nerves: No cranial nerve deficit.     Sensory: No sensory deficit.     Motor: No weakness.     Coordination: Coordination normal.     Gait: Gait normal.     Deep Tendon Reflexes: Reflexes normal.  Psychiatric:        Mood and Affect: Mood normal.        Behavior: Behavior normal.        Thought Content: Thought content normal.        Judgment: Judgment normal.           Assessment & Plan:  Acute midline low back pain without sciatica - Plan: DG Lumbar Spine Complete  Stage 3a chronic kidney disease - Plan: Microalbumin/Creatinine Ratio, Urine  Acute pain of right knee  Pure hypercholesterolemia  Personal history of prostate cancer  General medical exam  I believe the pain in  his lower back is likely muscular.  Begin by obtaining an x-ray particular given his history of prostate cancer.  If the x-ray is normal, I would recommend physical therapy.  I believe the pain in his right knee is osteoarthritis.  Using sterile technique, I injected the right knee with 2 cc lidocaine, 2 cc of Marcaine, and 2 cc of 40 mg/mL Kenalog.  Patient tolerated the procedure well without complication.  Given his history of chronic kidney disease I will check a urine albumin to creatinine ratio.  If greater than 30 I would recommend starting the patient on a low-dose ACE inhibitor.  The remainder of his  lab work is excellent.  PSA remains undetectable.  Colonoscopy is up-to-date.  We reviewed his immunizations.  Recommended a booster on his Covid vaccine and also recommended a flu shot.  Patient denies any falls, depression, or memory loss

## 2020-07-13 ENCOUNTER — Ambulatory Visit: Payer: Medicare PPO

## 2020-07-17 ENCOUNTER — Other Ambulatory Visit: Payer: Self-pay

## 2020-07-17 ENCOUNTER — Ambulatory Visit (INDEPENDENT_AMBULATORY_CARE_PROVIDER_SITE_OTHER): Payer: Medicare PPO | Admitting: Family Medicine

## 2020-07-17 ENCOUNTER — Other Ambulatory Visit: Payer: Medicare PPO

## 2020-07-17 ENCOUNTER — Ambulatory Visit
Admission: RE | Admit: 2020-07-17 | Discharge: 2020-07-17 | Disposition: A | Discharge status: Home / Self Care | Payer: Medicare PPO | Source: Ambulatory Visit | Attending: Family Medicine | Admitting: Family Medicine

## 2020-07-17 DIAGNOSIS — N1831 Chronic kidney disease, stage 3a: Secondary | ICD-10-CM | POA: Diagnosis not present

## 2020-07-17 DIAGNOSIS — Z23 Encounter for immunization: Secondary | ICD-10-CM

## 2020-07-17 DIAGNOSIS — M545 Low back pain, unspecified: Secondary | ICD-10-CM

## 2020-07-17 DIAGNOSIS — M47816 Spondylosis without myelopathy or radiculopathy, lumbar region: Secondary | ICD-10-CM | POA: Diagnosis not present

## 2020-07-18 LAB — MICROALBUMIN / CREATININE URINE RATIO
Creatinine, Urine: 122 mg/dL (ref 20–320)
Microalb Creat Ratio: 3 mcg/mg creat (ref <30)
Microalb, Ur: 0.4 mg/dL

## 2020-07-20 ENCOUNTER — Ambulatory Visit: Payer: Medicare PPO | Attending: Internal Medicine

## 2020-07-20 DIAGNOSIS — Z23 Encounter for immunization: Secondary | ICD-10-CM

## 2020-07-20 NOTE — Progress Notes (Signed)
   Covid-19 Vaccination Clinic  Name:  Dwayne Young    MRN: [id number] DOB: [birth year]  07/20/2020  Mr. [name] was observed post Covid-19 immunization for 15 minutes without incident. He was provided with Vaccine Information Sheet and instruction to access the V-Safe system.   Mr. [name] was instructed to call 911 with any severe reactions post vaccine: Marland Kitchen Difficulty breathing  . Swelling of face and throat  . A fast heartbeat  . A bad rash all over body  . Dizziness and weakness

## 2020-08-03 ENCOUNTER — Ambulatory Visit: Payer: Medicare PPO

## 2020-08-07 DIAGNOSIS — H2511 Age-related nuclear cataract, right eye: Secondary | ICD-10-CM | POA: Diagnosis not present

## 2020-08-08 DIAGNOSIS — H2512 Age-related nuclear cataract, left eye: Secondary | ICD-10-CM | POA: Diagnosis not present

## 2020-08-09 ENCOUNTER — Other Ambulatory Visit: Payer: Self-pay | Admitting: Family Medicine

## 2020-08-14 ENCOUNTER — Ambulatory Visit (INDEPENDENT_AMBULATORY_CARE_PROVIDER_SITE_OTHER): Payer: Medicare PPO | Admitting: Family Medicine

## 2020-08-14 ENCOUNTER — Other Ambulatory Visit: Payer: Self-pay

## 2020-08-14 VITALS — BP 140/60 | HR 87 | Temp 97.3°F | Ht 75.0 in | Wt 201.0 lb

## 2020-08-14 DIAGNOSIS — R509 Fever, unspecified: Secondary | ICD-10-CM

## 2020-08-14 NOTE — Progress Notes (Signed)
Subjective:    Patient ID: Dwayne Young, male    DOB: 06/23/1951, 69 y.o.   MRN: [id number]  HPI   Patient is a 69 year old Caucasian male who has had all 3 doses of his Covid vaccine.  He had cataract surgery 1 week ago.  This was on Monday.  On Tuesday he developed a diffuse pressure-like headache along with some neck stiffness.  The headache and neck stiffness that lasted approximately 3 days through Thursday.  They have since gone away.  However beginning Friday he has had a fever between 100-102 every afternoon.  He denies any cough.  He denies any chest pain.  He denies any shortness of breath.  He denies any pleurisy.  He denies any sinus pain or sore throat or runny nose.  He is not having any further headaches.  He denies any nausea or vomiting although he has been having watery diarrhea every morning.  The diarrhea occurs 1 or 2 times a day.  It is nonbloody.  He has not had any recent travel or antibiotic use.  He denies any rash or tick bites.  He denies any bleeding or bruising.  His physical exam today is completely normal Past Surgical History:  Procedure Laterality Date  . COLONOSCOPY N/A 04/27/2013   Procedure: COLONOSCOPY;  Surgeon: Jamesetta So, MD;  [facility];  Service: Gastroenterology;  Laterality: N/A;  . GANGLION CYST EXCISION Right   . KNEE ARTHROSCOPY Bilateral    open procedure  . LYMPHADENECTOMY Bilateral 03/03/2014   Procedure: LYMPHADENECTOMY;  Surgeon: Raynelle Bring, MD;  Location: [facility];  Service: Urology;  Laterality: Bilateral;  . NASAL SEPTOPLASTY W/ TURBINOPLASTY    . PROSTATE SURGERY N/A    [employer]reesia 07/08/2020  . ROBOT ASSISTED LAPAROSCOPIC RADICAL PROSTATECTOMY N/A 03/03/2014   Procedure: ROBOTIC ASSISTED LAPAROSCOPIC RADICAL PROSTATECTOMY LEVEL 2;  Surgeon: Raynelle Bring, MD;  Location: [facility];  Service: Urology;  Laterality: N/A;  . TONSILLECTOMY     child  . TOOTH EXTRACTION     preparing for tooth implant  . VASECTOMY N/A     [facility]reesia 07/08/2020   Past Medical History:  Diagnosis Date  . Arthritis    arthritis-generalized-knees, elbows  . Elevated PSA   . Hypercholesteremia   . Hyperlipidemia   . Prostate cancer ([place])    dx. prostate cancer after bx. 2'15-now surgery planned  . Retinal detachment   . Seasonal allergies    Current Outpatient Medications on File Prior to Visit  Medication Sig Dispense Refill  . cyclobenzaprine (FLEXERIL) 10 MG tablet Take 1 tablet (10 mg total) by mouth 3 (three) times daily as needed for muscle spasms. 30 tablet 0  . LORazepam (ATIVAN) 1 MG tablet TAKE (1) TABLET BY MOUTH TWICE DAILY AS NEEDED FOR ANXIETY. 20 tablet 0  . rosuvastatin (CRESTOR) 5 MG tablet TAKE ONE TABLET BY MOUTH ONCE DAILY. 90 tablet 2  . levocetirizine (XYZAL) 5 MG tablet Take 1 tablet (5 mg total) by mouth every evening. 30 tablet 5   No current facility-administered medications on file prior to visit.   No Active Allergies Social History   Socioeconomic History  . Marital status: Married    Spouse name: Not on file  . Number of children: Not on file  . Years of education: Not on file  . Highest education level: Not on file  Occupational History  . Not on file  Tobacco Use  . Smoking status: Former Smoker    Packs/day: 0.50  Years: 15.00    Pack years: 7.50    Types: Cigarettes    Quit date: 04/27/1989    Years since quitting: 31.3  . Smokeless tobacco: Never Used  Substance and Sexual Activity  . Alcohol use: Yes    Alcohol/week: 1.0 standard drink    Types: 1 Glasses of wine per week    Comment: moderate - wine every 3 rd day  . Drug use: No  . Sexual activity: Yes    Birth control/protection: None  Other Topics Concern  . Not on file  Social History Narrative  . Not on file   Social Determinants of Health   Financial Resource Strain:   . Difficulty of Paying Living Expenses: Not on file  Food Insecurity:   . Worried About Charity fundraiser in the Last Year: Not on file   . Ran Out of Food in the Last Year: Not on file  Transportation Needs:   . Lack of Transportation (Medical): Not on file  . Lack of Transportation (Non-Medical): Not on file  Physical Activity:   . Days of Exercise per Week: Not on file  . Minutes of Exercise per Session: Not on file  Stress:   . Feeling of Stress : Not on file  Social Connections:   . Frequency of Communication with Friends and Family: Not on file  . Frequency of Social Gatherings with Friends and Family: Not on file  . Attends Religious Services: Not on file  . Active Member of Clubs or Organizations: Not on file  . Attends Archivist Meetings: Not on file  . Marital Status: Not on file  Intimate Partner Violence:   . Fear of Current or Ex-Partner: Not on file  . Emotionally Abused: Not on file  . Physically Abused: Not on file  . Sexually Abused: Not on file      Review of Systems  Neurological: Positive for headaches.  All other systems reviewed and are negative.      Objective:   Physical Exam Vitals reviewed.  Constitutional:      General: He is not in acute distress.    Appearance: Normal appearance. He is normal weight. He is not ill-appearing, toxic-appearing or diaphoretic.  HENT:     Head: Normocephalic and atraumatic.     Right Ear: Tympanic membrane, ear canal and external ear normal. There is no impacted cerumen.     Left Ear: Tympanic membrane, ear canal and external ear normal. There is no impacted cerumen.     Nose: Nose normal. No congestion or rhinorrhea.     Mouth/Throat:     Mouth: Mucous membranes are moist.     Pharynx: Oropharynx is clear. No oropharyngeal exudate or posterior oropharyngeal erythema.  Eyes:     General: No scleral icterus.       Right eye: No discharge.        Left eye: No discharge.     Extraocular Movements: Extraocular movements intact.     Conjunctiva/sclera: Conjunctivae normal.     Pupils: Pupils are equal, round, and reactive to light.   Neck:     Vascular: No carotid bruit.  Cardiovascular:     Rate and Rhythm: Normal rate and regular rhythm.     Pulses: Normal pulses.     Heart sounds: Normal heart sounds. No murmur heard.  No friction rub. No gallop.   Pulmonary:     Effort: Pulmonary effort is normal. No respiratory distress.     Breath  sounds: Normal breath sounds. No stridor. No wheezing, rhonchi or rales.  Chest:     Chest wall: No tenderness.  Abdominal:     General: Bowel sounds are normal. There is no distension.     Palpations: Abdomen is soft. There is no mass.     Tenderness: There is no abdominal tenderness. There is no right CVA tenderness, left CVA tenderness, guarding or rebound.     Hernia: No hernia is present.  Musculoskeletal:     Cervical back: Normal range of motion and neck supple. No rigidity.     Right lower leg: No edema.     Left lower leg: No edema.  Lymphadenopathy:     Cervical: No cervical adenopathy.  Skin:    General: Skin is warm.     Coloration: Skin is not jaundiced or pale.     Findings: No bruising, erythema, lesion or rash.  Neurological:     General: No focal deficit present.     Mental Status: He is alert and oriented to person, place, and time.     Cranial Nerves: No cranial nerve deficit.     Sensory: No sensory deficit.     Motor: No weakness.     Coordination: Coordination normal.     Gait: Gait normal.     Deep Tendon Reflexes: Reflexes normal.  Psychiatric:        Mood and Affect: Mood normal.        Behavior: Behavior normal.        Thought Content: Thought content normal.        Judgment: Judgment normal.           Assessment & Plan:  Fever, unspecified fever cause - Plan: CBC with Differential/Platelet, COMPLETE METABOLIC PANEL WITH GFR, Culture, blood (single) w Reflex to ID Panel, COVID19 and Influenza A & B, DG Chest 2 View  Physical exam is normal.  Given the myriad of symptoms, I believe he has a viral syndrome and symptoms should gradually  improve over 7 to 10 days total.  However I would work-up fever of unknown origin by checking the patient for COVID-19, flu, chest x-ray, CBC, CMP.  I would also obtain a blood culture as he did have an IV during his cataract surgery just to rule out bacteremia from skin contamination

## 2020-08-15 LAB — SARS-COVID-2 RNA(COVID19)AND INFLUENZA A&B, QUALITATIVE NAAT
FLU A: NOT DETECTED
FLU B: NOT DETECTED
SARS CoV2 RNA: NOT DETECTED

## 2020-08-17 ENCOUNTER — Other Ambulatory Visit: Payer: Self-pay

## 2020-08-17 ENCOUNTER — Other Ambulatory Visit: Payer: Self-pay | Admitting: Family Medicine

## 2020-08-17 DIAGNOSIS — R509 Fever, unspecified: Secondary | ICD-10-CM

## 2020-08-17 DIAGNOSIS — Z9189 Other specified personal risk factors, not elsewhere classified: Secondary | ICD-10-CM

## 2020-08-17 MED ORDER — TIZANIDINE HCL 4 MG PO TABS: 4.0000 mg | ORAL_TABLET | Freq: Four times a day (QID) | ORAL | 0 refills | Status: DC | PRN

## 2020-08-17 NOTE — Progress Notes (Signed)
[id number]

## 2020-08-18 ENCOUNTER — Other Ambulatory Visit: Payer: Medicare PPO

## 2020-08-18 ENCOUNTER — Other Ambulatory Visit: Payer: Self-pay

## 2020-08-18 ENCOUNTER — Ambulatory Visit: Payer: Medicare PPO | Admitting: Family Medicine

## 2020-08-18 DIAGNOSIS — G44219 Episodic tension-type headache, not intractable: Secondary | ICD-10-CM

## 2020-08-18 DIAGNOSIS — R509 Fever, unspecified: Secondary | ICD-10-CM | POA: Diagnosis not present

## 2020-08-18 DIAGNOSIS — Z0189 Encounter for other specified special examinations: Secondary | ICD-10-CM | POA: Diagnosis not present

## 2020-08-18 DIAGNOSIS — A09 Infectious gastroenteritis and colitis, unspecified: Secondary | ICD-10-CM | POA: Diagnosis not present

## 2020-08-18 MED ORDER — CIPROFLOXACIN HCL 500 MG PO TABS: 500.0000 mg | ORAL_TABLET | Freq: Two times a day (BID) | ORAL | 0 refills | Status: DC

## 2020-08-18 MED ORDER — METRONIDAZOLE 500 MG PO TABS: 500.0000 mg | ORAL_TABLET | Freq: Three times a day (TID) | ORAL | 0 refills | Status: DC

## 2020-08-18 NOTE — Progress Notes (Signed)
Subjective:    Patient ID: Dwayne Young, male    DOB: 1951/05/25, 69 y.o.   MRN: [id number]  Headache     08/14/20 Patient is a 69 year old Caucasian male who has had all 3 doses of his Covid vaccine.  He had cataract surgery 1 week ago.  This was on Monday.  On Tuesday he developed a diffuse pressure-like headache along with some neck stiffness.  The headache and neck stiffness that lasted approximately 3 days through Thursday.  They have since gone away.  However beginning Friday he has had a fever between 100-102 every afternoon.  He denies any cough.  He denies any chest pain.  He denies any shortness of breath.  He denies any pleurisy.  He denies any sinus pain or sore throat or runny nose.  He is not having any further headaches.  He denies any nausea or vomiting although he has been having watery diarrhea every morning.  The diarrhea occurs 1 or 2 times a day.  It is nonbloody.  He has not had any recent travel or antibiotic use.  He denies any rash or tick bites.  He denies any bleeding or bruising.  His physical exam today is completely normal.  At that time, my plan was: Physical exam is normal.  Given the myriad of symptoms, I believe he has a viral syndrome and symptoms should gradually improve over 7 to 10 days total.  However I would work-up fever of unknown origin by checking the patient for COVID-19, flu, chest x-ray, CBC, CMP.  I would also obtain a blood culture as he did have an IV during his cataract surgery just to rule out bacteremia from skin contamination  Office Visit on 08/14/2020  Component Date Value Ref Range Status  . WBC 08/14/2020 6.3  3.8 - 10.8 Thousand/uL Final  . RBC 08/14/2020 5.06  4.20 - 5.80 Million/uL Final  . Hemoglobin 08/14/2020 15.1  13.2 - 17.1 g/dL Final  . HCT 08/14/2020 46.4  38 - 50 % Final  . MCV 08/14/2020 91.7  80.0 - 100.0 fL Final  . MCH 08/14/2020 29.8  27.0 - 33.0 pg Final  . MCHC 08/14/2020 32.5  32.0 - 36.0 g/dL Final  . RDW  08/14/2020 12.9  11.0 - 15.0 % Final  . Platelets 08/14/2020 172  140 - 400 Thousand/uL Final  . MPV 08/14/2020 10.3  7.5 - 12.5 fL Final  . Neutro Abs 08/14/2020 3,862  1,500 - 7,800 cells/uL Final  . Lymphs Abs 08/14/2020 1,613  850 - 3,900 cells/uL Final  . Absolute Monocytes 08/14/2020 599  200 - 950 cells/uL Final  . Eosinophils Absolute 08/14/2020 189  15.0 - 500.0 cells/uL Final  . Basophils Absolute 08/14/2020 38  0.0 - 200.0 cells/uL Final  . Neutrophils Relative % 08/14/2020 61.3  % Final  . Total Lymphocyte 08/14/2020 25.6  % Final  . Monocytes Relative 08/14/2020 9.5  % Final  . Eosinophils Relative 08/14/2020 3.0  % Final  . Basophils Relative 08/14/2020 0.6  % Final  . Glucose, Bld 08/14/2020 84  65 - 99 mg/dL Final   Comment: .            Fasting reference interval .   . BUN 08/14/2020 18  7 - 25 mg/dL Final  . Creat 08/14/2020 1.41* 0.70 - 1.18 mg/dL Final   Comment: For patients >49 years of age, the reference limit for Creatinine is approximately 13% higher for people identified as African-American. Marland Kitchen   Marland Kitchen  GFR, Est Non African American 08/14/2020 50* > OR = 60 mL/min/1.31m Final  . GFR, Est African American 08/14/2020 58* > OR = 60 mL/min/1.777mFinal  . BUN/Creatinine Ratio 08/14/2020 13  6 - 22 (calc) Final  . Sodium 08/14/2020 137  135 - 146 mmol/L Final  . Potassium 08/14/2020 4.0  3.5 - 5.3 mmol/L Final  . Chloride 08/14/2020 101  98 - 110 mmol/L Final  . CO2 08/14/2020 25  20 - 32 mmol/L Final  . Calcium 08/14/2020 8.7  8.6 - 10.3 mg/dL Final  . Total Protein 08/14/2020 6.7  6.1 - 8.1 g/dL Final  . Albumin 08/14/2020 4.0  3.6 - 5.1 g/dL Final  . Globulin 08/14/2020 2.7  1.9 - 3.7 g/dL (calc) Final  . AG Ratio 08/14/2020 1.5  1.0 - 2.5 (calc) Final  . Total Bilirubin 08/14/2020 0.7  0.2 - 1.2 mg/dL Final  . Alkaline phosphatase (APISO) 08/14/2020 91  35 - 144 U/L Final  . AST 08/14/2020 35  10 - 35 U/L Final  . ALT 08/14/2020 38  9 - 46 U/L Final  .  MICRO NUMBER: 08/14/2020 [id number] Preliminary  . SPECIMEN QUALITY: 08/14/2020 Suboptimal   Preliminary  . Source 08/14/2020 NOT GIVEN   Preliminary  . STATUS: 08/14/2020 PRELIMINARY   Preliminary  . Result: 08/14/2020    Preliminary                   Value:No growth to date. Culture is continuously monitored for a total of 120 hours incubation. A change in status will result in a phone report followed by an updated printed culture report. Inspection of blood culture bottles indicates that an inadequate  volume of blood may have been collected for the detection of sepsis.   . Marland KitchenOMMENT: 08/14/2020 Aerobic and anaerobic bottle received.   Preliminary  . FLU A 08/14/2020 NOT DETECTED  NOT DETECT Final  . FLU B 08/14/2020 NOT DETECTED  NOT DETECT Final  . SARS CoV2 RNA 08/14/2020 NOT DETECTED  NOT DETECT Final   Comment: . A Not Detected (negative) test result for this test means that SARS- CoV-2 RNA was not present in the specimen above the limit of detection. A negative result does not rule out the possibility of COVID-19 and should not be used as the sole basis for treatment or patient management  decisions.  If COVID-19 is still suspected, based on  exposure history together with other clinical findings, re-testing should be considered in consultation with public health authorities. Laboratory test results should always be considered in the context of clinical  observations and epidemiological data in making a final diagnosis and patient management decisions. . Please review the "Fact Sheets" and FDA authorized labeling available for health care providers and patients using the following websites: [email or web address]-[email or web address]-fact- [email or web address] . Please note: If a not detected (negativ                          e) Influenza A or Influenza B is obtained, this means viral RNA was not present  in the specimen above the limit of detection.  A negative result does not rule out the possibility of influenza and should not be used as the sole basis for treatment or patient management decisions.  If Influenza is still suspected, based on exposure history together with other clinical findings, re-testing should be considered. . This test has been authorized by the FDA under an  Emergency Use  Authorization (EUA) for use by authorized laboratories. . Due to the current public health emergency, [facility] is receiving a high volume of samples from a wide variety of swabs and media for COVID-19 testing. In order to serve patients during this public health crisis, samples from appropriate clinical sources are  being tested. Negative test results derived from specimens received in non-commercially manufactured viral collection and transport media, or in media and sample collection                           kits not yet authorized by FDA for COVID-19 testing should be cautiously evaluated and the patient potentially subjected to extra precautions such as additional clinical monitoring, including collection of an additional specimen. . Methodology:  Nucleic Acid Amplification Test (NAAT) includes RT-PCR or TMA . Additional information about COVID-19 can be found at the [employer] website: [email or web address].    Patient presents today stating that he feels worse.  Yesterday he had a fever up to 102.  Therefore this is 10 days of reoccurring fever usually at night.  He is also having severe night sweats.  He has a dull headache.  He is also having watery diarrhea frequently throughout the day and occasional lower abdominal pain.  All this began the day after his cataract surgery.  Blood cultures are negative thus far.  He is here today to discuss other possibilities.  At the present time his symptoms primarily are fever, night sweats, fatigue, worsening  diarrhea.  My concern is that he has an anterovirus and may be a mild viral meningitis.  However also on the differential diagnosis would be tickborne meningitis such as Lyme disease.  C. difficile colitis would certainly be a possibility given the fact it occurred after being in a medical office.  Bacterial gastroenteritis would be unlikely but is on the differential diagnosis.  Also given the headaches, the neck stiffness, the fever the chills and the night sweats, lymphoma would be on the differential diagnosis. Past Surgical History:  Procedure Laterality Date  . COLONOSCOPY N/A 04/27/2013   Procedure: COLONOSCOPY;  Surgeon: Jamesetta So, MD;  Location: [facility];  Service: Gastroenterology;  Laterality: N/A;  . GANGLION CYST EXCISION Right   . KNEE ARTHROSCOPY Bilateral    open procedure  . LYMPHADENECTOMY Bilateral 03/03/2014   Procedure: LYMPHADENECTOMY;  Surgeon: Raynelle Bring, MD;  Location: [facility];  Service: Urology;  Laterality: Bilateral;  . NASAL SEPTOPLASTY W/ TURBINOPLASTY    . PROSTATE SURGERY N/A    [employer] 07/08/2020  . ROBOT ASSISTED LAPAROSCOPIC RADICAL PROSTATECTOMY N/A 03/03/2014   Procedure: ROBOTIC ASSISTED LAPAROSCOPIC RADICAL PROSTATECTOMY LEVEL 2;  Surgeon: Raynelle Bring, MD;  Location: [facility];  Service: Urology;  Laterality: N/A;  . TONSILLECTOMY     child  . TOOTH EXTRACTION     preparing for tooth implant  . VASECTOMY N/A    [employer] 07/08/2020   Past Medical History:  Diagnosis Date  . Arthritis    arthritis-generalized-knees, elbows  . Elevated PSA   . Hypercholesteremia   . Hyperlipidemia   . Prostate cancer ([place])    dx. prostate cancer after bx. 2'15-now surgery planned  . Retinal detachment   . Seasonal allergies    Current Outpatient Medications on File Prior to Visit  Medication Sig Dispense Refill  . cyclobenzaprine (FLEXERIL) 10 MG tablet Take 1 tablet (10 mg total) by mouth 3 (three) times daily as needed for muscle spasms. 30 tablet  0   . levocetirizine (XYZAL) 5 MG tablet Take 1 tablet (5 mg total) by mouth every evening. 30 tablet 5  . LORazepam (ATIVAN) 1 MG tablet TAKE (1) TABLET BY MOUTH TWICE DAILY AS NEEDED FOR ANXIETY. 20 tablet 0  . rosuvastatin (CRESTOR) 5 MG tablet TAKE ONE TABLET BY MOUTH ONCE DAILY. 90 tablet 2  . tiZANidine (ZANAFLEX) 4 MG tablet Take 1 tablet (4 mg total) by mouth every 6 (six) hours as needed (headaches). 30 tablet 0   No current facility-administered medications on file prior to visit.   No Active Allergies Social History   Socioeconomic History  . Marital status: Married    Spouse name: Not on file  . Number of children: Not on file  . Years of education: Not on file  . Highest education level: Not on file  Occupational History  . Not on file  Tobacco Use  . Smoking status: Former Smoker    Packs/day: 0.50    Years: 15.00    Pack years: 7.50    Types: Cigarettes    Quit date: 04/27/1989    Years since quitting: 31.3  . Smokeless tobacco: Never Used  Substance and Sexual Activity  . Alcohol use: Yes    Alcohol/week: 1.0 standard drink    Types: 1 Glasses of wine per week    Comment: moderate - wine every 3 rd day  . Drug use: No  . Sexual activity: Yes    Birth control/protection: None  Other Topics Concern  . Not on file  Social History Narrative  . Not on file   Social Determinants of Health   Financial Resource Strain:   . Difficulty of Paying Living Expenses: Not on file  Food Insecurity:   . Worried About Charity fundraiser in the Last Year: Not on file  . Ran Out of Food in the Last Year: Not on file  Transportation Needs:   . Lack of Transportation (Medical): Not on file  . Lack of Transportation (Non-Medical): Not on file  Physical Activity:   . Days of Exercise per Week: Not on file  . Minutes of Exercise per Session: Not on file  Stress:   . Feeling of Stress : Not on file  Social Connections:   . Frequency of Communication with Friends and Family:  Not on file  . Frequency of Social Gatherings with Friends and Family: Not on file  . Attends Religious Services: Not on file  . Active Member of Clubs or Organizations: Not on file  . Attends Archivist Meetings: Not on file  . Marital Status: Not on file  Intimate Partner Violence:   . Fear of Current or Ex-Partner: Not on file  . Emotionally Abused: Not on file  . Physically Abused: Not on file  . Sexually Abused: Not on file      Review of Systems  Neurological: Positive for headaches.  All other systems reviewed and are negative.      Objective:   Physical Exam Vitals reviewed.  Constitutional:      General: He is not in acute distress.    Appearance: Normal appearance. He is normal weight. He is not ill-appearing, toxic-appearing or diaphoretic.  HENT:     Head: Normocephalic and atraumatic.     Right Ear: Tympanic membrane, ear canal and external ear normal. There is no impacted cerumen.     Left Ear: Tympanic membrane, ear canal and external ear normal. There is no impacted cerumen.  Nose: Nose normal. No congestion or rhinorrhea.     Mouth/Throat:     Mouth: Mucous membranes are moist.     Pharynx: Oropharynx is clear. No oropharyngeal exudate or posterior oropharyngeal erythema.  Eyes:     General: No scleral icterus.       Right eye: No discharge.        Left eye: No discharge.     Extraocular Movements: Extraocular movements intact.     Conjunctiva/sclera: Conjunctivae normal.     Pupils: Pupils are equal, round, and reactive to light.  Neck:     Vascular: No carotid bruit.  Cardiovascular:     Rate and Rhythm: Normal rate and regular rhythm.     Pulses: Normal pulses.     Heart sounds: Normal heart sounds. No murmur heard.  No friction rub. No gallop.   Pulmonary:     Effort: Pulmonary effort is normal. No respiratory distress.     Breath sounds: Normal breath sounds. No stridor. No wheezing, rhonchi or rales.  Chest:     Chest wall: No  tenderness.  Abdominal:     General: Bowel sounds are normal. There is no distension.     Palpations: Abdomen is soft. There is no mass.     Tenderness: There is no abdominal tenderness. There is no right CVA tenderness, left CVA tenderness, guarding or rebound.     Hernia: No hernia is present.  Musculoskeletal:     Cervical back: Normal range of motion and neck supple. No rigidity.     Right lower leg: No edema.     Left lower leg: No edema.  Lymphadenopathy:     Cervical: No cervical adenopathy.  Skin:    General: Skin is warm.     Coloration: Skin is not jaundiced or pale.     Findings: No bruising, erythema, lesion or rash.  Neurological:     General: No focal deficit present.     Mental Status: He is alert and oriented to person, place, and time.     Cranial Nerves: No cranial nerve deficit.     Sensory: No sensory deficit.     Motor: No weakness.     Coordination: Coordination normal.     Gait: Gait normal.     Deep Tendon Reflexes: Reflexes normal.  Psychiatric:        Mood and Affect: Mood normal.        Behavior: Behavior normal.        Thought Content: Thought content normal.        Judgment: Judgment normal.           Assessment & Plan:  Diarrhea of infectious origin - Plan: Gastrointestinal Pathogen Panel PCR, C. difficile GDH and Toxin A/B, B. burgdorfi antibodies by WB, [place] [facility] spotted fvr abs pnl(IgG+IgM)  Fever and chills  Episodic tension-type headache, not intractable  I explained to the patient that I am not certain of his diagnosis.  If I had to guess I believe he has a viral syndrome due to an Enterra virus coupled with some mild viral meningitis from the Enterra virus.  However symptoms now persisted for 10 days and seem to be getting worse.  He certainly does not appear to be toxic enough to require hospitalization or a spinal tap.  Therefore I recommended that we send off a GI pathogen panel, check his stool for C. difficile GDH and toxin AMB and  also check serologies for [facility] spotted fever and Lyme  disease to rule out tickborne meningitis. Start him on Cipro and Flagyl to cover colitis of a bacterial origin.  Flagyl would also potentially cover C. difficile.  However if his C. difficile test returns positive, I would switch him to oral vancomycin.  If next week, he continues to have fever and diarrhea and systemic symptoms, I would recommend a CT scan of the abdomen and pelvis to evaluate for any lymphadenopathy/abscess

## 2020-08-20 ENCOUNTER — Emergency Department (HOSPITAL_COMMUNITY): Payer: Medicare PPO

## 2020-08-20 ENCOUNTER — Encounter (HOSPITAL_COMMUNITY): Payer: Self-pay | Admitting: *Deleted

## 2020-08-20 ENCOUNTER — Inpatient Hospital Stay (HOSPITAL_COMMUNITY)
Admission: EM | Admit: 2020-08-20 | Discharge: 2020-08-25 | DRG: 392 | Disposition: A | Discharge status: Home / Self Care | Payer: Medicare PPO | Attending: Internal Medicine | Admitting: Internal Medicine

## 2020-08-20 ENCOUNTER — Other Ambulatory Visit: Payer: Self-pay

## 2020-08-20 DIAGNOSIS — E785 Hyperlipidemia, unspecified: Secondary | ICD-10-CM

## 2020-08-20 DIAGNOSIS — R7401 Elevation of levels of liver transaminase levels: Secondary | ICD-10-CM | POA: Diagnosis not present

## 2020-08-20 DIAGNOSIS — R000 Tachycardia, unspecified: Secondary | ICD-10-CM | POA: Diagnosis not present

## 2020-08-20 DIAGNOSIS — R109 Unspecified abdominal pain: Secondary | ICD-10-CM

## 2020-08-20 DIAGNOSIS — K5792 Diverticulitis of intestine, part unspecified, without perforation or abscess without bleeding: Secondary | ICD-10-CM | POA: Diagnosis not present

## 2020-08-20 DIAGNOSIS — R519 Headache, unspecified: Secondary | ICD-10-CM | POA: Diagnosis not present

## 2020-08-20 DIAGNOSIS — Z8546 Personal history of malignant neoplasm of prostate: Secondary | ICD-10-CM

## 2020-08-20 DIAGNOSIS — M1990 Unspecified osteoarthritis, unspecified site: Secondary | ICD-10-CM | POA: Diagnosis present

## 2020-08-20 DIAGNOSIS — R140 Abdominal distension (gaseous): Secondary | ICD-10-CM

## 2020-08-20 DIAGNOSIS — A0839 Other viral enteritis: Secondary | ICD-10-CM | POA: Diagnosis not present

## 2020-08-20 DIAGNOSIS — R7989 Other specified abnormal findings of blood chemistry: Secondary | ICD-10-CM

## 2020-08-20 DIAGNOSIS — R197 Diarrhea, unspecified: Secondary | ICD-10-CM

## 2020-08-20 DIAGNOSIS — M549 Dorsalgia, unspecified: Secondary | ICD-10-CM

## 2020-08-20 DIAGNOSIS — E860 Dehydration: Secondary | ICD-10-CM | POA: Diagnosis present

## 2020-08-20 DIAGNOSIS — R748 Abnormal levels of other serum enzymes: Secondary | ICD-10-CM | POA: Diagnosis present

## 2020-08-20 DIAGNOSIS — Z20822 Contact with and (suspected) exposure to covid-19: Secondary | ICD-10-CM | POA: Diagnosis not present

## 2020-08-20 DIAGNOSIS — R933 Abnormal findings on diagnostic imaging of other parts of digestive tract: Secondary | ICD-10-CM

## 2020-08-20 DIAGNOSIS — R059 Cough, unspecified: Secondary | ICD-10-CM | POA: Diagnosis not present

## 2020-08-20 DIAGNOSIS — E871 Hypo-osmolality and hyponatremia: Secondary | ICD-10-CM | POA: Diagnosis present

## 2020-08-20 DIAGNOSIS — R112 Nausea with vomiting, unspecified: Secondary | ICD-10-CM

## 2020-08-20 DIAGNOSIS — Z87891 Personal history of nicotine dependence: Secondary | ICD-10-CM

## 2020-08-20 DIAGNOSIS — R1032 Left lower quadrant pain: Secondary | ICD-10-CM | POA: Diagnosis not present

## 2020-08-20 DIAGNOSIS — K828 Other specified diseases of gallbladder: Secondary | ICD-10-CM | POA: Diagnosis not present

## 2020-08-20 LAB — URINALYSIS, ROUTINE W REFLEX MICROSCOPIC
Bacteria, UA: NONE SEEN
Bilirubin Urine: NEGATIVE
Glucose, UA: NEGATIVE mg/dL
Hgb urine dipstick: NEGATIVE
Ketones, ur: 20 mg/dL — AB
Nitrite: NEGATIVE
Protein, ur: NEGATIVE mg/dL
Specific Gravity, Urine: 1.025 (ref 1.005–1.030)
pH: 5 (ref 5.0–8.0)

## 2020-08-20 LAB — CBC WITH DIFFERENTIAL/PLATELET
Absolute Monocytes: 599 cells/uL (ref 200–950)
Basophils Absolute: 38 cells/uL (ref 0–200)
Basophils Relative: 0.6 %
Eosinophils Absolute: 189 cells/uL (ref 15–500)
Eosinophils Relative: 3 %
HCT: 46.4 % (ref 38.5–50.0)
Hemoglobin: 15.1 g/dL (ref 13.2–17.1)
Lymphs Abs: 1613 cells/uL (ref 850–3900)
MCH: 29.8 pg (ref 27.0–33.0)
MCHC: 32.5 g/dL (ref 32.0–36.0)
MCV: 91.7 fL (ref 80.0–100.0)
MPV: 10.3 fL (ref 7.5–12.5)
Monocytes Relative: 9.5 %
Neutro Abs: 3862 cells/uL (ref 1500–7800)
Neutrophils Relative %: 61.3 %
Platelets: 172 10*3/uL (ref 140–400)
RBC: 5.06 10*6/uL (ref 4.20–5.80)
RDW: 12.9 % (ref 11.0–15.0)
Total Lymphocyte: 25.6 %
WBC: 6.3 10*3/uL (ref 3.8–10.8)

## 2020-08-20 LAB — CBC
HCT: 46.8 % (ref 39.0–52.0)
Hemoglobin: 15.5 g/dL (ref 13.0–17.0)
MCH: 30.3 pg (ref 26.0–34.0)
MCHC: 33.1 g/dL (ref 30.0–36.0)
MCV: 91.6 fL (ref 80.0–100.0)
Platelets: 199 10*3/uL (ref 150–400)
RBC: 5.11 MIL/uL (ref 4.22–5.81)
RDW: 13.3 % (ref 11.5–15.5)
WBC: 15.7 10*3/uL — ABNORMAL HIGH (ref 4.0–10.5)
nRBC: 0 % (ref 0.0–0.2)

## 2020-08-20 LAB — COMPREHENSIVE METABOLIC PANEL
ALT: 297 U/L — ABNORMAL HIGH (ref 0–44)
AST: 280 U/L — ABNORMAL HIGH (ref 15–41)
Albumin: 3.2 g/dL — ABNORMAL LOW (ref 3.5–5.0)
Alkaline Phosphatase: 226 U/L — ABNORMAL HIGH (ref 38–126)
Anion gap: 9 (ref 5–15)
BUN: 16 mg/dL (ref 8–23)
CO2: 26 mmol/L (ref 22–32)
Calcium: 8.4 mg/dL — ABNORMAL LOW (ref 8.9–10.3)
Chloride: 99 mmol/L (ref 98–111)
Creatinine, Ser: 1.38 mg/dL — ABNORMAL HIGH (ref 0.61–1.24)
GFR, Estimated: 55 mL/min — ABNORMAL LOW (ref >60)
Glucose, Bld: 110 mg/dL — ABNORMAL HIGH (ref 70–99)
Potassium: 4.3 mmol/L (ref 3.5–5.1)
Sodium: 134 mmol/L — ABNORMAL LOW (ref 135–145)
Total Bilirubin: 1 mg/dL (ref 0.3–1.2)
Total Protein: 6.4 g/dL — ABNORMAL LOW (ref 6.5–8.1)

## 2020-08-20 LAB — COMPLETE METABOLIC PANEL WITH GFR
AG Ratio: 1.5 (calc) (ref 1.0–2.5)
ALT: 38 U/L (ref 9–46)
AST: 35 U/L (ref 10–35)
Albumin: 4 g/dL (ref 3.6–5.1)
Alkaline phosphatase (APISO): 91 U/L (ref 35–144)
BUN/Creatinine Ratio: 13 (calc) (ref 6–22)
BUN: 18 mg/dL (ref 7–25)
CO2: 25 mmol/L (ref 20–32)
Calcium: 8.7 mg/dL (ref 8.6–10.3)
Chloride: 101 mmol/L (ref 98–110)
Creat: 1.41 mg/dL — ABNORMAL HIGH (ref 0.70–1.18)
GFR, Est African American: 58 mL/min/{1.73_m2} — ABNORMAL LOW (ref >=60)
GFR, Est Non African American: 50 mL/min/{1.73_m2} — ABNORMAL LOW (ref >=60)
Globulin: 2.7 g/dL (calc) (ref 1.9–3.7)
Glucose, Bld: 84 mg/dL (ref 65–99)
Potassium: 4 mmol/L (ref 3.5–5.3)
Sodium: 137 mmol/L (ref 135–146)
Total Bilirubin: 0.7 mg/dL (ref 0.2–1.2)
Total Protein: 6.7 g/dL (ref 6.1–8.1)

## 2020-08-20 LAB — CULTURE, BLOOD (SINGLE): MICRO NUMBER:: 11142934

## 2020-08-20 LAB — RESPIRATORY PANEL BY RT PCR (FLU A&B, COVID)
Influenza A by PCR: NEGATIVE
Influenza B by PCR: NEGATIVE
SARS Coronavirus 2 by RT PCR: NEGATIVE

## 2020-08-20 LAB — ACETAMINOPHEN LEVEL: Acetaminophen (Tylenol), Serum: <10 ug/mL — ABNORMAL LOW (ref 10–30)

## 2020-08-20 LAB — LIPASE, BLOOD: Lipase: 27 U/L (ref 11–51)

## 2020-08-20 LAB — LACTIC ACID, PLASMA: Lactic Acid, Venous: 1.6 mmol/L (ref 0.5–1.9)

## 2020-08-20 MED ORDER — METRONIDAZOLE IN NACL 5-0.79 MG/ML-% IV SOLN
500.0000 mg | Freq: Once | INTRAVENOUS | Status: AC
  Administered 2020-08-20: 500 mg via INTRAVENOUS
  Filled 2020-08-20: qty 100

## 2020-08-20 MED ORDER — CIPROFLOXACIN IN D5W 400 MG/200ML IV SOLN
400.0000 mg | Freq: Once | INTRAVENOUS | Status: AC
  Administered 2020-08-20: 400 mg via INTRAVENOUS
  Filled 2020-08-20: qty 200

## 2020-08-20 MED ORDER — GATIFLOXACIN 0.5 % OP SOLN
1.0000 [drp] | Freq: Three times a day (TID) | OPHTHALMIC | Status: DC
  Administered 2020-08-21 – 2020-08-24 (×7): 1 [drp] via OPHTHALMIC
  Filled 2020-08-20: qty 2.5

## 2020-08-20 MED ORDER — DIFLUPREDNATE 0.05 % OP EMUL
1.0000 [drp] | Freq: Three times a day (TID) | OPHTHALMIC | Status: DC
  Administered 2020-08-24: 11:00:00 1 [drp] via OPHTHALMIC

## 2020-08-20 MED ORDER — FENTANYL CITRATE (PF) 100 MCG/2ML IJ SOLN
25.0000 ug | INTRAMUSCULAR | Status: DC | PRN
  Administered 2020-08-21 – 2020-08-22 (×5): 25 ug via INTRAVENOUS
  Filled 2020-08-20 (×5): qty 2

## 2020-08-20 MED ORDER — LACTATED RINGERS IV SOLN: INTRAVENOUS | Status: DC

## 2020-08-20 MED ORDER — SIMETHICONE 40 MG/0.6ML PO SUSP (UNIT DOSE)
40.0000 mg | Freq: Once | ORAL | Status: AC
  Administered 2020-08-20: 40 mg via ORAL
  Filled 2020-08-20: qty 0.6

## 2020-08-20 MED ORDER — ONDANSETRON HCL 4 MG/2ML IJ SOLN
4.0000 mg | Freq: Once | INTRAMUSCULAR | Status: AC
  Administered 2020-08-20: 4 mg via INTRAVENOUS
  Filled 2020-08-20: qty 2

## 2020-08-20 MED ORDER — IOHEXOL 300 MG/ML  SOLN
100.0000 mL | Freq: Once | INTRAMUSCULAR | Status: AC | PRN
  Administered 2020-08-20: 100 mL via INTRAVENOUS

## 2020-08-20 MED ORDER — METRONIDAZOLE IN NACL 5-0.79 MG/ML-% IV SOLN
500.0000 mg | Freq: Three times a day (TID) | INTRAVENOUS | Status: DC
  Administered 2020-08-21 – 2020-08-24 (×10): 500 mg via INTRAVENOUS
  Filled 2020-08-20 (×10): qty 100

## 2020-08-20 MED ORDER — FENTANYL CITRATE (PF) 100 MCG/2ML IJ SOLN
25.0000 ug | Freq: Once | INTRAMUSCULAR | Status: AC
  Administered 2020-08-20: 25 ug via INTRAVENOUS
  Filled 2020-08-20: qty 2

## 2020-08-20 MED ORDER — SODIUM CHLORIDE 0.9 % IV BOLUS
1000.0000 mL | Freq: Once | INTRAVENOUS | Status: AC
  Administered 2020-08-20: 1000 mL via INTRAVENOUS

## 2020-08-20 MED ORDER — KETOROLAC TROMETHAMINE 0.5 % OP SOLN
1.0000 [drp] | Freq: Every day | OPHTHALMIC | Status: DC
  Administered 2020-08-21: 1 [drp] via OPHTHALMIC
  Filled 2020-08-20: qty 5

## 2020-08-20 MED ORDER — CIPROFLOXACIN IN D5W 400 MG/200ML IV SOLN
400.0000 mg | Freq: Two times a day (BID) | INTRAVENOUS | Status: DC
  Administered 2020-08-21 – 2020-08-24 (×7): 400 mg via INTRAVENOUS
  Filled 2020-08-20 (×7): qty 200

## 2020-08-20 MED ORDER — ONDANSETRON HCL 4 MG/2ML IJ SOLN
4.0000 mg | Freq: Four times a day (QID) | INTRAMUSCULAR | Status: DC | PRN
  Administered 2020-08-22: 4 mg via INTRAVENOUS
  Filled 2020-08-20: qty 2

## 2020-08-20 NOTE — ED Provider Notes (Signed)
[facility] DEPT Provider Note   CSN: [id number] Arrival date & time: 08/20/20  1710     History Chief Complaint  Patient presents with  . Fever    Dwayne Young is a 69 y.o. male.  HPI 69 year old very pleasant male with a history of prostate cancer with prostatectomy over 5 years ago, arthritis, hypercholesteremia, hyperlipidemia presents to the ER with 13 days of recurrent fevers, nausea, vomiting, poor p.o. intake and generalized abdominal pain.  Patient reports that he feels like his abdomen is very distended.  He has been seen by his PCP twice, has had CBC, CMP, Covid test, single blood culture which per chart review appears to have not had enough specimen to test.  I personally reviewed these, these were all normal at that time.  He then returned to his PCP on the 4th and began to testing for tickborne diseases and sent off a C. difficile and gastric panel which has not returned.  Was also put on Cipro and Flagyl prophylactically.  patient states he continues to have on and off fevers, and is overall feeling poorly.  He has nausea and nonbloody vomiting, with watery diarrhea with no evidence of blood.  He denies any history of alcoholism, states he has not had an alcoholic drink in 2 years.  Denies any history of drug use.  Denies any dysuria, flank pain.  Per PCP notes, he thought that this was a viral gastroenteritis and had question meningitis.  Patient complains of a headache, but denies any neck stiffness, light sensitivity.    Past Medical History:  Diagnosis Date  . Arthritis    arthritis-generalized-knees, elbows  . Elevated PSA   . Hypercholesteremia   . Hyperlipidemia   . Prostate cancer ([place])    dx. prostate cancer after bx. 2'15-now surgery planned  . Retinal detachment   . Seasonal allergies     Patient Active Problem List   Diagnosis Date Noted  . BPPV (benign paroxysmal positional vertigo), unspecified laterality 08/06/2016  .  Episodic tension-type headache, not intractable 08/06/2016  . Subjective tinnitus, bilateral 08/06/2016  . Malignant neoplasm of prostate ([place]) 03/03/2014  . Retinal detachment   . Hyperlipidemia   . Elevated PSA     Past Surgical History:  Procedure Laterality Date  . COLONOSCOPY N/A 04/27/2013   Procedure: COLONOSCOPY;  Surgeon: Jamesetta So, MD;  Location: [facility];  Service: Gastroenterology;  Laterality: N/A;  . GANGLION CYST EXCISION Right   . KNEE ARTHROSCOPY Bilateral    open procedure  . LYMPHADENECTOMY Bilateral 03/03/2014   Procedure: LYMPHADENECTOMY;  Surgeon: Raynelle Bring, MD;  Location: [facility];  Service: Urology;  Laterality: Bilateral;  . NASAL SEPTOPLASTY W/ TURBINOPLASTY    . PROSTATE SURGERY N/A    [employer]rees[employer] 07/08/2020  . ROBOT ASSISTED LAPAROSCOPIC RADICAL PROSTATECTOMY N/A 03/03/2014   Procedure: ROBOTIC ASSISTED LAPAROSCOPIC RADICAL PROSTATECTOMY LEVEL 2;  Surgeon: Raynelle Bring, MD;  Location: [facility];  Service: Urology;  Laterality: N/A;  . TONSILLECTOMY     child  . TOOTH EXTRACTION     preparing for tooth implant  . VASECTOMY N/A    [employer]rees[employer] 07/08/2020       No family history on file.  Social History   Tobacco Use  . Smoking status: Former Smoker    Packs/day: 0.50    Years: 15.00    Pack years: 7.50    Types: Cigarettes    Quit date: 04/27/1989    Years since quitting: 31.3  .  Smokeless tobacco: Never Used  Substance Use Topics  . Alcohol use: Yes    Alcohol/week: 1.0 standard drink    Types: 1 Glasses of wine per week    Comment: moderate - wine every 3 rd day  . Drug use: No    Home Medications Prior to Admission medications   Medication Sig Start Date End Date Taking? Authorizing Provider  acetaminophen (TYLENOL) 650 MG CR tablet Take 1,300 mg by mouth every 8 (eight) hours as needed for pain.   Yes [provider]  BESIVANCE 0.6 % SUSP Place 1 drop into the right eye 3 (three) times daily. 08/08/20  Yes [provider]  ciprofloxacin (CIPRO) 500 MG tablet Take 1 tablet (500 mg total) by mouth 2 (two) times daily. 08/18/20  Yes Susy Frizzle, MD  cyclobenzaprine (FLEXERIL) 10 MG tablet Take 1 tablet (10 mg total) by mouth 3 (three) times daily as needed for muscle spasms. 07/11/20  Yes Susy Frizzle, MD  diclofenac Sodium (VOLTAREN) 1 % GEL Apply 1 application topically daily as needed (pain).   Yes [provider]  DUREZOL 0.05 % EMUL Place 1 drop into the left eye 3 (three) times daily. 08/08/20  Yes [provider]  LORazepam (ATIVAN) 1 MG tablet TAKE (1) TABLET BY MOUTH TWICE DAILY AS NEEDED FOR ANXIETY. Patient taking differently: Take 1 mg by mouth at bedtime as needed for sleep.  07/11/20  Yes Susy Frizzle, MD  metroNIDAZOLE (FLAGYL) 500 MG tablet Take 1 tablet (500 mg total) by mouth 3 (three) times daily. 08/18/20  Yes Susy Frizzle, MD  Omega-3 Fatty Acids (FISH OIL) 500 MG CAPS Take 500 mg by mouth daily.   Yes [provider]  PROLENSA 0.07 % SOLN Place 1 drop into the left eye at bedtime. 08/08/20  Yes [provider]  rosuvastatin (CRESTOR) 5 MG tablet TAKE ONE TABLET BY MOUTH ONCE DAILY. Patient taking differently: Take 5 mg by mouth daily.  08/09/20  Yes Susy Frizzle, MD  tiZANidine (ZANAFLEX) 4 MG tablet Take 1 tablet (4 mg total) by mouth every 6 (six) hours as needed (headaches). 08/17/20  Yes Susy Frizzle, MD    Allergies    Patient has no known allergies.  Review of Systems   Review of Systems  Constitutional: Negative for chills and fever.  HENT: Negative for ear pain and sore throat.   Eyes: Negative for pain and visual disturbance.  Respiratory: Negative for cough and shortness of breath.   Cardiovascular: Negative for chest pain and palpitations.  Gastrointestinal: Positive for abdominal pain, diarrhea, nausea and vomiting.  Genitourinary: Negative for dysuria and hematuria.  Musculoskeletal: Negative for  arthralgias and back pain.  Skin: Negative for color change and rash.  Neurological: Negative for seizures and syncope.  All other systems reviewed and are negative.   Physical Exam Updated Vital Signs BP (!) 144/80   Pulse (!) 107   Temp 100 F (37.8 C) (Oral)   Resp 20   SpO2 97%   Physical Exam Vitals and nursing note reviewed.  Constitutional:      General: He is not in acute distress.    Appearance: He is well-developed. He is ill-appearing. He is not toxic-appearing or diaphoretic.  HENT:     Head: Normocephalic and atraumatic.  Eyes:     Conjunctiva/sclera: Conjunctivae normal.  Cardiovascular:     Rate and Rhythm: Normal rate and regular rhythm.     Heart sounds: No murmur  heard.   Pulmonary:     Effort: Pulmonary effort is normal. No respiratory distress.     Breath sounds: Normal breath sounds.  Abdominal:     General: There is distension.     Palpations: Abdomen is soft.     Tenderness: There is abdominal tenderness. There is no right CVA tenderness or left CVA tenderness.     Comments: Generalized abdominal tenderness.  There is some distention noted however abdomen is soft.  No peritoneal signs.  No visible distended veins, no bruising noted to the abdomen  Musculoskeletal:        General: No tenderness or deformity. Normal range of motion.     Cervical back: Neck supple.  Skin:    General: Skin is warm and dry.     Capillary Refill: Capillary refill takes less than 2 seconds.     Findings: No erythema or rash.  Neurological:     General: No focal deficit present.     Mental Status: He is alert and oriented to person, place, and time.  Psychiatric:        Mood and Affect: Mood normal.        Behavior: Behavior normal.     ED Results / Procedures / Treatments   Labs (all labs ordered are listed, but only abnormal results are displayed) Labs Reviewed  COMPREHENSIVE METABOLIC PANEL - Abnormal; Notable for the following components:      Result Value    Sodium 134 (*)    Glucose, Bld 110 (*)    Creatinine, Ser 1.38 (*)    Calcium 8.4 (*)    Total Protein 6.4 (*)    Albumin 3.2 (*)    AST 280 (*)    ALT 297 (*)    Alkaline Phosphatase 226 (*)    GFR, Estimated 55 (*)    All other components within normal limits  CBC - Abnormal; Notable for the following components:   WBC 15.7 (*)    All other components within normal limits  URINALYSIS, ROUTINE W REFLEX MICROSCOPIC - Abnormal; Notable for the following components:   Color, Urine AMBER (*)    Ketones, ur 20 (*)    Leukocytes,Ua TRACE (*)    All other components within normal limits  RESPIRATORY PANEL BY RT PCR (FLU A&B, COVID)  C DIFFICILE QUICK SCREEN W PCR REFLEX  CULTURE, BLOOD (ROUTINE X 2)  CULTURE, BLOOD (ROUTINE X 2)  GASTROINTESTINAL PANEL BY PCR, STOOL (REPLACES STOOL CULTURE)  LIPASE, BLOOD  LACTIC ACID, PLASMA  ACETAMINOPHEN LEVEL    EKG EKG Interpretation  Date/Time:  Sunday August 20 2020 20:12:37 EST Ventricular Rate:  107 PR Interval:    QRS Duration: 80 QT Interval:  315 QTC Calculation: 421 R Axis:   19 Text Interpretation: Sinus tachycardia Probable left atrial enlargement Low voltage, precordial leads Consider anterior infarct SINCE LAST TRACING HEART RATE HAS INCREASED Confirmed by Malvin Johns [phone]) on 08/20/2020 8:32:52 PM   Radiology DG Chest Portable 1 View  Result Date: 08/20/2020 CLINICAL DATA:  Cough EXAM: PORTABLE CHEST 1 VIEW COMPARISON:  April 08, 2016 FINDINGS: There is no definite acute cardiopulmonary process. The heart size is unremarkable. There is no pneumothorax or pleural effusion. There is a rounded 1 cm density in the left mid lung zone that appears to be new since 2017. IMPRESSION: 1. No definite acute cardiopulmonary process. 2. Rounded density in the left mid lung zone. A 4-6 week follow-up two-view chest x-ray is recommended as an outpatient to  confirm stability or resolution of this finding. Electronically Signed   By:  Constance Holster M.D.   On: 08/20/2020 20:36   US Abdomen Limited RUQ (LIVER/GB)  Result Date: 08/20/2020 CLINICAL DATA:  Initial evaluation for elevated LFTs. History of prostate cancer. EXAM: ULTRASOUND ABDOMEN LIMITED RIGHT UPPER QUADRANT COMPARISON:  None available. FINDINGS: Gallbladder: Layering echogenic sludge present within the gallbladder lumen. No frank shadowing stones. Gallbladder wall measures at the upper limits of normal at 3 mm. No free pericholecystic fluid. No sonographic Murphy sign elicited on exam. Common bile duct: Diameter: 2.4 mm Liver: Liver demonstrates a somewhat coarsened echotexture with nodular contour. No focal intrahepatic lesions. Portal vein is patent on color Doppler imaging with normal direction of blood flow towards the liver. Other: Incidental note made of a right pleural effusion. IMPRESSION: 1. Gallbladder sludge without cholelithiasis or other sonographic features to suggest acute cholecystitis. 2. No biliary dilatation. 3. Coarse echotexture of the liver with nodular contour, suggesting possible cirrhosis. No focal intrahepatic lesions. 4. Right pleural effusion. Electronically Signed   By: Jeannine Boga M.D.   On: 08/20/2020 19:57    Procedures Procedures (including critical care time)  Medications Ordered in ED Medications  sodium chloride 0.9 % bolus 1,000 mL (0 mLs Intravenous Stopped 08/20/20 2144)  ondansetron (ZOFRAN) injection 4 mg (4 mg Intravenous Given 08/20/20 2016)  simethicone (MYLICON) 40 DG/3.8VF suspension 40 mg (40 mg Oral Given 08/20/20 2156)  fentaNYL (SUBLIMAZE) injection 25 mcg (25 mcg Intravenous Given 08/20/20 2155)  iohexol (OMNIPAQUE) 300 MG/ML solution 100 mL (100 mLs Intravenous Contrast Given 08/20/20 2201)    ED Course  I have reviewed the triage vital signs and the nursing notes.  Pertinent labs & imaging results that were available during my care of the patient were reviewed by me and considered in my medical  decision making (see chart for details).  Clinical Course as of Aug 20 2228  Nancy Fetter Aug 20, 2020  1939 AST(!): 280 [MB]  1940 ALT(!): 297 [MB]  1940 Alkaline Phosphatase(!): 226 [MB]  1940 WBC(!): 15.7 [MB]  1946 Creatinine(!): 1.38 [MB]    Clinical Course User Index [MB] Lyndel Safe   MDM Rules/Calculators/A&P                         69 year old male with recurrent fevers, nausea, vomiting, abdominal pain, diarrhea On presentation, he is alert, oriented, ill-appearing, however moving about the room without difficulty.  He was slightly hypertensive with a blood pressure of 144/84, tachycardic at 107, with a borderline fever of 100.  Physical exam with notable abdominal distention, generalized tenderness, no flank tenderness noted.  Lung sounds clear.  DDx includes viral gastroenteritis, C. difficile, SBP, COVID-19, UTI, sepsis, meningitis, cancer, viral vs other hepatitis, tick borne disease, tylenol toxicity   Labs reviewed and interpreted by me -CBC with a leukocytosis of 15.7 -CMP with mild hyponatremia, elevated creatinine 1.38 which is slightly above baseline, most notably, his AST and ALT are 280 and 297 respectively with a evaded alk phos of 226.  Patient has no prior history of any liver disease. -Lipase normal -Patient had a negative Covid test 5 days ago -Other labs including lactate, blood cultures, C. difficile and GI panel pending  MDM: Given significant LFT elevation and abdominal pain, CT of the abdomen as well as right upper quadrant ultrasound ordered.  Patient started on IV fluids, Zofran.   Right upper quadrant ultrasound with some evidence of cirrhosis, thickened  gallbladder wall but no evidence of cholecystitis.  She was also seen and evaluated by Dr. Vanita Panda, will add on a Tylenol level.  Low suspicion for SBP right now as there are no peritoneal signs on exam.  CT of the abdomen is pending.  Patient signed out to Dr. Vanita Panda who will oversee his imaging,  suspect the patient will need admission for further evaluation and treatment.  Final Clinical Impression(s) / ED Diagnoses Final diagnoses:  LFT elevation    Rx / DC Orders ED Discharge Orders    None       Lyndel Safe 08/20/20 2229    Carmin Muskrat, MD 08/20/20 2303

## 2020-08-20 NOTE — ED Triage Notes (Signed)
Pt complains of fever, headache, intermittent abdominal pain for the past 13 day. He initially had diarrhea and vomiting. His PCP put him on Cipro and Flagyl for possible c. Diff and drew some labs. PCP wanted him to get evaluated for meningitis or lymphoma.

## 2020-08-20 NOTE — H&P (Signed)
History and Physical    PLEASE NOTE THAT DRAGON DICTATION SOFTWARE WAS USED IN THE CONSTRUCTION OF THIS NOTE.   Dwayne Young KVQ:[id number] DOB: [birth year] DOA: 08/20/2020  PCP: Susy Frizzle, MD Patient coming from: home   I have personally briefly reviewed patient's old medical records in [place]  Chief Complaint: Abdominal pain  HPI: Dwayne Young is a 69 y.o. male with medical history significant for hyperlipidemia who is admitted to [facility] on 08/20/2020 with suspected diverticulitis after presenting from home to [facility] Emergency Department complaining of abdominal pain.   The patient reports that he has been experiencing approximately 10 days sharp, nonradiating abdominal discomfort. He reports that the distribution of this abdominal discomfort is diffuse, involving all 4 abdominal quadrants, although he notes that the pain appears most intense in the left lower abdominal quadrant. Over that timeframe, he notes that the pain has been intermittent, worsening with movement as well as palpation of the abdomen. He also notes associated nausea resulting in at least 5-6 episodes of nonbloody, nonbilious emesis over that timeframe. Most recent episode of vomiting occurred earlier today. In the setting of ongoing nausea, the patient acknowledges diminished recent oral intake, including inability to take oral medications as an outpatient at this time. Within 1 to 2 days of the onset of nausea/vomiting, the patient notes development of loose stool. Subsequently, he reports 1-2 daily episodes of loose stool in the absence of any associated melena or hematochezia. Denies any recent travel. Does not believe he has been on any recent antibiotics, and does not take a PPI as an outpatient. He reports associated subjective fever, but denies any associated chills, generalized rigors, or generalized myalgias. He notes a mild intermittent headache over the course of the last  week, which has been symmetrical over the bilateral frontal lobes and not associated with any visual or olfactory aura. Denies any associated neck stiffness.  Denies any associated rhinitis, rhinorrhea, sore throat, shortness of breath, wheezing, cough, or rash. No known COVID-19 exposures. Denies any associated dysuria, gross hematuria, or change in urinary urgency/frequency. He also denies any recent chest pain, diaphoresis, palpitations, dizziness, presyncope, or syncope.  In the setting of the above symptoms, the patient has seen his PCP twice over the course of the last 10 days, most recently on 08/18/2020, at which time he was prescribed oral ciprofloxacin and Flagyl for potential diverticulitis versus gastroenteritis. However, as described above, the patient has been unable to take any of these oral antibiotics in the setting of ongoing nausea as well as associated episodes of vomiting.     ED Course:  Vital signs in the ED were notable for the following: Temperature max 100.0; initial heart rate 107, which improved to 93 with interval administration of IV fluids, as further qualified below; blood pressure ranged from 127/77-140 4/84; respiratory rate 16-21; oxygen saturation 97 to 98% on room air.  Labs were notable for the following: CMP was notable for the following: Sodium 134, potassium 4.3, chloride 99, bicarbonate 26, anion gap 9, creatinine 1.38 relative to 1.41 on 08/14/2020, and relative to apparent baseline creatinine 1.1-1.2; CMP was also notable for the following, with comparison to most recent prior associated values from 08/14/2020: albumin 3.2, alkaline phosphatase 226 compared to 91 on 08/14/2020, AST 280 compared to 35, ALT 297 compared to 38, total bilirubin 1.0 compared to 0.7. Lipase 27. Serum acetaminophen level found to be less than 10. CBC notable for the following: White blood  cell count of 15,700, which is relative to most recent prior value of 6300 on 08/14/2020, hemoglobin  15.5. Lactic acid 1.6. Urinalysis showed no white blood cells, no squamous epithelial cells, no bacteria, was nitrate negative, and was associated with a specific gravity of 1.025. COVID-19 PCR as well as influenza PCR performed in the ED this evening, and found to be negative. Blood cultures x2 were collected in the ED prior to initiation of any antibiotics. Gastrointestinal panel was ordered while in the ED, including C. difficile testing, with results currently pending.  Abdominal ultrasound showed gallbladder sludge without cholelithiasis and without evidence of acute cholecystitis. Abdominal ultrasound showed no evidence of dilation of the common bile duct, nor any evidence of choledocholithiasis. CT abdomen/pelvis with contrast showed mild wall thickening and inflammatory changes associated with the sigmoid colon potentially representing acute diverticulitis. CT abdomen/pelvis also showed mild wall thickening of the distal ileum, potentially representing enteritis in the absence of any evidence of bowel obstruction, abscess, or perforation.  While in the ED, the following were administered: Fentanyl 25 mcg IV x1, Zofran 4 mg IV x1, ciprofloxacin 4 mg IV x1, Flagyl 500 mg IV x1, and a 1 L normal saline bolus. Subsequently, the patient was admitted to the [place] floor for further evaluation and management of suspected presenting acute diverticulitis in the context of inability to tolerate p.o. antibiotics as an outpatient in the context of ongoing nausea/vomiting.    Review of Systems: As per HPI otherwise 10 point review of systems negative.   Past Medical History:  Diagnosis Date  . Arthritis    arthritis-generalized-knees, elbows  . Elevated PSA   . Hypercholesteremia   . Hyperlipidemia   . Prostate cancer ([place])    dx. prostate cancer after bx. 2'15-now surgery planned  . Retinal detachment   . Seasonal allergies     Past Surgical History:  Procedure Laterality Date  . COLONOSCOPY  N/A 04/27/2013   Procedure: COLONOSCOPY;  Surgeon: Jamesetta So, MD;  Location: [facility];  Service: Gastroenterology;  Laterality: N/A;  . GANGLION CYST EXCISION Right   . KNEE ARTHROSCOPY Bilateral    open procedure  . LYMPHADENECTOMY Bilateral 03/03/2014   Procedure: LYMPHADENECTOMY;  Surgeon: Raynelle Bring, MD;  Location: [facility] ORS;  Service: Urology;  Laterality: Bilateral;  . NASAL SEPTOPLASTY W/ TURBINOPLASTY    . PROSTATE SURGERY N/A    Phreesia 07/08/2020  . ROBOT ASSISTED LAPAROSCOPIC RADICAL PROSTATECTOMY N/A 03/03/2014   Procedure: ROBOTIC ASSISTED LAPAROSCOPIC RADICAL PROSTATECTOMY LEVEL 2;  Surgeon: Raynelle Bring, MD;  Location: [facility] ORS;  Service: Urology;  Laterality: N/A;  . TONSILLECTOMY     child  . TOOTH EXTRACTION     preparing for tooth implant  . VASECTOMY N/A    Phreesia 07/08/2020    Social History:  reports that he quit smoking about 31 years ago. His smoking use included cigarettes. He has a 7.50 pack-year smoking history. He has never used smokeless tobacco. He reports current alcohol use of about 1.0 standard drink of alcohol per week. He reports that he does not use drugs.   No Known Allergies  No family history on file.   Prior to Admission medications   Medication Sig Start Date End Date Taking? Authorizing Provider  acetaminophen (TYLENOL) 650 MG CR tablet Take 1,300 mg by mouth every 8 (eight) hours as needed for pain.   Yes [provider]  BESIVANCE 0.6 % SUSP Place 1 drop into the right eye 3 (three) times daily. 08/08/20  Yes [provider]  ciprofloxacin (CIPRO) 500 MG tablet Take 1 tablet (500 mg total) by mouth 2 (two) times daily. 08/18/20  Yes Susy Frizzle, MD  cyclobenzaprine (FLEXERIL) 10 MG tablet Take 1 tablet (10 mg total) by mouth 3 (three) times daily as needed for muscle spasms. 07/11/20  Yes Susy Frizzle, MD  diclofenac Sodium (VOLTAREN) 1 % GEL Apply 1 application topically daily as needed (pain).   Yes  [provider]  DUREZOL 0.05 % EMUL Place 1 drop into the left eye 3 (three) times daily. 08/08/20  Yes [provider]  LORazepam (ATIVAN) 1 MG tablet TAKE (1) TABLET BY MOUTH TWICE DAILY AS NEEDED FOR ANXIETY. Patient taking differently: Take 1 mg by mouth at bedtime as needed for sleep.  07/11/20  Yes Susy Frizzle, MD  metroNIDAZOLE (FLAGYL) 500 MG tablet Take 1 tablet (500 mg total) by mouth 3 (three) times daily. 08/18/20  Yes Susy Frizzle, MD  Omega-3 Fatty Acids (FISH OIL) 500 MG CAPS Take 500 mg by mouth daily.   Yes [provider]  PROLENSA 0.07 % SOLN Place 1 drop into the left eye at bedtime. 08/08/20  Yes [provider]  rosuvastatin (CRESTOR) 5 MG tablet TAKE ONE TABLET BY MOUTH ONCE DAILY. Patient taking differently: Take 5 mg by mouth daily.  08/09/20  Yes Susy Frizzle, MD  tiZANidine (ZANAFLEX) 4 MG tablet Take 1 tablet (4 mg total) by mouth every 6 (six) hours as needed (headaches). 08/17/20  Yes Susy Frizzle, MD     Objective    Physical Exam: Vitals:   08/20/20 2030 08/20/20 2100 08/20/20 2130 08/20/20 2243  BP: 127/77 132/72 (!) 144/80 129/75  Pulse: 98 98 (!) 107 93  Resp: '18 13 20 18  ' Temp:    99.5 F (37.5 C)  TempSrc:    Oral  SpO2: 95% 98% 97% 98%    General: appears to be stated age; alert, oriented Skin: warm, dry, no rash Head:  AT/ Mouth:  Oral mucosa membranes appear dry, normal dentition Neck: supple; trachea midline Heart:  RRR; did not appreciate any M/R/G Lungs: CTAB, did not appreciate any wheezes, rales, or rhonchi Abdomen: + BS; soft; mildly distended; mild generalized tenderness to palpation, most prominent over the left lower quadrant, in the absence of any associated guarding, rigidity, or rebound tenderness. Vascular: 2+ pedal pulses b/l; 2+ radial pulses b/l Extremities: no peripheral edema, no muscle wasting Neuro: strength and sensation intact in upper and lower extremities  b/l    Labs on Admission: I have personally reviewed following labs and imaging studies  CBC: Recent Labs  Lab 08/14/20 0938 08/20/20 1748  WBC 6.3 15.7*  NEUTROABS 3,862  --   HGB 15.1 15.5  HCT 46.4 46.8  MCV 91.7 91.6  PLT 172 102   Basic Metabolic Panel: Recent Labs  Lab 08/14/20 0938 08/20/20 1748  NA 137 134*  K 4.0 4.3  CL 101 99  CO2 25 26  GLUCOSE 84 110*  BUN 18 16  CREATININE 1.41* 1.38*  CALCIUM 8.7 8.4*   GFR: Estimated Creatinine Clearance: 59.5 mL/min (A) (by C-G formula based on SCr of 1.38 mg/dL (H)). Liver Function Tests: Recent Labs  Lab 08/14/20 0938 08/20/20 1748  AST 35 280*  ALT 38 297*  ALKPHOS  --  226*  BILITOT 0.7 1.0  PROT 6.7 6.4*  ALBUMIN  --  3.2*   Recent Labs  Lab 08/20/20 1748  LIPASE 27  No results for input(s): AMMONIA in the last 168 hours. Coagulation Profile: No results for input(s): INR, PROTIME in the last 168 hours. Cardiac Enzymes: No results for input(s): CKTOTAL, CKMB, CKMBINDEX, TROPONINI in the last 168 hours. BNP (last 3 results) No results for input(s): PROBNP in the last 8760 hours. HbA1C: No results for input(s): HGBA1C in the last 72 hours. CBG: No results for input(s): GLUCAP in the last 168 hours. Lipid Profile: No results for input(s): CHOL, HDL, LDLCALC, TRIG, CHOLHDL, LDLDIRECT in the last 72 hours. Thyroid Function Tests: No results for input(s): TSH, T4TOTAL, FREET4, T3FREE, THYROIDAB in the last 72 hours. Anemia Panel: No results for input(s): VITAMINB12, FOLATE, FERRITIN, TIBC, IRON, RETICCTPCT in the last 72 hours. Urine analysis:    Component Value Date/Time   COLORURINE AMBER (A) 08/20/2020 1739   APPEARANCEUR CLEAR 08/20/2020 1739   LABSPEC 1.025 08/20/2020 1739   PHURINE 5.0 08/20/2020 1739   GLUCOSEU NEGATIVE 08/20/2020 1739   HGBUR NEGATIVE 08/20/2020 1739   BILIRUBINUR NEGATIVE 08/20/2020 1739   KETONESUR 20 (A) 08/20/2020 1739   PROTEINUR NEGATIVE 08/20/2020 1739    NITRITE NEGATIVE 08/20/2020 1739   LEUKOCYTESUR TRACE (A) 08/20/2020 1739    Radiological Exams on Admission: CT ABDOMEN PELVIS W CONTRAST  Result Date: 08/20/2020 CLINICAL DATA:  Abdominal pain and fever EXAM: CT ABDOMEN AND PELVIS WITH CONTRAST TECHNIQUE: Multidetector CT imaging of the abdomen and pelvis was performed using the standard protocol following bolus administration of intravenous contrast. CONTRAST:  174m OMNIPAQUE IOHEXOL 300 MG/ML  SOLN COMPARISON:  None. FINDINGS: Lower chest: The visualized heart size within normal limits. No pericardial fluid/thickening. No hiatal hernia. The visualized portions of the lungs are clear. Hepatobiliary: The liver is normal in density without focal abnormality.The main portal vein is patent. No evidence of calcified gallstones, gallbladder wall thickening or biliary dilatation. Pancreas: Unremarkable. No pancreatic ductal dilatation or surrounding inflammatory changes. Spleen: Normal in size without focal abnormality. Adrenals/Urinary Tract: Both adrenal glands appear normal. Again noted are right-sided parapelvic cyst. The kidneys and collecting system appear normal without evidence of urinary tract calculus or hydronephrosis. Bladder is unremarkable. Stomach/Bowel: The stomach is unremarkable. There are several loops distal ileum within the left lower quadrant which appear to have hyperenhancement and surrounding mild fat stranding changes. No loculated fluid collections are seen. There is also a focal segment of sigmoid colon within the right lower pelvis, series 2, image 72 with diffuse wall thickening and surrounding fat stranding changes. In mild inflammatory changes extend within the bilateral pericolic gutters. No definite free air is seen. Moderate amount of colonic stool is present. Vascular/Lymphatic: There are no enlarged mesenteric, retroperitoneal, or pelvic lymph nodes. Scattered aortic atherosclerotic calcifications are seen without aneurysmal  dilatation. Reproductive: The patient is status post prostatectomy. Other: No evidence of abdominal wall mass or hernia. Musculoskeletal: No acute or significant osseous findings. IMPRESSION: Mild wall thickening and inflammatory changes around distal ileal loops and sigmoid colon which could represent scattered areas of enteritis and diverticulitis. No loculated fluid collections or free air. Mild inflammatory changes seen within the bilateral pericolic gutters. Aortic Atherosclerosis (ICD10-I70.0). Electronically Signed   By: BPrudencio PairM.D.   On: 08/20/2020 22:30   DG Chest Portable 1 View  Result Date: 08/20/2020 CLINICAL DATA:  Cough EXAM: PORTABLE CHEST 1 VIEW COMPARISON:  April 08, 2016 FINDINGS: There is no definite acute cardiopulmonary process. The heart size is unremarkable. There is no pneumothorax or pleural effusion. There is a rounded 1 cm density in  the left mid lung zone that appears to be new since 2017. IMPRESSION: 1. No definite acute cardiopulmonary process. 2. Rounded density in the left mid lung zone. A 4-6 week follow-up two-view chest x-ray is recommended as an outpatient to confirm stability or resolution of this finding. Electronically Signed   By: Constance Holster M.D.   On: 08/20/2020 20:36   US Abdomen Limited RUQ (LIVER/GB)  Result Date: 08/20/2020 CLINICAL DATA:  Initial evaluation for elevated LFTs. History of prostate cancer. EXAM: ULTRASOUND ABDOMEN LIMITED RIGHT UPPER QUADRANT COMPARISON:  None available. FINDINGS: Gallbladder: Layering echogenic sludge present within the gallbladder lumen. No frank shadowing stones. Gallbladder wall measures at the upper limits of normal at 3 mm. No free pericholecystic fluid. No sonographic Murphy sign elicited on exam. Common bile duct: Diameter: 2.4 mm Liver: Liver demonstrates a somewhat coarsened echotexture with nodular contour. No focal intrahepatic lesions. Portal vein is patent on color Doppler imaging with normal direction of  blood flow towards the liver. Other: Incidental note made of a right pleural effusion. IMPRESSION: 1. Gallbladder sludge without cholelithiasis or other sonographic features to suggest acute cholecystitis. 2. No biliary dilatation. 3. Coarse echotexture of the liver with nodular contour, suggesting possible cirrhosis. No focal intrahepatic lesions. 4. Right pleural effusion. Electronically Signed   By: Jeannine Boga M.D.   On: 08/20/2020 19:57    Assessment/Plan   Dwayne Young is a 68 y.o. male with medical history significant for hyperlipidemia who is admitted to [facility] on 08/20/2020 with suspected diverticulitis after presenting from home to [facility] Emergency Department complaining of abdominal pain.    Principal Problem:   Diverticulitis Active Problems:   Hyperlipidemia   Nausea & vomiting   Abdominal pain   Diarrhea   Transaminitis    #) Acute diverticulitis: Diagnosis on the basis of presenting left lower quadrant abdominal pain, subjective fever with presenting temperature elevated to 100 F and leukocytosis, with CT abdomen/pelvis showing evidence of wall thickening inflammatory changes associated with the sigmoid colon consistent with acute diverticulitis in the absence of any evidence of associated obstruction, abscess, or perforation. SIRS criteria are not met at this time for presentation to be consistent with sepsis. No evidence of associated hypotension, presenting lactic acid found to be nonelevated at 1.6. Physical exam reveals no evidence of acute peritoneal signs to suggest an acute surgical abdomen at the present time. In the context of ongoing nausea/vomiting, the patient is unable to tolerate the oral antibiotics that were prescribed for him starting 08/18/2020 as outpatient management of suspected underlying diverticulitis, as further described above. Of note, recent urinalysis was not suggestive of an underlying UTI.  Plan: Monitor for  results blood cultures x2 collected in the ED this evening. Continue IV ciprofloxacin and IV Flagyl. Lactated Ringer's at 75 cc/h. N.p.o. except for ice chips. As needed IV Zofran. As needed IV fentanyl. Repeat CBC with differential in the morning. We will also repeat CMP in the morning. Add on serum magnesium level.      #) Nausea/vomiting: Recurrent nausea resulting in at least 5-6 episodes of nonbloody, nonbilious emesis over the last 10 days, with most recent episode of vomiting occurring earlier today. Patient has been unable to tolerate p.o. in the setting, and is been unable to tolerate any of his outpatient medications, including recently prescribed antibiotics, on this basis. Given the sequence of initial nausea/vomiting, with subsequent development of diarrhea, acute gastroenteritis is in the differential, with this evening CT abdomen/pelvis showing  evidence of mild wall thickening of the ileum potentially representing enteritis. Of note, COVID-19 PCR was performed in the ED this evening, and found to be negative.  Plan: Follow for results of gastrointestinal panel ordered in the ED this evening. Lactated Ringer's at 75 cc/h. Monitor strict I's and O's and daily weights. Repeat CMP in the morning, with close attention to interval electrolytes. Add on serum magnesium level and repeat serum magnesium level in the morning. As needed IV Zofran. Will monitor for results of stool studies, as further described below. Check acute viral hepatitis panel in the setting of acute transaminitis.        #) Acute Diarrhea: The patient reports at least 2 episodes of nonbloody movements per day over the course of the last 10 days, associated with abdominal pain and preceding nausea/vomiting, raising the possibility of acute gastroenteritis given his sequential development of symptoms. C. difficile appears to be less likely given the absence of recent outpatient antibiotic use, as well as no outpatient use of  PPI, although will follow for result of C. difficile screen initiated in the ED this evening. No recent travel or known sick contacts. Given the longevity of the patient's symptoms, viral ST allergy for potential underlying gastroenteritis appears less likely, and will evaluate for bacterial sources the stool cultures. Denies any known history of inflammatory bowel disease, including no known history of Crohn's or ulcerative colitis check acute viral hepatitis panel, as above, while monitoring electrolytes as well as monitoring for results of stool studies and providing IVF's. Of note, patient's overall presentation does appear to be consistent with acute diverticulitis, as further described above, prompting use of IV antibiotics, as above.   Plan: Follow for results of stool studies ordered in the ED, including screen for C. difficile. Check stool lactoferrin and stool cultures. Will refrain from use of antimotility agents until underlying infectious source has been ruled out. Enteric precautions. Gentle IV fluids, as above. Close monitoring of ensuing electrolytes and renal function with repeat CMP in the morning. Check serum magnesium level. Repeat CBC in the morning. We will also check TSH. Monitor strict I's and O's and daily weights. Check acute viral hepatitis panel.     #) Acute transaminitis: Labs performed this evening reflect acute transaminitis relative to liver enzymes performed on 08/14/2020, in the absence of an associated overt cholestatic pattern, particularly with a nonelevated total bilirubin. Abdominal ultrasound and CT of the abdomen performed this evening did not show evidence to suggest underlying acute cholecystitis, nor do they show any evidence of biliary obstruction in the absence of common bile duct dilation or overt choledocholithiasis. Acute transaminitis may be viral in etiology, given differential associated with presenting nausea, vomiting, subsequent diarrhea, and abdominal  pain that includes acute gastroenteritis. Of note, serum acetaminophen level was checked in the ED this evening, and found to be nonelevated. Additionally, lipase found to be nonelevated this evening. We will continue aforementioned work-up and management for acute nausea, vomiting, and diarrhea, as further described above, including close monitoring for results of stool studies, in addition to checking acute viral hepatitis panel.   Plan: Work-up and management of nausea, vomiting, diarrhea, including stool studies, as further described above. Check acute viral hepatitis panel. Repeat CMP in the morning. We will also check GGT to further qualify results of mildly elevated alkaline phosphatase level. Hold home rosuvastatin for now.      #) Hyperlipidemia: On rosuvastatin as an outpatient.  Plan: We will hold home rosuvastatin for now  in the setting of presenting nausea/vomiting.     DVT prophylaxis: scd's  Code Status: Full code Family Communication: none Disposition Plan: Per Rounding Team Consults called: none  Admission status: inpatient; med-surg.   COVID-19 Vaccination status: Received initial 2 doses of Pfizer vaccine in February and March 2021 respectively, followed by booster in October 2021.    PLEASE NOTE THAT DRAGON DICTATION SOFTWARE WAS USED IN THE CONSTRUCTION OF THIS NOTE.   Rhetta Mura DO [facility] Pager [phone] From [place]   08/20/2020, 10:51 PM

## 2020-08-21 ENCOUNTER — Encounter: Payer: Self-pay | Admitting: Family Medicine

## 2020-08-21 DIAGNOSIS — R109 Unspecified abdominal pain: Secondary | ICD-10-CM

## 2020-08-21 DIAGNOSIS — R112 Nausea with vomiting, unspecified: Secondary | ICD-10-CM

## 2020-08-21 DIAGNOSIS — R7401 Elevation of levels of liver transaminase levels: Secondary | ICD-10-CM

## 2020-08-21 DIAGNOSIS — R197 Diarrhea, unspecified: Secondary | ICD-10-CM

## 2020-08-21 DIAGNOSIS — K5792 Diverticulitis of intestine, part unspecified, without perforation or abscess without bleeding: Secondary | ICD-10-CM | POA: Diagnosis not present

## 2020-08-21 LAB — CBC WITH DIFFERENTIAL/PLATELET
Abs Immature Granulocytes: 0.08 10*3/uL — ABNORMAL HIGH (ref 0.00–0.07)
Basophils Absolute: 0.2 10*3/uL — ABNORMAL HIGH (ref 0.0–0.1)
Basophils Relative: 1 %
Eosinophils Absolute: 0.1 10*3/uL (ref 0.0–0.5)
Eosinophils Relative: 0 %
HCT: 39.4 % (ref 39.0–52.0)
Hemoglobin: 12.8 g/dL — ABNORMAL LOW (ref 13.0–17.0)
Immature Granulocytes: 1 %
Lymphocytes Relative: 59 %
Lymphs Abs: 10 10*3/uL — ABNORMAL HIGH (ref 0.7–4.0)
MCH: 30.1 pg (ref 26.0–34.0)
MCHC: 32.5 g/dL (ref 30.0–36.0)
MCV: 92.7 fL (ref 80.0–100.0)
Monocytes Absolute: 1.6 10*3/uL — ABNORMAL HIGH (ref 0.1–1.0)
Monocytes Relative: 10 %
Neutro Abs: 4.8 10*3/uL (ref 1.7–7.7)
Neutrophils Relative %: 29 %
Platelets: 163 10*3/uL (ref 150–400)
RBC: 4.25 MIL/uL (ref 4.22–5.81)
RDW: 13.7 % (ref 11.5–15.5)
WBC: 16.7 10*3/uL — ABNORMAL HIGH (ref 4.0–10.5)
nRBC: 0 % (ref 0.0–0.2)

## 2020-08-21 LAB — COMPREHENSIVE METABOLIC PANEL
ALT: 238 U/L — ABNORMAL HIGH (ref 0–44)
AST: 254 U/L — ABNORMAL HIGH (ref 15–41)
Albumin: 2.6 g/dL — ABNORMAL LOW (ref 3.5–5.0)
Alkaline Phosphatase: 186 U/L — ABNORMAL HIGH (ref 38–126)
Anion gap: 9 (ref 5–15)
BUN: 16 mg/dL (ref 8–23)
CO2: 22 mmol/L (ref 22–32)
Calcium: 7.8 mg/dL — ABNORMAL LOW (ref 8.9–10.3)
Chloride: 102 mmol/L (ref 98–111)
Creatinine, Ser: 1.25 mg/dL — ABNORMAL HIGH (ref 0.61–1.24)
GFR, Estimated: >60 mL/min (ref >60)
Glucose, Bld: 105 mg/dL — ABNORMAL HIGH (ref 70–99)
Potassium: 4.7 mmol/L (ref 3.5–5.1)
Sodium: 133 mmol/L — ABNORMAL LOW (ref 135–145)
Total Bilirubin: 0.9 mg/dL (ref 0.3–1.2)
Total Protein: 5.1 g/dL — ABNORMAL LOW (ref 6.5–8.1)

## 2020-08-21 LAB — PROTIME-INR
INR: 1 (ref 0.8–1.2)
Prothrombin Time: 13.1 seconds (ref 11.4–15.2)

## 2020-08-21 LAB — HEPATITIS PANEL, ACUTE
HCV Ab: NONREACTIVE
Hep A IgM: NONREACTIVE
Hep B C IgM: NONREACTIVE
Hepatitis B Surface Ag: NONREACTIVE

## 2020-08-21 LAB — MAGNESIUM
Magnesium: 1.9 mg/dL (ref 1.7–2.4)
Magnesium: 2 mg/dL (ref 1.7–2.4)

## 2020-08-21 LAB — HIV ANTIBODY (ROUTINE TESTING W REFLEX): HIV Screen 4th Generation wRfx: NONREACTIVE

## 2020-08-21 LAB — GAMMA GT: GGT: 117 U/L — ABNORMAL HIGH (ref 7–50)

## 2020-08-21 LAB — TSH: TSH: 2.766 u[IU]/mL (ref 0.350–4.500)

## 2020-08-21 MED ORDER — CYCLOBENZAPRINE HCL 10 MG PO TABS
10.0000 mg | ORAL_TABLET | Freq: Once | ORAL | Status: AC
  Administered 2020-08-21: 10 mg via ORAL
  Filled 2020-08-21: qty 1

## 2020-08-21 MED ORDER — SIMETHICONE 40 MG/0.6ML PO SUSP
40.0000 mg | Freq: Four times a day (QID) | ORAL | Status: DC | PRN
  Administered 2020-08-21 – 2020-08-24 (×7): 40 mg via ORAL
  Filled 2020-08-21 (×10): qty 0.6

## 2020-08-21 MED ORDER — OXYCODONE HCL 5 MG PO TABS
5.0000 mg | ORAL_TABLET | Freq: Four times a day (QID) | ORAL | Status: DC | PRN
  Administered 2020-08-21 (×2): 5 mg via ORAL
  Filled 2020-08-21 (×2): qty 1

## 2020-08-21 NOTE — Progress Notes (Signed)
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 Today he reports diarrhea has been resolved.  Continues to be nauseous.  His p.o. intake has been decreased due to the above symptoms.  His last bowel movement was on Sunday yesterday. He describes his headache as bilateral temporal headaches no associated changes with vision.  Assessment & Plan:   Principal Problem:   Diverticulitis Active Problems:   Hyperlipidemia   Nausea & vomiting   Abdominal pain   Diarrhea   Transaminitis   #1 acute diverticulitis/enteritis patient presented with abdominal pain fever nausea vomiting and diarrhea.  CT abdomen pelvis consistent with enteritis and diverticulitis.  Lactic acid on admission 1.6.  Patient denies any further vomiting since coming to the hospital his main complaint is abdominal distention.  Patient could not tolerate outpatient antibiotics as he continued to have nausea and vomiting.  I will continue Cipro and Flagyl and IV fluids.  We will try him on clear liquids today. Patient has not had any diarrhea since coming to the hospital.  #2 possible viral meningitis CBC with lymphocytosis.  He came in with complaints of fever and neck pain and neck stiffness  For the last 10 days.  Discussed with him options of doing a lumbar puncture he deferred.  Clinically he appears to be stable to have bacterial meningitis.  We will treat him symptomatically.  #3 abnormal elevated LFTs likely viral induced.  Follow-up in a.m.  Hold Motrin Tylenol rosuvastatin.  Estimated body mass index is 25.12 kg/m as calculated from the following:   Height as of 08/14/20: 6\' 3"  (1.905 m).   Weight as of 08/14/20: 91.2 kg.  DVT prophylaxis: Lovenox  code Status: Full code Family  Communication: None at bedside Disposition Plan:  Status is: Inpatient  Dispo: The patient is from: Home              Anticipated d/c is to: Home              Anticipated d/c date is: 3 days              Patient currently is not medically stable to d/c.    Consultants:   None  Procedures: None Antimicrobials: Cipro and Flagyl Subjective: Patient resting in bed awake and alert No bowel movements since coming to the hospital nausea better abdomen still distended abdominal pain better  Objective: Vitals:   08/21/20 0430 08/21/20 0500 08/21/20 0619 08/21/20 0700  BP: 126/68 129/75 118/63 111/62  Pulse: 79 84 86 87  Resp: 20 17 (!) 21 (!) 21  Temp:      TempSrc:      SpO2: 94% 93% 92% 90%   No intake or output data in the 24 hours ending 08/21/20 0841 There were no vitals filed for this visit.  Examination:  General exam: Appears calm and comfortable  Respiratory system: Clear to auscultation. Respiratory effort normal. Cardiovascular system: S1 & S2 heard, RRR. No JVD, murmurs, rubs, gallops or clicks. No pedal edema. Gastrointestinal system: Abdomen is distended, soft and mild tender. No organomegaly or masses felt. Normal bowel sounds heard. Central nervous system: Alert and oriented. No focal neurological deficits. Extremities: Symmetric 5 x 5 power. Skin: No rashes, lesions or ulcers Psychiatry: Judgement and insight appear normal. Mood &  affect appropriate.     Data Reviewed: I have personally reviewed following labs and imaging studies  CBC: Recent Labs  Lab 08/20/20 1748 08/21/20 0415  WBC 15.7* 16.7*  NEUTROABS  --  4.8  HGB 15.5 12.8*  HCT 46.8 39.4  MCV 91.6 92.7  PLT 199 614   Basic Metabolic Panel: Recent Labs  Lab 08/20/20 1748 08/21/20 0415  NA 134* 133*  K 4.3 4.7  CL 99 102  CO2 26 22  GLUCOSE 110* 105*  BUN 16 16  CREATININE 1.38* 1.25*  CALCIUM 8.4* 7.8*  MG  --  1.9   GFR: Estimated Creatinine Clearance: 65.7 mL/min (A) (by C-G  formula based on SCr of 1.25 mg/dL (H)). Liver Function Tests: Recent Labs  Lab 08/20/20 1748 08/21/20 0415  AST 280* 254*  ALT 297* 238*  ALKPHOS 226* 186*  BILITOT 1.0 0.9  PROT 6.4* 5.1*  ALBUMIN 3.2* 2.6*   Recent Labs  Lab 08/20/20 1748  LIPASE 27   No results for input(s): AMMONIA in the last 168 hours. Coagulation Profile: Recent Labs  Lab 08/21/20 0415  INR 1.0   Cardiac Enzymes: No results for input(s): CKTOTAL, CKMB, CKMBINDEX, TROPONINI in the last 168 hours. BNP (last 3 results) No results for input(s): PROBNP in the last 8760 hours. HbA1C: No results for input(s): HGBA1C in the last 72 hours. CBG: No results for input(s): GLUCAP in the last 168 hours. Lipid Profile: No results for input(s): CHOL, HDL, LDLCALC, TRIG, CHOLHDL, LDLDIRECT in the last 72 hours. Thyroid Function Tests: Recent Labs    08/21/20 0415  TSH 2.766   Anemia Panel: No results for input(s): VITAMINB12, FOLATE, FERRITIN, TIBC, IRON, RETICCTPCT in the last 72 hours. Sepsis Labs: Recent Labs  Lab 08/20/20 2005  LATICACIDVEN 1.6    Recent Results (from the past 240 hour(s))  Culture, blood (single) w Reflex to ID Panel     Status: None   Collection Time: 08/14/20  9:38 AM   Specimen: Blood  Result Value Ref Range Status   MICRO NUMBER: [id number]  Final   SPECIMEN QUALITY: Suboptimal  Final   Source NOT GIVEN  Final   STATUS: FINAL  Final   Result:   Final    No growth after 5 days Inspection of blood culture bottles indicates that an inadequate volume of blood may have been collected for the detection of sepsis.   COMMENT: Aerobic and anaerobic bottle received.  Final  Blood culture (routine x 2)     Status: None (Preliminary result)   Collection Time: 08/20/20  7:50 PM   Specimen: BLOOD  Result Value Ref Range Status   Specimen Description   Final    BLOOD LEFT ANTECUBITAL Performed at [facility] [address], [place], [place] [address]     Special Requests   Final    BOTTLES DRAWN AEROBIC AND ANAEROBIC Blood Culture adequate volume Performed at [facility] [address], [place], [place] [address]    Culture   Final    NO GROWTH < 12 HOURS Performed at [facility] [address], [place], [place] [address]    Report Status PENDING  Incomplete  Blood culture (routine x 2)     Status: None (Preliminary result)   Collection Time: 08/20/20  8:05 PM   Specimen: BLOOD  Result Value Ref Range Status   Specimen Description   Final    BLOOD RIGHT ANTECUBITAL Performed at [facility],  [place] [address], [place], [place] [address]    Special Requests   Final    BOTTLES DRAWN AEROBIC AND ANAEROBIC Blood Culture adequate volume Performed at [facility] [address], [place], [place] [address]    Culture   Final    NO GROWTH < 12 HOURS Performed at [facility] [address], [place], [place] [address]    Report Status PENDING  Incomplete  Respiratory Panel by RT PCR (Flu A&B, Covid) - Nasopharyngeal Swab     Status: None   Collection Time: 08/20/20  8:13 PM   Specimen: Nasopharyngeal Swab  Result Value Ref Range Status   SARS Coronavirus 2 by RT PCR NEGATIVE NEGATIVE Final    Comment: (NOTE) SARS-CoV-2 target nucleic acids are NOT DETECTED.  The SARS-CoV-2 RNA is generally detectable in upper respiratoy specimens during the acute phase of infection. The lowest concentration of SARS-CoV-2 viral copies this assay can detect is 131 copies/mL. A negative result does not preclude SARS-Cov-2 infection and should not be used as the sole basis for treatment or other patient management decisions. A negative result may occur with  improper specimen collection/handling, submission of specimen other than nasopharyngeal swab, presence of viral mutation(s) within the areas targeted by this assay, and inadequate number of viral copies (<131 copies/mL). A  negative result must be combined with clinical observations, patient history, and epidemiological information. The expected result is Negative.  Fact Sheet for Patients:  PinkCheek.be  Fact Sheet for Healthcare Providers:  GravelBags.it  This test is no t yet approved or cleared by the [place] FDA and  has been authorized for detection and/or diagnosis of SARS-CoV-2 by FDA under an Emergency Use Authorization (EUA). This EUA will remain  in effect (meaning this test can be used) for the duration of the COVID-19 declaration under Section 564(b)(1) of the Act, 21 U.S.C. section 360bbb-3(b)(1), unless the authorization is terminated or revoked sooner.     Influenza A by PCR NEGATIVE NEGATIVE Final   Influenza B by PCR NEGATIVE NEGATIVE Final    Comment: (NOTE) The Xpert Xpress SARS-CoV-2/FLU/RSV assay is intended as an aid in  the diagnosis of influenza from Nasopharyngeal swab specimens and  should not be used as a sole basis for treatment. Nasal washings and  aspirates are unacceptable for Xpert Xpress SARS-CoV-2/FLU/RSV  testing.  Fact Sheet for Patients: PinkCheek.be  Fact Sheet for Healthcare Providers: GravelBags.it  This test is not yet approved or cleared by the [place] FDA and  has been authorized for detection and/or diagnosis of SARS-CoV-2 by  FDA under an Emergency Use Authorization (EUA). This EUA will remain  in effect (meaning this test can be used) for the duration of the  Covid-19 declaration under Section 564(b)(1) of the Act, 21  U.S.C. section 360bbb-3(b)(1), unless the authorization is  terminated or revoked. Performed at [facility], [place] [address], [place], [place] [address]          Radiology Studies: CT ABDOMEN PELVIS W CONTRAST  Result Date: 08/20/2020 CLINICAL DATA:  Abdominal pain and fever EXAM:  CT ABDOMEN AND PELVIS WITH CONTRAST TECHNIQUE: Multidetector CT imaging of the abdomen and pelvis was performed using the standard protocol following bolus administration of intravenous contrast. CONTRAST:  152mL OMNIPAQUE IOHEXOL 300 MG/ML  SOLN COMPARISON:  None. FINDINGS: Lower chest: The visualized heart size within normal limits. No pericardial fluid/thickening. No hiatal hernia. The visualized portions of the lungs are clear. Hepatobiliary: The liver is  normal in density without focal abnormality.The main portal vein is patent. No evidence of calcified gallstones, gallbladder wall thickening or biliary dilatation. Pancreas: Unremarkable. No pancreatic ductal dilatation or surrounding inflammatory changes. Spleen: Normal in size without focal abnormality. Adrenals/Urinary Tract: Both adrenal glands appear normal. Again noted are right-sided parapelvic cyst. The kidneys and collecting system appear normal without evidence of urinary tract calculus or hydronephrosis. Bladder is unremarkable. Stomach/Bowel: The stomach is unremarkable. There are several loops distal ileum within the left lower quadrant which appear to have hyperenhancement and surrounding mild fat stranding changes. No loculated fluid collections are seen. There is also a focal segment of sigmoid colon within the right lower pelvis, series 2, image 72 with diffuse wall thickening and surrounding fat stranding changes. In mild inflammatory changes extend within the bilateral pericolic gutters. No definite free air is seen. Moderate amount of colonic stool is present. Vascular/Lymphatic: There are no enlarged mesenteric, retroperitoneal, or pelvic lymph nodes. Scattered aortic atherosclerotic calcifications are seen without aneurysmal dilatation. Reproductive: The patient is status post prostatectomy. Other: No evidence of abdominal wall mass or hernia. Musculoskeletal: No acute or significant osseous findings. IMPRESSION: Mild wall thickening and  inflammatory changes around distal ileal loops and sigmoid colon which could represent scattered areas of enteritis and diverticulitis. No loculated fluid collections or free air. Mild inflammatory changes seen within the bilateral pericolic gutters. Aortic Atherosclerosis (ICD10-I70.0). Electronically Signed   By: Prudencio Pair M.D.   On: 08/20/2020 22:30   DG Chest Portable 1 View  Result Date: 08/20/2020 CLINICAL DATA:  Cough EXAM: PORTABLE CHEST 1 VIEW COMPARISON:  April 08, 2016 FINDINGS: There is no definite acute cardiopulmonary process. The heart size is unremarkable. There is no pneumothorax or pleural effusion. There is a rounded 1 cm density in the left mid lung zone that appears to be new since 2017. IMPRESSION: 1. No definite acute cardiopulmonary process. 2. Rounded density in the left mid lung zone. A 4-6 week follow-up two-view chest x-ray is recommended as an outpatient to confirm stability or resolution of this finding. Electronically Signed   By: Constance Holster M.D.   On: 08/20/2020 20:36   US Abdomen Limited RUQ (LIVER/GB)  Result Date: 08/20/2020 CLINICAL DATA:  Initial evaluation for elevated LFTs. History of prostate cancer. EXAM: ULTRASOUND ABDOMEN LIMITED RIGHT UPPER QUADRANT COMPARISON:  None available. FINDINGS: Gallbladder: Layering echogenic sludge present within the gallbladder lumen. No frank shadowing stones. Gallbladder wall measures at the upper limits of normal at 3 mm. No free pericholecystic fluid. No sonographic Murphy sign elicited on exam. Common bile duct: Diameter: 2.4 mm Liver: Liver demonstrates a somewhat coarsened echotexture with nodular contour. No focal intrahepatic lesions. Portal vein is patent on color Doppler imaging with normal direction of blood flow towards the liver. Other: Incidental note made of a right pleural effusion. IMPRESSION: 1. Gallbladder sludge without cholelithiasis or other sonographic features to suggest acute cholecystitis. 2. No  biliary dilatation. 3. Coarse echotexture of the liver with nodular contour, suggesting possible cirrhosis. No focal intrahepatic lesions. 4. Right pleural effusion. Electronically Signed   By: Jeannine Boga M.D.   On: 08/20/2020 19:57        Scheduled Meds: . Difluprednate  1 drop Left Eye TID  . gatifloxacin  1 drop Right Eye TID  . ketorolac  1 drop Left Eye QHS   Continuous Infusions: . ciprofloxacin    . lactated ringers 75 mL/hr at 08/21/20 0041  . metronidazole 500 mg (08/21/20 0540)  LOS: 1 day   Georgette Shell, MD 08/21/2020, 8:41 AM

## 2020-08-22 ENCOUNTER — Inpatient Hospital Stay (HOSPITAL_COMMUNITY): Payer: Medicare PPO

## 2020-08-22 DIAGNOSIS — R1032 Left lower quadrant pain: Secondary | ICD-10-CM

## 2020-08-22 DIAGNOSIS — K5792 Diverticulitis of intestine, part unspecified, without perforation or abscess without bleeding: Secondary | ICD-10-CM | POA: Diagnosis not present

## 2020-08-22 DIAGNOSIS — R7989 Other specified abnormal findings of blood chemistry: Secondary | ICD-10-CM

## 2020-08-22 DIAGNOSIS — R933 Abnormal findings on diagnostic imaging of other parts of digestive tract: Secondary | ICD-10-CM

## 2020-08-22 LAB — COMPREHENSIVE METABOLIC PANEL
ALT: 319 U/L — ABNORMAL HIGH (ref 0–44)
AST: 415 U/L — ABNORMAL HIGH (ref 15–41)
Albumin: 2.4 g/dL — ABNORMAL LOW (ref 3.5–5.0)
Alkaline Phosphatase: 171 U/L — ABNORMAL HIGH (ref 38–126)
Anion gap: 6 (ref 5–15)
BUN: 15 mg/dL (ref 8–23)
CO2: 23 mmol/L (ref 22–32)
Calcium: 7.6 mg/dL — ABNORMAL LOW (ref 8.9–10.3)
Chloride: 102 mmol/L (ref 98–111)
Creatinine, Ser: 1.25 mg/dL — ABNORMAL HIGH (ref 0.61–1.24)
GFR, Estimated: >60 mL/min (ref >60)
Glucose, Bld: 109 mg/dL — ABNORMAL HIGH (ref 70–99)
Potassium: 4.3 mmol/L (ref 3.5–5.1)
Sodium: 131 mmol/L — ABNORMAL LOW (ref 135–145)
Total Bilirubin: 0.6 mg/dL (ref 0.3–1.2)
Total Protein: 5.1 g/dL — ABNORMAL LOW (ref 6.5–8.1)

## 2020-08-22 LAB — CBC
HCT: 40.8 % (ref 39.0–52.0)
Hemoglobin: 13.3 g/dL (ref 13.0–17.0)
MCH: 30.5 pg (ref 26.0–34.0)
MCHC: 32.6 g/dL (ref 30.0–36.0)
MCV: 93.6 fL (ref 80.0–100.0)
Platelets: 169 10*3/uL (ref 150–400)
RBC: 4.36 MIL/uL (ref 4.22–5.81)
RDW: 13.9 % (ref 11.5–15.5)
WBC: 16.2 10*3/uL — ABNORMAL HIGH (ref 4.0–10.5)
nRBC: 0 % (ref 0.0–0.2)

## 2020-08-22 MED ORDER — HYDROMORPHONE HCL 1 MG/ML IJ SOLN
0.5000 mg | Freq: Four times a day (QID) | INTRAMUSCULAR | Status: DC | PRN
  Administered 2020-08-22 – 2020-08-23 (×2): 0.5 mg via INTRAVENOUS
  Filled 2020-08-22 (×3): qty 0.5

## 2020-08-22 MED ORDER — BISACODYL 10 MG RE SUPP
10.0000 mg | Freq: Every day | RECTAL | Status: DC | PRN
  Administered 2020-08-23: 10 mg via RECTAL
  Filled 2020-08-22: qty 1

## 2020-08-22 MED ORDER — BISACODYL 10 MG RE SUPP
10.0000 mg | Freq: Once | RECTAL | Status: AC
  Administered 2020-08-22: 10 mg via RECTAL
  Filled 2020-08-22: qty 1

## 2020-08-22 NOTE — Consult Note (Addendum)
Consultation  Referring Provider:   Dr. Zigmund Daniel Primary Care Physician:  Susy Frizzle, MD Primary Gastroenterologist: Dr. Arnoldo Morale   (unassigned here)      Reason for Consultation:    Elevated LFTs         HPI:   Dwayne Young is a 69 y.o. male with a past medical history as listed below including prostate cancer, who was admitted to the hospital on 08/20/2020 for abdominal pain.    Today, the patient explains that he has been having sharp nonradiating abdominal discomfort all over his abdomen for the past 10 to 11 days.  This seems somewhat worse in the left lower quadrant.  Pain is described as intermittent and worsening with movement as well as palpation of the abdomen.  Associated symptoms include nausea and 5-6 episodes of nonbloody, nonbilious emesis.  Describes a decrease in appetite recently with vomiting and inability to take oral medications as an outpatient at the past 2 to 3 days.  Also describes over the past 1 to 2 days he has developed loose stool, but has not had any further over the past 24 hours since admission.  Also concerning to the patient are severe headaches.  He tells me he had cataract surgery 2 weeks ago on Monday and all of the symptoms started the day afterwards.  He is having severe headaches as well as alternating between fever and chills.  Tells me he was using Tylenol 6-8 tabs per day for 2 weeks due to this fever and pain.  Tells me his PCP question of CMV.  Denies previous elevation of liver enzymes or family history of liver disease.    Also patient reports that bowel movements are typically like clockwork and he has not had one now since Saturday.  Does tell me he has been passing some gas.    Tells me he is typically a fairly healthy person and pretty active hiking at least a couple of miles a day.    Denies blood in the stool or alcohol use.  ED course: Temp max 100.0, heart rate 107, albumin 3.2, alk phos 226 (91 on 08/14/2020), AST 280 (35 on  08/14/2020), ALT 297 (38 on 08/14/2020), total bilirubin 1 (0.7 on 08/14/2020), lipase normal at 27, normal serum acetaminophen level, white count of 15,700, Covid negative, GI panel ordered as well as C. difficile testing; abdominal ultrasound showed gallbladder sludge without cholelithiasis and without evidence of acute cholecystitis; CT abdomen pelvis with contrast showed mild wall thickening inflammatory changes associated with the sigmoid colon potentially representing acute diverticulitis as well as mild wall thickening of the distal ileum potentially representing enteritis in the absence of any evidence of bowel obstruction, abscess or perforation  GI history: 04/27/2013 colonoscopy done for history of tubular adenoma by Dr. Arnoldo Morale; normal, repeat recommended in 10 years 2010 pathology shows tubular adenoma  Past Medical History:  Diagnosis Date  . Arthritis    arthritis-generalized-knees, elbows  . Elevated PSA   . Hypercholesteremia   . Hyperlipidemia   . Prostate cancer ([place])    dx. prostate cancer after bx. 2'15-now surgery planned  . Retinal detachment   . Seasonal allergies     Past Surgical History:  Procedure Laterality Date  . COLONOSCOPY N/A 04/27/2013   Procedure: COLONOSCOPY;  Surgeon: Jamesetta So, MD;  Location: [facility];  Service: Gastroenterology;  Laterality: N/A;  . GANGLION CYST EXCISION Right   . KNEE ARTHROSCOPY Bilateral    open procedure  .  LYMPHADENECTOMY Bilateral 03/03/2014   Procedure: LYMPHADENECTOMY;  Surgeon: Raynelle Bring, MD;  Location: [facility];  Service: Urology;  Laterality: Bilateral;  . NASAL SEPTOPLASTY W/ TURBINOPLASTY    . PROSTATE SURGERY N/A    [employer]reesia 07/08/2020  . ROBOT ASSISTED LAPAROSCOPIC RADICAL PROSTATECTOMY N/A 03/03/2014   Procedure: ROBOTIC ASSISTED LAPAROSCOPIC RADICAL PROSTATECTOMY LEVEL 2;  Surgeon: Raynelle Bring, MD;  Location: [facility];  Service: Urology;  Laterality: N/A;  . TONSILLECTOMY     child  . TOOTH EXTRACTION      preparing for tooth implant  . VASECTOMY N/A    [employer]rees[employer] 07/08/2020    Family history: No family history of liver disease or autoimmune disorders  Social History   Tobacco Use  . Smoking status: Former Smoker    Packs/day: 0.50    Years: 15.00    Pack years: 7.50    Types: Cigarettes    Quit date: 04/27/1989    Years since quitting: 31.3  . Smokeless tobacco: Never Used  Substance Use Topics  . Alcohol use: Yes    Alcohol/week: 1.0 standard drink    Types: 1 Glasses of wine per week    Comment: moderate - wine every 3 rd day  . Drug use: No    Prior to Admission medications   Medication Sig Start Date End Date Taking? Authorizing Provider  acetaminophen (TYLENOL) 650 MG CR tablet Take 1,300 mg by mouth every 8 (eight) hours as needed for pain.   Yes [provider]  BESIVANCE 0.6 % SUSP Place 1 drop into the right eye 3 (three) times daily. 08/08/20  Yes [provider]  ciprofloxacin (CIPRO) 500 MG tablet Take 1 tablet (500 mg total) by mouth 2 (two) times daily. 08/18/20  Yes Susy Frizzle, MD  cyclobenzaprine (FLEXERIL) 10 MG tablet Take 1 tablet (10 mg total) by mouth 3 (three) times daily as needed for muscle spasms. 07/11/20  Yes Susy Frizzle, MD  diclofenac Sodium (VOLTAREN) 1 % GEL Apply 1 application topically daily as needed (pain).   Yes [provider]  DUREZOL 0.05 % EMUL Place 1 drop into the left eye 3 (three) times daily. 08/08/20  Yes [provider]  LORazepam (ATIVAN) 1 MG tablet TAKE (1) TABLET BY MOUTH TWICE DAILY AS NEEDED FOR ANXIETY. Patient taking differently: Take 1 mg by mouth at bedtime as needed for sleep.  07/11/20  Yes Susy Frizzle, MD  metroNIDAZOLE (FLAGYL) 500 MG tablet Take 1 tablet (500 mg total) by mouth 3 (three) times daily. 08/18/20  Yes Susy Frizzle, MD  Omega-3 Fatty Acids (FISH OIL) 500 MG CAPS Take 500 mg by mouth daily.   Yes [provider]  PROLENSA 0.07 % SOLN Place 1  drop into the left eye at bedtime. 08/08/20  Yes [provider]  rosuvastatin (CRESTOR) 5 MG tablet TAKE ONE TABLET BY MOUTH ONCE DAILY. Patient taking differently: Take 5 mg by mouth daily.  08/09/20  Yes Susy Frizzle, MD  tiZANidine (ZANAFLEX) 4 MG tablet Take 1 tablet (4 mg total) by mouth every 6 (six) hours as needed (headaches). 08/17/20  Yes Susy Frizzle, MD    Current Facility-Administered Medications  Medication Dose Route Frequency Provider Last Rate Last Admin  . ciprofloxacin (CIPRO) IVPB 400 mg  400 mg Intravenous Q12H Howerter, Justin B, DO 200 mL/hr at 08/21/20 2131 400 mg at 08/21/20 2131  . Difluprednate 0.05 % EMUL 1 drop  1 drop Left Eye TID Howerter, Ethelda Chick,  DO      . fentaNYL (SUBLIMAZE) injection 25 mcg  25 mcg Intravenous Q2H PRN Howerter, Justin B, DO   25 mcg at 08/22/20 0026  . gatifloxacin (ZYMAXID) 0.5 % ophthalmic drops 1 drop  1 drop Right Eye TID Howerter, Justin B, DO   1 drop at 08/21/20 0041  . ketorolac (ACULAR) 0.5 % ophthalmic solution 1 drop  1 drop Left Eye QHS Howerter, Justin B, DO   1 drop at 08/21/20 0041  . lactated ringers infusion   Intravenous Continuous Howerter, Justin B, DO 75 mL/hr at 08/21/20 2003 New Bag at 08/21/20 2003  . metroNIDAZOLE (FLAGYL) IVPB 500 mg  500 mg Intravenous Q8H Howerter, Justin B, DO 100 mL/hr at 08/22/20 0524 500 mg at 08/22/20 0524  . ondansetron (ZOFRAN) injection 4 mg  4 mg Intravenous Q6H PRN Howerter, Justin B, DO   4 mg at 08/22/20 0026  . oxyCODONE (Oxy IR/ROXICODONE) immediate release tablet 5 mg  5 mg Oral Q6H PRN Georgette Shell, MD   5 mg at 08/21/20 2130  . simethicone (MYLICON) 40 IO/9.7DZ suspension 40 mg  40 mg Oral Q6H PRN Georgette Shell, MD   40 mg at 08/21/20 2216    Allergies as of 08/20/2020  . (No Known Allergies)     Review of Systems:    Constitutional: No weight loss, fever or chills Skin: No rash or itching Cardiovascular: No chest pain  Respiratory: No  SOB Gastrointestinal: See HPI and otherwise negative Genitourinary: No dysuria Neurological: No dizziness or syncope Musculoskeletal: No new muscle or joint pain Hematologic: No bleeding  Psychiatric: No history of depression or anxiety    Physical Exam:  Vital signs in last 24 hours: Temp:  [97.6 F (36.4 C)-99.4 F (37.4 C)] 99.1 F (37.3 C) (11/09 0641) Pulse Rate:  [80-102] 96 (11/09 0641) Resp:  [13-20] 18 (11/09 0641) BP: (113-144)/(59-93) 128/69 (11/09 0641) SpO2:  [92 %-99 %] 93 % (11/09 0641) Weight:  [91.1 kg] 91.1 kg (11/08 1202) Last BM Date: 08/20/20 General:   Pleasant Caucasian male appears to be in NAD, Well developed, Well nourished, alert and cooperative Head:  Normocephalic and atraumatic. Eyes:   PEERL, EOMI. No icterus. Conjunctiva pink. Ears:  Normal auditory acuity. Neck:  Supple Throat: Oral cavity and pharynx without inflammation, swelling or lesion Lungs: Respirations even and unlabored. Lungs clear to auscultation bilaterally.   No wheezes, crackles, or rhonchi.  Heart: Normal S1, S2. No MRG. Regular rate and rhythm. No peripheral edema, cyanosis or pallor.  Abdomen:  Soft, mild distension, mild generalized ttp, hypertympanic over LUQ. No rebound or guarding. Decreased BS. No appreciable masses or hepatomegaly. Rectal:  Not performed.  Msk:  Symmetrical without gross deformities. Peripheral pulses intact.  Extremities:  Without edema, no deformity or joint abnormality.  Neurologic:  Alert and  oriented x4;  grossly normal neurologically.  Skin:   Dry and intact without significant lesions or rashes. Psychiatric: Demonstrates good judgement and reason without abnormal affect or behaviors.   LAB RESULTS: Recent Labs    08/20/20 1748 08/21/20 0415 08/22/20 0542  WBC 15.7* 16.7* 16.2*  HGB 15.5 12.8* 13.3  HCT 46.8 39.4 40.8  PLT 199 163 169   BMET Recent Labs    08/20/20 1748 08/21/20 0415 08/22/20 0542  NA 134* 133* 131*  K 4.3 4.7 4.3   CL 99 102 102  CO2 '26 22 23  ' GLUCOSE 110* 105* 109*  BUN '16 16 15  ' CREATININE 1.38* 1.25*  1.25*  CALCIUM 8.4* 7.8* 7.6*   Hepatic Function Latest Ref Rng & Units 08/22/2020 08/21/2020 08/20/2020  Total Protein 6.5 - 8.1 g/dL 5.1(L) 5.1(L) 6.4(L)  Albumin 3.5 - 5.0 g/dL 2.4(L) 2.6(L) 3.2(L)  AST 15 - 41 U/L 415(H) 254(H) 280(H)  ALT 0 - 44 U/L 319(H) 238(H) 297(H)  Alk Phosphatase 38 - 126 U/L 171(H) 186(H) 226(H)  Total Bilirubin 0.3 - 1.2 mg/dL 0.6 0.9 1.0  Bilirubin, Direct 0.0 - 0.3 mg/dL - - -   PT/INR Recent Labs    08/21/20 0415  LABPROT 13.1  INR 1.0    STUDIES: CT ABDOMEN PELVIS W CONTRAST  Result Date: 08/20/2020 CLINICAL DATA:  Abdominal pain and fever EXAM: CT ABDOMEN AND PELVIS WITH CONTRAST TECHNIQUE: Multidetector CT imaging of the abdomen and pelvis was performed using the standard protocol following bolus administration of intravenous contrast. CONTRAST:  134m OMNIPAQUE IOHEXOL 300 MG/ML  SOLN COMPARISON:  None. FINDINGS: Lower chest: The visualized heart size within normal limits. No pericardial fluid/thickening. No hiatal hernia. The visualized portions of the lungs are clear. Hepatobiliary: The liver is normal in density without focal abnormality.The main portal vein is patent. No evidence of calcified gallstones, gallbladder wall thickening or biliary dilatation. Pancreas: Unremarkable. No pancreatic ductal dilatation or surrounding inflammatory changes. Spleen: Normal in size without focal abnormality. Adrenals/Urinary Tract: Both adrenal glands appear normal. Again noted are right-sided parapelvic cyst. The kidneys and collecting system appear normal without evidence of urinary tract calculus or hydronephrosis. Bladder is unremarkable. Stomach/Bowel: The stomach is unremarkable. There are several loops distal ileum within the left lower quadrant which appear to have hyperenhancement and surrounding mild fat stranding changes. No loculated fluid collections are seen.  There is also a focal segment of sigmoid colon within the right lower pelvis, series 2, image 72 with diffuse wall thickening and surrounding fat stranding changes. In mild inflammatory changes extend within the bilateral pericolic gutters. No definite free air is seen. Moderate amount of colonic stool is present. Vascular/Lymphatic: There are no enlarged mesenteric, retroperitoneal, or pelvic lymph nodes. Scattered aortic atherosclerotic calcifications are seen without aneurysmal dilatation. Reproductive: The patient is status post prostatectomy. Other: No evidence of abdominal wall mass or hernia. Musculoskeletal: No acute or significant osseous findings. IMPRESSION: Mild wall thickening and inflammatory changes around distal ileal loops and sigmoid colon which could represent scattered areas of enteritis and diverticulitis. No loculated fluid collections or free air. Mild inflammatory changes seen within the bilateral pericolic gutters. Aortic Atherosclerosis (ICD10-I70.0). Electronically Signed   By: BPrudencio PairM.D.   On: 08/20/2020 22:30   DG Chest Portable 1 View  Result Date: 08/20/2020 CLINICAL DATA:  Cough EXAM: PORTABLE CHEST 1 VIEW COMPARISON:  April 08, 2016 FINDINGS: There is no definite acute cardiopulmonary process. The heart size is unremarkable. There is no pneumothorax or pleural effusion. There is a rounded 1 cm density in the left mid lung zone that appears to be new since 2017. IMPRESSION: 1. No definite acute cardiopulmonary process. 2. Rounded density in the left mid lung zone. A 4-6 week follow-up two-view chest x-ray is recommended as an outpatient to confirm stability or resolution of this finding. Electronically Signed   By: CConstance HolsterM.D.   On: 08/20/2020 20:36   [place]Abdomen Limited RUQ (LIVER/GB)  Result Date: 08/20/2020 CLINICAL DATA:  Initial evaluation for elevated LFTs. History of prostate cancer. EXAM: ULTRASOUND ABDOMEN LIMITED RIGHT UPPER QUADRANT COMPARISON:   None available. FINDINGS: Gallbladder: Layering echogenic sludge present within the  gallbladder lumen. No frank shadowing stones. Gallbladder wall measures at the upper limits of normal at 3 mm. No free pericholecystic fluid. No sonographic Murphy sign elicited on exam. Common bile duct: Diameter: 2.4 mm Liver: Liver demonstrates a somewhat coarsened echotexture with nodular contour. No focal intrahepatic lesions. Portal vein is patent on color Doppler imaging with normal direction of blood flow towards the liver. Other: Incidental note made of a right pleural effusion. IMPRESSION: 1. Gallbladder sludge without cholelithiasis or other sonographic features to suggest acute cholecystitis. 2. No biliary dilatation. 3. Coarse echotexture of the liver with nodular contour, suggesting possible cirrhosis. No focal intrahepatic lesions. 4. Right pleural effusion. Electronically Signed   By: Jeannine Boga M.D.   On: 08/20/2020 19:57     Impression / Plan:   Impression: 1.  Acute diverticulitis: CT with question of diverticulitis/enteritis, currently on IV Cipro and IV Flagyl, has not had a bowel movement since Saturday, none in the last 72 hours, is passing gas 2.  Nausea and vomiting: CT showed mild wall thickening in the ileum, question of possible enteritis 3.  Transaminitis: Viral hepatitis panel negative; alk phos 186-->, AST 254--> 415, ALT 238--> 319, total bilirubin 0.9--> 0.6, GGT 117, LFTs increased; question viral cause versus other 4.  Diarrhea: Stool studies pending, but patient has not had a bowel movement over the past 72 hours, so doubt this is infectious 5.  Leukocytosis: Related to diverticulitis most likely 6.  Severe headache: Recent cataract surgery surgery 2 weeks ago prior to onset of symptoms question relation  Plan: 1.  Stool studies are still pending/need to be collected but patient has not had a bowel movement now in the past 72 hours for him, he is passing gas. 2.  Patient  will benefit from an abdominal x-ray to ensure that he is not experiencing an ileus, at least prior to initiating any bowel purge/laxative 3.  Patient can continue his current diet as he seems to be tolerating small amounts of clears 4.  Uncertain etiology of elevated LFTs, will likely need further lab work-up, will discuss with Dr. Tarri Glenn 5.  Please await any further recommendations from Dr. Tarri Glenn later today.  Thank you for your kind consultation, we will continue to follow.  Lavone Nian Gisell Buehrle  08/22/2020, 8:31 AM

## 2020-08-22 NOTE — Progress Notes (Addendum)
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 Today he reports diarrhea has been resolved.  Continues to be nauseous.  His p.o. intake has been decreased due to the above symptoms.  His last bowel movement was on Sunday yesterday. He describes his headache as bilateral temporal headaches no associated changes with vision.  Assessment & Plan:   Principal Problem:   Diverticulitis Active Problems:   Hyperlipidemia   Nausea & vomiting   Abdominal pain   Diarrhea   Transaminitis   #1 acute diverticulitis/enteritis patient presented with abdominal pain fever nausea vomiting and diarrhea.    CT abdomen pelvis consistent with enteritis and diverticulitis.  Lactic acid on admission 1.6.  Patient denies any further vomiting since coming to the hospital his main complaint is abdominal distention.   Patient could not tolerate outpatient antibiotics as he continued to have nausea and vomiting.   I will continue Cipro and Flagyl and IV fluids.   We will continue him on clear liquids Patient has not had any bowel movements since coming to the hospital.   KUB normal findings no ileus reported  #2 possible viral meningitis CBC with lymphocytosis.  He came in with complaints of fever and neck pain   For the last 10 days.  Discussed with him options of doing a lumbar puncture he deferred.  Clinically he appears to be stable to have bacterial meningitis.  We will treat him symptomatically. His neck pain has decreased compared to yesterday.  #3 abnormal elevated LFTs likely viral induced versus NSAID induced.  Follow-up in a.m.  Hold Motrin Tylenol rosuvastatin. GI consulted appreciate their input. Right upper quadrant ultrasound shows sludge in the gallbladder  with no evidence of acute cholecystitis.  #4 headache patient is reporting severe headache for the last 10 days prior to admission.  He reports his headache started after he had cataract surgery. CT head no acute findings  Estimated body mass index is 25.1 kg/m as calculated from the following:   Height as of this encounter: 6\' 3"  (1.905 m).   Weight as of this encounter: 91.1 kg.  DVT prophylaxis: Lovenox  code Status: Full code Family Communication: None at bedside Disposition Plan:  Status is: Inpatient  Dispo: The patient is from: Home              Anticipated d/c is to: Home              Anticipated d/c date is: 3 days              Patient currently is not medically stable to d/c.    Consultants:  GI Procedures: None Antimicrobials: Cipro and Flagyl Subjective: He is resting in bed still does not feel a lot better than yesterday.  Continues with abdominal distention and severe headache.  Neck stiffness is improved.  Has been taking approximately 8 Tylenol and Motrin almost every other day for fever at home prior to admission  Objective: Vitals:   08/21/20 1408 08/21/20 1803 08/21/20 2114 08/22/20 0641  BP: 125/84 126/78 117/73 128/69  Pulse: 80 84 (!) 102 96  Resp: 14 20 18 18   Temp: 97.6 F (36.4 C) 98.4 F (36.9 C) 99.4 F (37.4 C) 99.1 F (37.3 C)  TempSrc: Oral Oral Oral Oral  SpO2: 95% 97% 95% 93%  Weight:      Height:        Intake/Output Summary (Last 24 hours) at 08/22/2020 1314 Last data filed at 08/22/2020 0903 Gross per 24 hour  Intake 2864.6 ml  Output 7 ml  Net 2857.6 ml   Filed Weights   08/21/20 1202  Weight: 91.1 kg    Examination:  General exam: Appears calm and comfortable  Respiratory system: Clear to auscultation. Respiratory effort normal. Cardiovascular system: S1 & S2 heard, RRR. No JVD, murmurs, rubs, gallops or clicks. No pedal edema. Gastrointestinal system: Abdomen is distended, soft and mild tender. No organomegaly or masses  felt. Normal bowel sounds heard. Central nervous system: Alert and oriented. No focal neurological deficits. Extremities: Symmetric 5 x 5 power. Skin: No rashes, lesions or ulcers Psychiatry: Judgement and insight appear normal. Mood & affect appropriate.     Data Reviewed: I have personally reviewed following labs and imaging studies  CBC: Recent Labs  Lab 08/20/20 1748 08/21/20 0415 08/22/20 0542  WBC 15.7* 16.7* 16.2*  NEUTROABS  --  4.8  --   HGB 15.5 12.8* 13.3  HCT 46.8 39.4 40.8  MCV 91.6 92.7 93.6  PLT 199 163 062   Basic Metabolic Panel: Recent Labs  Lab 08/20/20 1748 08/21/20 0415 08/21/20 1003 08/22/20 0542  NA 134* 133*  --  131*  K 4.3 4.7  --  4.3  CL 99 102  --  102  CO2 26 22  --  23  GLUCOSE 110* 105*  --  109*  BUN 16 16  --  15  CREATININE 1.38* 1.25*  --  1.25*  CALCIUM 8.4* 7.8*  --  7.6*  MG  --  1.9 2.0  --    GFR: Estimated Creatinine Clearance: 65.7 mL/min (A) (by C-G formula based on SCr of 1.25 mg/dL (H)). Liver Function Tests: Recent Labs  Lab 08/20/20 1748 08/21/20 0415 08/22/20 0542  AST 280* 254* 415*  ALT 297* 238* 319*  ALKPHOS 226* 186* 171*  BILITOT 1.0 0.9 0.6  PROT 6.4* 5.1* 5.1*  ALBUMIN 3.2* 2.6* 2.4*   Recent Labs  Lab 08/20/20 1748  LIPASE 27   No results for input(s): AMMONIA in the last 168 hours. Coagulation Profile: Recent Labs  Lab 08/21/20 0415  INR 1.0   Cardiac Enzymes: No results for input(s): CKTOTAL, CKMB, CKMBINDEX, TROPONINI in the last 168 hours. BNP (last 3 results) No results for input(s): PROBNP in the last 8760 hours. HbA1C: No results for input(s): HGBA1C in the last 72 hours. CBG: No results for input(s): GLUCAP in the last 168 hours. Lipid Profile: No results for input(s): CHOL, HDL, LDLCALC, TRIG, CHOLHDL, LDLDIRECT in the last 72 hours. Thyroid Function Tests: Recent Labs    08/21/20 0415  TSH 2.766   Anemia Panel: No results for input(s): VITAMINB12, FOLATE, FERRITIN,  TIBC, IRON, RETICCTPCT in the last 72 hours. Sepsis Labs: Recent Labs  Lab 08/20/20 2005  LATICACIDVEN 1.6    Recent Results (from the past 240 hour(s))  Culture, blood (single) w Reflex to ID Panel     Status: None   Collection Time: 08/14/20  9:38 AM   Specimen: Blood  Result Value Ref Range Status   MICRO NUMBER: [id number]  Final   SPECIMEN QUALITY: Suboptimal  Final   Source NOT GIVEN  Final   STATUS: FINAL  Final   Result:   Final    No growth after 5 days Inspection of blood culture  bottles indicates that an inadequate volume of blood may have been collected for the detection of sepsis.   COMMENT: Aerobic and anaerobic bottle received.  Final  Blood culture (routine x 2)     Status: None (Preliminary result)   Collection Time: 08/20/20  7:50 PM   Specimen: BLOOD  Result Value Ref Range Status   Specimen Description   Final    BLOOD LEFT ANTECUBITAL Performed at [facility] [address] [place], [place] [address]    Special Requests   Final    BOTTLES DRAWN AEROBIC AND ANAEROBIC Blood Culture adequate volume Performed at [facility] [address] [place], [place] [address]    Culture   Final    NO GROWTH 2 DAYS Performed at [facility] [address], [place], [place] [address]    Report Status PENDING  Incomplete  Blood culture (routine x 2)     Status: None (Preliminary result)   Collection Time: 08/20/20  8:05 PM   Specimen: BLOOD  Result Value Ref Range Status   Specimen Description   Final    BLOOD RIGHT ANTECUBITAL Performed at [facility] [address], [place], [place] [address]    Special Requests   Final    BOTTLES DRAWN AEROBIC AND ANAEROBIC Blood Culture adequate volume Performed at [facility] [address], [place], [place] [address]    Culture   Final    NO GROWTH 2 DAYS Performed at [facility] [address], [place], [place] [address]     Report Status PENDING  Incomplete  Respiratory Panel by RT PCR (Flu A&B, Covid) - Nasopharyngeal Swab     Status: None   Collection Time: 08/20/20  8:13 PM   Specimen: Nasopharyngeal Swab  Result Value Ref Range Status   SARS Coronavirus 2 by RT PCR NEGATIVE NEGATIVE Final    Comment: (NOTE) SARS-CoV-2 target nucleic acids are NOT DETECTED.  The SARS-CoV-2 RNA is generally detectable in upper respiratoy specimens during the acute phase of infection. The lowest concentration of SARS-CoV-2 viral copies this assay can detect is 131 copies/mL. A negative result does not preclude SARS-Cov-2 infection and should not be used as the sole basis for treatment or other patient management decisions. A negative result may occur with  improper specimen collection/handling, submission of specimen other than nasopharyngeal swab, presence of viral mutation(s) within the areas targeted by this assay, and inadequate number of viral copies (<131 copies/mL). A negative result must be combined with clinical observations, patient history, and epidemiological information. The expected result is Negative.  Fact Sheet for Patients:  PinkCheek.be  Fact Sheet for Healthcare Providers:  GravelBags.it  This test is no t yet approved or cleared by the [place] FDA and  has been authorized for detection and/or diagnosis of SARS-CoV-2 by FDA under an Emergency Use Authorization (EUA). This EUA will remain  in effect (meaning this test can be used) for the duration of the COVID-19 declaration under Section 564(b)(1) of the Act, 21 U.S.C. section 360bbb-3(b)(1), unless the authorization is terminated or revoked sooner.     Influenza A by PCR NEGATIVE NEGATIVE Final   Influenza B by PCR NEGATIVE NEGATIVE Final    Comment: (NOTE) The Xpert Xpress SARS-CoV-2/FLU/RSV assay is intended as an aid in  the diagnosis of influenza from Nasopharyngeal swab  specimens and  should not be used as a sole basis for treatment. Nasal washings and  aspirates  are unacceptable for Xpert Xpress SARS-CoV-2/FLU/RSV  testing.  Fact Sheet for Patients: PinkCheek.be  Fact Sheet for Healthcare Providers: GravelBags.it  This test is not yet approved or cleared by the [place] FDA and  has been authorized for detection and/or diagnosis of SARS-CoV-2 by  FDA under an Emergency Use Authorization (EUA). This EUA will remain  in effect (meaning this test can be used) for the duration of the  Covid-19 declaration under Section 564(b)(1) of the Act, 21  U.S.C. section 360bbb-3(b)(1), unless the authorization is  terminated or revoked. Performed at [facility], [place] [address], [place], [place] [address]          Radiology Studies: CT ABDOMEN PELVIS W CONTRAST  Result Date: 08/20/2020 CLINICAL DATA:  Abdominal pain and fever EXAM: CT ABDOMEN AND PELVIS WITH CONTRAST TECHNIQUE: Multidetector CT imaging of the abdomen and pelvis was performed using the standard protocol following bolus administration of intravenous contrast. CONTRAST:  175mL OMNIPAQUE IOHEXOL 300 MG/ML  SOLN COMPARISON:  None. FINDINGS: Lower chest: The visualized heart size within normal limits. No pericardial fluid/thickening. No hiatal hernia. The visualized portions of the lungs are clear. Hepatobiliary: The liver is normal in density without focal abnormality.The main portal vein is patent. No evidence of calcified gallstones, gallbladder wall thickening or biliary dilatation. Pancreas: Unremarkable. No pancreatic ductal dilatation or surrounding inflammatory changes. Spleen: Normal in size without focal abnormality. Adrenals/Urinary Tract: Both adrenal glands appear normal. Again noted are right-sided parapelvic cyst. The kidneys and collecting system appear normal without evidence of urinary tract calculus or  hydronephrosis. Bladder is unremarkable. Stomach/Bowel: The stomach is unremarkable. There are several loops distal ileum within the left lower quadrant which appear to have hyperenhancement and surrounding mild fat stranding changes. No loculated fluid collections are seen. There is also a focal segment of sigmoid colon within the right lower pelvis, series 2, image 72 with diffuse wall thickening and surrounding fat stranding changes. In mild inflammatory changes extend within the bilateral pericolic gutters. No definite free air is seen. Moderate amount of colonic stool is present. Vascular/Lymphatic: There are no enlarged mesenteric, retroperitoneal, or pelvic lymph nodes. Scattered aortic atherosclerotic calcifications are seen without aneurysmal dilatation. Reproductive: The patient is status post prostatectomy. Other: No evidence of abdominal wall mass or hernia. Musculoskeletal: No acute or significant osseous findings. IMPRESSION: Mild wall thickening and inflammatory changes around distal ileal loops and sigmoid colon which could represent scattered areas of enteritis and diverticulitis. No loculated fluid collections or free air. Mild inflammatory changes seen within the bilateral pericolic gutters. Aortic Atherosclerosis (ICD10-I70.0). Electronically Signed   By: Prudencio Pair M.D.   On: 08/20/2020 22:30   DG Chest Portable 1 View  Result Date: 08/20/2020 CLINICAL DATA:  Cough EXAM: PORTABLE CHEST 1 VIEW COMPARISON:  April 08, 2016 FINDINGS: There is no definite acute cardiopulmonary process. The heart size is unremarkable. There is no pneumothorax or pleural effusion. There is a rounded 1 cm density in the left mid lung zone that appears to be new since 2017. IMPRESSION: 1. No definite acute cardiopulmonary process. 2. Rounded density in the left mid lung zone. A 4-6 week follow-up two-view chest x-ray is recommended as an outpatient to confirm stability or resolution of this finding. Electronically  Signed   By: Constance Holster M.D.   On: 08/20/2020 20:36   US Abdomen Limited RUQ (LIVER/GB)  Result Date: 08/20/2020 CLINICAL DATA:  Initial evaluation for elevated LFTs. History of prostate cancer. EXAM: ULTRASOUND ABDOMEN LIMITED RIGHT  UPPER QUADRANT COMPARISON:  None available. FINDINGS: Gallbladder: Layering echogenic sludge present within the gallbladder lumen. No frank shadowing stones. Gallbladder wall measures at the upper limits of normal at 3 mm. No free pericholecystic fluid. No sonographic Murphy sign elicited on exam. Common bile duct: Diameter: 2.4 mm Liver: Liver demonstrates a somewhat coarsened echotexture with nodular contour. No focal intrahepatic lesions. Portal vein is patent on color Doppler imaging with normal direction of blood flow towards the liver. Other: Incidental note made of a right pleural effusion. IMPRESSION: 1. Gallbladder sludge without cholelithiasis or other sonographic features to suggest acute cholecystitis. 2. No biliary dilatation. 3. Coarse echotexture of the liver with nodular contour, suggesting possible cirrhosis. No focal intrahepatic lesions. 4. Right pleural effusion. Electronically Signed   By: Jeannine Boga M.D.   On: 08/20/2020 19:57        Scheduled Meds: . Difluprednate  1 drop Left Eye TID  . gatifloxacin  1 drop Right Eye TID  . ketorolac  1 drop Left Eye QHS   Continuous Infusions: . ciprofloxacin 400 mg (08/22/20 1229)  . lactated ringers 75 mL/hr at 08/21/20 2003  . metronidazole 500 mg (08/22/20 0524)     LOS: 2 days   Georgette Shell, MD 08/22/2020, 1:14 PM

## 2020-08-23 DIAGNOSIS — R197 Diarrhea, unspecified: Secondary | ICD-10-CM

## 2020-08-23 LAB — COMPREHENSIVE METABOLIC PANEL
ALT: 262 U/L — ABNORMAL HIGH (ref 0–44)
AST: 251 U/L — ABNORMAL HIGH (ref 15–41)
Albumin: 2.6 g/dL — ABNORMAL LOW (ref 3.5–5.0)
Alkaline Phosphatase: 171 U/L — ABNORMAL HIGH (ref 38–126)
Anion gap: 5 (ref 5–15)
BUN: 13 mg/dL (ref 8–23)
CO2: 26 mmol/L (ref 22–32)
Calcium: 7.7 mg/dL — ABNORMAL LOW (ref 8.9–10.3)
Chloride: 99 mmol/L (ref 98–111)
Creatinine, Ser: 1.32 mg/dL — ABNORMAL HIGH (ref 0.61–1.24)
GFR, Estimated: 58 mL/min — ABNORMAL LOW (ref >60)
Glucose, Bld: 110 mg/dL — ABNORMAL HIGH (ref 70–99)
Potassium: 4.3 mmol/L (ref 3.5–5.1)
Sodium: 130 mmol/L — ABNORMAL LOW (ref 135–145)
Total Bilirubin: 0.6 mg/dL (ref 0.3–1.2)
Total Protein: 5.6 g/dL — ABNORMAL LOW (ref 6.5–8.1)

## 2020-08-23 LAB — CBC
HCT: 43.5 % (ref 39.0–52.0)
Hemoglobin: 14.1 g/dL (ref 13.0–17.0)
MCH: 30.3 pg (ref 26.0–34.0)
MCHC: 32.4 g/dL (ref 30.0–36.0)
MCV: 93.3 fL (ref 80.0–100.0)
Platelets: 179 10*3/uL (ref 150–400)
RBC: 4.66 MIL/uL (ref 4.22–5.81)
RDW: 13.9 % (ref 11.5–15.5)
WBC: 19.5 10*3/uL — ABNORMAL HIGH (ref 4.0–10.5)
nRBC: 0 % (ref 0.0–0.2)

## 2020-08-23 MED ORDER — POLYETHYLENE GLYCOL 3350 17 G PO PACK
17.0000 g | PACK | Freq: Every day | ORAL | Status: DC | PRN
  Administered 2020-08-23: 17 g via ORAL
  Filled 2020-08-23: qty 1

## 2020-08-23 NOTE — Progress Notes (Signed)
PROGRESS NOTE    Dwayne Young  HCW:[id number] DOB: Oct 04, 1951 DOA: 08/20/2020 PCP: Susy Frizzle, MD    Brief Narrative:  69 year old gentleman with history of hyperlipidemia, no other major medical problems admitted to the hospital with 2 weeks of nausea, vomiting and diarrhea, headache as well as temperature of 102 at home.  Found to have gastroenteritis with dehydration and admitted to the hospital.   Assessment & Plan:   Principal Problem:   Diverticulitis Active Problems:   Hyperlipidemia   Nausea & vomiting   Abdominal pain   Diarrhea   Transaminitis   LFT elevation   Abnormal CT scan, gastrointestinal tract  Acute abdominal pain/acute diverticulitis and enteritis: CT scan abdomen pelvis consistent with enteritis and diverticulitis.  Symptoms ongoing for almost 2 weeks. Gradually improving.  Suspect viral gastroenteritis, less likely Salmonella.  Blood cultures negative so far. On empiric treatment with ciprofloxacin and Flagyl, no more loose bowel movement and needing suppository. LFTs mildly elevated, stabilizing. Continue maintenance IV fluids until he can take by mouth. Appreciate GI input.  Fever, headache, systemic prodrome: Likely viral syndrome.  No evidence of CNS infection.  Symptomatic treatment.  Abnormal LFTs: Stabilizing.  Right upper quadrant ultrasound was normal with some sludge in the gallbladder.  No cholecystitis.   DVT prophylaxis: SCDs Start: 08/20/20 2335   Code Status: Full code Family Communication: Wife at the bedside Disposition Plan: Status is: Inpatient  Remains inpatient appropriate because:IV treatments appropriate due to intensity of illness or inability to take PO and Inpatient level of care appropriate due to severity of illness   Dispo: The patient is from: Home              Anticipated d/c is to: Home              Anticipated d/c date is: 2 days              Patient currently is not medically stable to d/c.          Consultants:   Gastroenterology  Procedures:   None  Antimicrobials:  Antibiotics Given (last 72 hours)    Date/Time Action Medication Dose Rate   08/20/20 2248 New Bag/Given   ciprofloxacin (CIPRO) IVPB 400 mg 400 mg 200 mL/hr   08/20/20 2249 New Bag/Given   metroNIDAZOLE (FLAGYL) IVPB 500 mg 500 mg 100 mL/hr   08/21/20 0540 New Bag/Given   metroNIDAZOLE (FLAGYL) IVPB 500 mg 500 mg 100 mL/hr   08/21/20 1155 New Bag/Given   ciprofloxacin (CIPRO) IVPB 400 mg 400 mg 200 mL/hr   08/21/20 1443 New Bag/Given   metroNIDAZOLE (FLAGYL) IVPB 500 mg 500 mg 100 mL/hr   08/21/20 2131 New Bag/Given   ciprofloxacin (CIPRO) IVPB 400 mg 400 mg 200 mL/hr   08/22/20 0028 New Bag/Given   metroNIDAZOLE (FLAGYL) IVPB 500 mg 500 mg 100 mL/hr   08/22/20 0524 New Bag/Given   metroNIDAZOLE (FLAGYL) IVPB 500 mg 500 mg 100 mL/hr   08/22/20 1229 New Bag/Given  [loss of iv]   ciprofloxacin (CIPRO) IVPB 400 mg 400 mg 200 mL/hr   08/22/20 1516 New Bag/Given   metroNIDAZOLE (FLAGYL) IVPB 500 mg 500 mg 100 mL/hr   08/22/20 2040 New Bag/Given   metroNIDAZOLE (FLAGYL) IVPB 500 mg 500 mg 100 mL/hr   08/22/20 2209 New Bag/Given   ciprofloxacin (CIPRO) IVPB 400 mg 400 mg 200 mL/hr   08/23/20 0612 New Bag/Given   metroNIDAZOLE (FLAGYL) IVPB 500 mg 500 mg 100 mL/hr   08/23/20  1057 New Bag/Given   ciprofloxacin (CIPRO) IVPB 400 mg 400 mg 200 mL/hr   08/23/20 1359 New Bag/Given   metroNIDAZOLE (FLAGYL) IVPB 500 mg 500 mg 100 mL/hr         Subjective: Patient seen and examined.  He still has some headache but much better than before.  He had a small loose bowel movement last night.  Denies any nausea vomiting and looking forward to advance diet.  Low-grade temperature 100.1 overnight.  No abdominal pain, mild bloating present.  Objective: Vitals:   08/22/20 2101 08/22/20 2316 08/23/20 0535 08/23/20 1346  BP: 120/74  123/79 122/77  Pulse: 92  99 90  Resp: 18  17 18   Temp: 100.1 F (37.8 C) 98.4 F  (36.9 C) 99.8 F (37.7 C) 98.5 F (36.9 C)  TempSrc: Oral  Oral Oral  SpO2: 96%  92% 97%  Weight:      Height:        Intake/Output Summary (Last 24 hours) at 08/23/2020 1501 Last data filed at 08/23/2020 1400 Gross per 24 hour  Intake 3591.64 ml  Output 2 ml  Net 3589.64 ml   Filed Weights   08/21/20 1202  Weight: 91.1 kg    Examination:  General exam: Appears calm and comfortable  Respiratory system: Clear to auscultation. Respiratory effort normal. Cardiovascular system: S1 & S2 heard, RRR.  Gastrointestinal system: Mild pain on deep palpation.  No rigidity or guarding.  No localized tenderness. Central nervous system: Alert and oriented. No focal neurological deficits. Extremities: Symmetric 5 x 5 power. Skin: No rashes, lesions or ulcers Psychiatry: Judgement and insight appear normal. Mood & affect appropriate.     Data Reviewed: I have personally reviewed following labs and imaging studies  CBC: Recent Labs  Lab 08/20/20 1748 08/21/20 0415 08/22/20 0542 08/23/20 0510  WBC 15.7* 16.7* 16.2* 19.5*  NEUTROABS  --  4.8  --   --   HGB 15.5 12.8* 13.3 14.1  HCT 46.8 39.4 40.8 43.5  MCV 91.6 92.7 93.6 93.3  PLT 199 163 169 829   Basic Metabolic Panel: Recent Labs  Lab 08/20/20 1748 08/21/20 0415 08/21/20 1003 08/22/20 0542 08/23/20 0510  NA 134* 133*  --  131* 130*  K 4.3 4.7  --  4.3 4.3  CL 99 102  --  102 99  CO2 26 22  --  23 26  GLUCOSE 110* 105*  --  109* 110*  BUN 16 16  --  15 13  CREATININE 1.38* 1.25*  --  1.25* 1.32*  CALCIUM 8.4* 7.8*  --  7.6* 7.7*  MG  --  1.9 2.0  --   --    GFR: Estimated Creatinine Clearance: 62.2 mL/min (A) (by C-G formula based on SCr of 1.32 mg/dL (H)). Liver Function Tests: Recent Labs  Lab 08/20/20 1748 08/21/20 0415 08/22/20 0542 08/23/20 0510  AST 280* 254* 415* 251*  ALT 297* 238* 319* 262*  ALKPHOS 226* 186* 171* 171*  BILITOT 1.0 0.9 0.6 0.6  PROT 6.4* 5.1* 5.1* 5.6*  ALBUMIN 3.2* 2.6* 2.4*  2.6*   Recent Labs  Lab 08/20/20 1748  LIPASE 27   No results for input(s): AMMONIA in the last 168 hours. Coagulation Profile: Recent Labs  Lab 08/21/20 0415  INR 1.0   Cardiac Enzymes: No results for input(s): CKTOTAL, CKMB, CKMBINDEX, TROPONINI in the last 168 hours. BNP (last 3 results) No results for input(s): PROBNP in the last 8760 hours. HbA1C: No results for input(s): HGBA1C  in the last 72 hours. CBG: No results for input(s): GLUCAP in the last 168 hours. Lipid Profile: No results for input(s): CHOL, HDL, LDLCALC, TRIG, CHOLHDL, LDLDIRECT in the last 72 hours. Thyroid Function Tests: Recent Labs    08/21/20 0415  TSH 2.766   Anemia Panel: No results for input(s): VITAMINB12, FOLATE, FERRITIN, TIBC, IRON, RETICCTPCT in the last 72 hours. Sepsis Labs: Recent Labs  Lab 08/20/20 2005  LATICACIDVEN 1.6    Recent Results (from the past 240 hour(s))  Culture, blood (single) w Reflex to ID Panel     Status: None   Collection Time: 08/14/20  9:38 AM   Specimen: Blood  Result Value Ref Range Status   MICRO NUMBER: [id number]  Final   SPECIMEN QUALITY: Suboptimal  Final   Source NOT GIVEN  Final   STATUS: FINAL  Final   Result:   Final    No growth after 5 days Inspection of blood culture bottles indicates that an inadequate volume of blood may have been collected for the detection of sepsis.   COMMENT: Aerobic and anaerobic bottle received.  Final  Blood culture (routine x 2)     Status: None (Preliminary result)   Collection Time: 08/20/20  7:50 PM   Specimen: BLOOD  Result Value Ref Range Status   Specimen Description   Final    BLOOD LEFT ANTECUBITAL Performed at [facility] [address], [place], [place] [address]    Special Requests   Final    BOTTLES DRAWN AEROBIC AND ANAEROBIC Blood Culture adequate volume Performed at [facility] [address], [place], [place] [address]    Culture   Final    NO GROWTH 3  DAYS Performed at [facility], [place] [address] [place], [place] [address]    Report Status PENDING  Incomplete  Blood culture (routine x 2)     Status: None (Preliminary result)   Collection Time: 08/20/20  8:05 PM   Specimen: BLOOD  Result Value Ref Range Status   Specimen Description   Final    BLOOD RIGHT ANTECUBITAL Performed at [facility] [address], [place], [place] [address]    Special Requests   Final    BOTTLES DRAWN AEROBIC AND ANAEROBIC Blood Culture adequate volume Performed at [facility] [address] [place], [place] [address]    Culture   Final    NO GROWTH 3 DAYS Performed at [facility], [place] [address], [place],  [address]    Report Status PENDING  Incomplete  Respiratory Panel by RT PCR (Flu A&B, Covid) - Nasopharyngeal Swab     Status: None   Collection Time: 08/20/20  8:13 PM   Specimen: Nasopharyngeal Swab  Result Value Ref Range Status   SARS Coronavirus 2 by RT PCR NEGATIVE NEGATIVE Final    Comment: (NOTE) SARS-CoV-2 target nucleic acids are NOT DETECTED.  The SARS-CoV-2 RNA is generally detectable in upper respiratoy specimens during the acute phase of infection. The lowest concentration of SARS-CoV-2 viral copies this assay can detect is 131 copies/mL. A negative result does not preclude SARS-Cov-2 infection and should not be used as the sole basis for treatment or other patient management decisions. A negative result may occur with  improper specimen collection/handling, submission of specimen other than nasopharyngeal swab, presence of viral mutation(s) within the areas targeted by this assay, and inadequate number of viral copies (<131 copies/mL). A negative result must be  combined with clinical observations, patient history, and epidemiological information. The expected result is Negative.  Fact Sheet for Patients:  PinkCheek.be  Fact Sheet for  Healthcare Providers:  GravelBags.it  This test is no t yet approved or cleared by the [place] FDA and  has been authorized for detection and/or diagnosis of SARS-CoV-2 by FDA under an Emergency Use Authorization (EUA). This EUA will remain  in effect (meaning this test can be used) for the duration of the COVID-19 declaration under Section 564(b)(1) of the Act, 21 U.S.C. section 360bbb-3(b)(1), unless the authorization is terminated or revoked sooner.     Influenza A by PCR NEGATIVE NEGATIVE Final   Influenza B by PCR NEGATIVE NEGATIVE Final    Comment: (NOTE) The Xpert Xpress SARS-CoV-2/FLU/RSV assay is intended as an aid in  the diagnosis of influenza from Nasopharyngeal swab specimens and  should not be used as a sole basis for treatment. Nasal washings and  aspirates are unacceptable for Xpert Xpress SARS-CoV-2/FLU/RSV  testing.  Fact Sheet for Patients: PinkCheek.be  Fact Sheet for Healthcare Providers: GravelBags.it  This test is not yet approved or cleared by the [place] FDA and  has been authorized for detection and/or diagnosis of SARS-CoV-2 by  FDA under an Emergency Use Authorization (EUA). This EUA will remain  in effect (meaning this test can be used) for the duration of the  Covid-19 declaration under Section 564(b)(1) of the Act, 21  U.S.C. section 360bbb-3(b)(1), unless the authorization is  terminated or revoked. Performed at [facility], [place] [address], [place], [place] [address]          Radiology Studies: DG Abd 1 View  Result Date: 08/22/2020 CLINICAL DATA:  Abdominal distension. EXAM: ABDOMEN - 1 VIEW COMPARISON:  None. FINDINGS: The bowel gas pattern is normal. No radio-opaque calculi or other significant radiographic abnormality are seen. IMPRESSION: Negative. Electronically Signed   By: Marijo Conception M.D.   On: 08/22/2020 14:17    CT HEAD WO CONTRAST  Result Date: 08/22/2020 CLINICAL DATA:  Headache with nausea and vomiting for approximately 10 days EXAM: CT HEAD WITHOUT CONTRAST TECHNIQUE: Contiguous axial images were obtained from the base of the skull through the vertex without intravenous contrast. COMPARISON:  None. FINDINGS: Brain: Ventricles and sulci are normal in size and configuration. There is no intracranial mass, hemorrhage, extra-axial fluid collection, or midline shift. The brain parenchyma appears unremarkable. There is no evident acute infarct. Vascular: There is no hyperdense vessel. There is mild calcification in each carotid siphon region. Skull: The bony calvarium appears intact. Sinuses/Orbits: Mucosal thickening noted in several ethmoid air cells. Other visualized paranasal sinuses are clear. Evidence of previous cataract removal on the right. Orbits otherwise appear symmetric bilaterally. Other: Mastoid air cells are clear. IMPRESSION: Normal appearing brain parenchyma. No evident acute infarct. No mass or hemorrhage. Mild arterial vascular calcification noted. Mucosal thickening noted in several ethmoid air cells. Electronically Signed   By: Lowella Grip III M.D.   On: 08/22/2020 14:51        Scheduled Meds: . Difluprednate  1 drop Left Eye TID  . gatifloxacin  1 drop Right Eye TID  . ketorolac  1 drop Left Eye QHS   Continuous Infusions: . ciprofloxacin 400 mg (08/23/20 1057)  . lactated ringers 75 mL/hr at 08/23/20 1400  . metronidazole 500 mg (08/23/20 1359)     LOS: 3 days    Time spent: 25 minutes    Barb Merino, MD [facility]  Pager [phone]

## 2020-08-23 NOTE — Progress Notes (Signed)
Progress Note   Subjective  Chief Complaint: Elevated LFTs, abdominal pain and diarrhea  This morning patient tells me that he is about to take his second enema, the first 1 helped him to have a small amount of still loose stool, he remains bloated and generally uneasy in his stomach, but tells me things are much better than when he came in because at that point the pain was "excruciating".  His headaches are also manageable with medicine at this point.   Objective   Vital signs in last 24 hours: Temp:  [98.4 F (36.9 C)-100.1 F (37.8 C)] 99.8 F (37.7 C) (11/10 0535) Pulse Rate:  [86-99] 99 (11/10 0535) Resp:  [17-18] 17 (11/10 0535) BP: (120-128)/(74-79) 123/79 (11/10 0535) SpO2:  [92 %-98 %] 92 % (11/10 0535) Last BM Date: 08/19/20 General:    white male in NAD Heart:  Regular rate and rhythm; no murmurs Lungs: Respirations even and unlabored, lungs CTA bilaterally Abdomen:  Soft, mild generalized TTP and mild distention. Normal bowel sounds. Extremities:  Without edema. Neurologic:  Alert and oriented,  grossly normal neurologically. Psych:  Cooperative. Normal mood and affect.  Intake/Output from previous day: 11/09 0701 - 11/10 0700 In: 3571.6 [P.O.:1320; I.V.:1440; IV Piggyback:811.6] Out: 2 [Urine:1; Stool:1]  Lab Results: Recent Labs    08/21/20 0415 08/22/20 0542 08/23/20 0510  WBC 16.7* 16.2* 19.5*  HGB 12.8* 13.3 14.1  HCT 39.4 40.8 43.5  PLT 163 169 179   BMET Recent Labs    08/21/20 0415 08/22/20 0542 08/23/20 0510  NA 133* 131* 130*  K 4.7 4.3 4.3  CL 102 102 99  CO2 '22 23 26  ' GLUCOSE 105* 109* 110*  BUN '16 15 13  ' CREATININE 1.25* 1.25* 1.32*  CALCIUM 7.8* 7.6* 7.7*   Hepatic Function Latest Ref Rng & Units 08/23/2020 08/22/2020 08/21/2020  Total Protein 6.5 - 8.1 g/dL 5.6(L) 5.1(L) 5.1(L)  Albumin 3.5 - 5.0 g/dL 2.6(L) 2.4(L) 2.6(L)  AST 15 - 41 U/L 251(H) 415(H) 254(H)  ALT 0 - 44 U/L 262(H) 319(H) 238(H)  Alk Phosphatase 38 - 126  U/L 171(H) 171(H) 186(H)  Total Bilirubin 0.3 - 1.2 mg/dL 0.6 0.6 0.9  Bilirubin, Direct 0.0 - 0.3 mg/dL - - -    PT/INR Recent Labs    08/21/20 0415  LABPROT 13.1  INR 1.0    Studies/Results: DG Abd 1 View  Result Date: 08/22/2020 CLINICAL DATA:  Abdominal distension. EXAM: ABDOMEN - 1 VIEW COMPARISON:  None. FINDINGS: The bowel gas pattern is normal. No radio-opaque calculi or other significant radiographic abnormality are seen. IMPRESSION: Negative. Electronically Signed   By: Marijo Conception M.D.   On: 08/22/2020 14:17   CT HEAD WO CONTRAST  Result Date: 08/22/2020 CLINICAL DATA:  Headache with nausea and vomiting for approximately 10 days EXAM: CT HEAD WITHOUT CONTRAST TECHNIQUE: Contiguous axial images were obtained from the base of the skull through the vertex without intravenous contrast. COMPARISON:  None. FINDINGS: Brain: Ventricles and sulci are normal in size and configuration. There is no intracranial mass, hemorrhage, extra-axial fluid collection, or midline shift. The brain parenchyma appears unremarkable. There is no evident acute infarct. Vascular: There is no hyperdense vessel. There is mild calcification in each carotid siphon region. Skull: The bony calvarium appears intact. Sinuses/Orbits: Mucosal thickening noted in several ethmoid air cells. Other visualized paranasal sinuses are clear. Evidence of previous cataract removal on the right. Orbits otherwise appear symmetric bilaterally. Other: Mastoid air cells are clear. IMPRESSION:  Normal appearing brain parenchyma. No evident acute infarct. No mass or hemorrhage. Mild arterial vascular calcification noted. Mucosal thickening noted in several ethmoid air cells. Electronically Signed   By: Lowella Grip III M.D.   On: 08/22/2020 14:51     Assessment / Plan:   Assessment: 1.  Enterocolitis: Question of this is causing abdominal pain and symptoms, uncertain etiology, possibly viral 2.  Nausea and vomiting: Seems to be  getting better 3.  Transaminitis: See trend above, seems to be trending downward 4.  Diarrhea: Resolved 5.  Leukocytosis: Worsened overnight 6.  Severe headache: Manageable at this point  Plan: 1.  Patient no longer has extreme diarrhea, in fact he is requiring suppositories in order to have a bowel movement at all, abdominal x-ray yesterday with no obstruction 2.  Did asked the patient about MiraLAX but he would prefer not to "put anything on my stomach that could irritate it".  We will continue suppositories 3.  Continue to monitor LFTs of the seem to trended down overnight 4.  Please await any further recommendations from Dr. Tarri Glenn later today.  Thank you for kind consultation, we will continue to follow.    LOS: 3 days   Levin Erp  08/23/2020, 9:18 AM

## 2020-08-23 NOTE — Care Management Important Message (Signed)
Important Message  Patient Details IM Letter given to the Patient Name: Dwayne Young MRN: [id number] Date of Birth: July 18, 1951   Medicare Important Message Given:  Yes     Kerin Salen 08/23/2020, 10:11 AM

## 2020-08-24 ENCOUNTER — Encounter: Payer: Self-pay | Admitting: Family Medicine

## 2020-08-24 DIAGNOSIS — K5792 Diverticulitis of intestine, part unspecified, without perforation or abscess without bleeding: Secondary | ICD-10-CM | POA: Diagnosis not present

## 2020-08-24 DIAGNOSIS — R7989 Other specified abnormal findings of blood chemistry: Secondary | ICD-10-CM | POA: Diagnosis not present

## 2020-08-24 DIAGNOSIS — R933 Abnormal findings on diagnostic imaging of other parts of digestive tract: Secondary | ICD-10-CM | POA: Diagnosis not present

## 2020-08-24 DIAGNOSIS — R197 Diarrhea, unspecified: Secondary | ICD-10-CM | POA: Diagnosis not present

## 2020-08-24 LAB — COMPREHENSIVE METABOLIC PANEL
ALT: 180 U/L — ABNORMAL HIGH (ref 0–44)
AST: 134 U/L — ABNORMAL HIGH (ref 15–41)
Albumin: 2.4 g/dL — ABNORMAL LOW (ref 3.5–5.0)
Alkaline Phosphatase: 144 U/L — ABNORMAL HIGH (ref 38–126)
Anion gap: 6 (ref 5–15)
BUN: 12 mg/dL (ref 8–23)
CO2: 24 mmol/L (ref 22–32)
Calcium: 7.6 mg/dL — ABNORMAL LOW (ref 8.9–10.3)
Chloride: 101 mmol/L (ref 98–111)
Creatinine, Ser: 1.29 mg/dL — ABNORMAL HIGH (ref 0.61–1.24)
GFR, Estimated: 60 mL/min — ABNORMAL LOW (ref >60)
Glucose, Bld: 108 mg/dL — ABNORMAL HIGH (ref 70–99)
Potassium: 4.9 mmol/L (ref 3.5–5.1)
Sodium: 131 mmol/L — ABNORMAL LOW (ref 135–145)
Total Bilirubin: 0.6 mg/dL (ref 0.3–1.2)
Total Protein: 5.1 g/dL — ABNORMAL LOW (ref 6.5–8.1)

## 2020-08-24 LAB — CBC
HCT: 40.5 % (ref 39.0–52.0)
Hemoglobin: 13.2 g/dL (ref 13.0–17.0)
MCH: 30.1 pg (ref 26.0–34.0)
MCHC: 32.6 g/dL (ref 30.0–36.0)
MCV: 92.5 fL (ref 80.0–100.0)
Platelets: 154 10*3/uL (ref 150–400)
RBC: 4.38 MIL/uL (ref 4.22–5.81)
RDW: 13.9 % (ref 11.5–15.5)
WBC: 20.7 10*3/uL — ABNORMAL HIGH (ref 4.0–10.5)
nRBC: 0 % (ref 0.0–0.2)

## 2020-08-24 LAB — DIFFERENTIAL
Abs Immature Granulocytes: 0.07 10*3/uL (ref 0.00–0.07)
Basophils Absolute: 0.4 10*3/uL — ABNORMAL HIGH (ref 0.0–0.1)
Basophils Relative: 2 %
Eosinophils Absolute: 0.2 10*3/uL (ref 0.0–0.5)
Eosinophils Relative: 1 %
Immature Granulocytes: 0 %
Lymphocytes Relative: 61 %
Lymphs Abs: 12.8 10*3/uL — ABNORMAL HIGH (ref 0.7–4.0)
Monocytes Absolute: 3.1 10*3/uL — ABNORMAL HIGH (ref 0.1–1.0)
Monocytes Relative: 15 %
Neutro Abs: 4.4 10*3/uL (ref 1.7–7.7)
Neutrophils Relative %: 21 %

## 2020-08-24 MED ORDER — CODEINE SULFATE 30 MG PO TABS
30.0000 mg | ORAL_TABLET | Freq: Four times a day (QID) | ORAL | Status: DC | PRN
  Administered 2020-08-24: 20:00:00 30 mg via ORAL
  Filled 2020-08-24: qty 1

## 2020-08-24 NOTE — Progress Notes (Signed)
Progress Note   Subjective  Chief Complaint: Elevated LFTs, abdominal pain and diarrhea, CT with entero-colitis  Today, the patient tells me he started with a fever again last night and his white count is back up.  He has had no bowel movements which is passing gas.  He is trying to eat/force himself to eat given that his abdomen is severely bloated and he has abdominal distention and some discomfort from this, but still "not as severe as prior to coming to the hospital".  Otherwise patient is trying to get himself up to walk up and down the halls, but in general still feels pretty sick.   Objective   Vital signs in last 24 hours: Temp:  [98.5 F (36.9 C)-99.8 F (37.7 C)] 99.3 F (37.4 C) (11/11 0611) Pulse Rate:  [90-100] 100 (11/11 0611) Resp:  [17-18] 17 (11/11 0611) BP: (96-131)/(42-81) 131/81 (11/11 0611) SpO2:  [91 %-97 %] 94 % (11/11 0611) Last BM Date: 08/23/20 General:  White male in NAD Heart:  Regular rate and rhythm; no murmurs Lungs: Respirations even and unlabored, lungs CTA bilaterally Abdomen: Tense, mild generalized TTP, moderate distention  Extremities:  Without edema. Neurologic:  Alert and oriented,  grossly normal neurologically. Psych:  Cooperative. Normal mood and affect.  Intake/Output from previous day: 11/10 0701 - 11/11 0700 In: 3196.7 [P.O.:950; I.V.:1485; IV Piggyback:761.7] Out: 0   Lab Results: Recent Labs    08/22/20 0542 08/23/20 0510 08/24/20 0452  WBC 16.2* 19.5* 20.7*  HGB 13.3 14.1 13.2  HCT 40.8 43.5 40.5  PLT 169 179 154   BMET Recent Labs    08/22/20 0542 08/23/20 0510 08/24/20 0452  NA 131* 130* 131*  K 4.3 4.3 4.9  CL 102 99 101  CO2 '23 26 24  ' GLUCOSE 109* 110* 108*  BUN '15 13 12  ' CREATININE 1.25* 1.32* 1.29*  CALCIUM 7.6* 7.7* 7.6*   Hepatic Function Latest Ref Rng & Units 08/24/2020 08/23/2020 08/22/2020  Total Protein 6.5 - 8.1 g/dL 5.1(L) 5.6(L) 5.1(L)  Albumin 3.5 - 5.0 g/dL 2.4(L) 2.6(L) 2.4(L)  AST 15 -  41 U/L 134(H) 251(H) 415(H)  ALT 0 - 44 U/L 180(H) 262(H) 319(H)  Alk Phosphatase 38 - 126 U/L 144(H) 171(H) 171(H)  Total Bilirubin 0.3 - 1.2 mg/dL 0.6 0.6 0.6  Bilirubin, Direct 0.0 - 0.3 mg/dL - - -   Studies/Results: DG Abd 1 View  Result Date: 08/22/2020 CLINICAL DATA:  Abdominal distension. EXAM: ABDOMEN - 1 VIEW COMPARISON:  None. FINDINGS: The bowel gas pattern is normal. No radio-opaque calculi or other significant radiographic abnormality are seen. IMPRESSION: Negative. Electronically Signed   By: Marijo Conception M.D.   On: 08/22/2020 14:17   CT HEAD WO CONTRAST  Result Date: 08/22/2020 CLINICAL DATA:  Headache with nausea and vomiting for approximately 10 days EXAM: CT HEAD WITHOUT CONTRAST TECHNIQUE: Contiguous axial images were obtained from the base of the skull through the vertex without intravenous contrast. COMPARISON:  None. FINDINGS: Brain: Ventricles and sulci are normal in size and configuration. There is no intracranial mass, hemorrhage, extra-axial fluid collection, or midline shift. The brain parenchyma appears unremarkable. There is no evident acute infarct. Vascular: There is no hyperdense vessel. There is mild calcification in each carotid siphon region. Skull: The bony calvarium appears intact. Sinuses/Orbits: Mucosal thickening noted in several ethmoid air cells. Other visualized paranasal sinuses are clear. Evidence of previous cataract removal on the right. Orbits otherwise appear symmetric bilaterally. Other: Mastoid air cells are  clear. IMPRESSION: Normal appearing brain parenchyma. No evident acute infarct. No mass or hemorrhage. Mild arterial vascular calcification noted. Mucosal thickening noted in several ethmoid air cells. Electronically Signed   By: Lowella Grip III M.D.   On: 08/22/2020 14:51    Assessment / Plan:   Assessment: 1.  Enterocolitis: Continued abdominal distention, question of this is causing abdominal pain and symptoms, uncertain etiology,  rising white count 2.  Nausea and vomiting: Some better 3.  Transaminitis: Trended down yesterday, now trending back upward 4.  Diarrhea 5.  Leukocytosis: Worsening overnight with a fever 6.  Severe headache  Plan: 1.  Uncertain etiology of all the patient's symptoms. 2.  Agree with consult to infectious disease to see if they have any other thoughts. 3.  Please await any further recommendations from Dr. Tarri Glenn later today.  Thank you for your kind consultation, we will continue to follow.    LOS: 4 days   Levin Erp  08/24/2020, 12:03 PM

## 2020-08-24 NOTE — Progress Notes (Signed)
PROGRESS NOTE    Dwayne Young  YCX:[id number] DOB: September 23, 1951 DOA: 08/20/2020 PCP: Susy Frizzle, MD    Brief Narrative:  69 year old gentleman with history of hyperlipidemia, no other major medical problems admitted to the hospital with 2 weeks of nausea, vomiting and diarrhea, headache as well as temperature of 102 at home.  Found to have gastroenteritis with dehydration and admitted to the hospital.   Assessment & Plan:   Principal Problem:   Diverticulitis Active Problems:   Hyperlipidemia   Nausea & vomiting   Abdominal pain   Diarrhea   Transaminitis   LFT elevation   Abnormal CT scan, gastrointestinal tract  Acute abdominal pain/acute diverticulitis and enteritis: Suspect viral gastroenteritis with meningism. CT scan abdomen pelvis consistent with enteritis and diverticulitis.  Symptoms ongoing for almost 2 weeks. Gradually improving.  Suspect viral gastroenteritis, less likely Salmonella.  Blood cultures negative so far. Normal bowel function, advance to soft diet today. Lymphocytosis with normal neutrophils, likely bacterial infection.  Likely CMV infection. Discontinue all antibiotics and monitor. Recheck electrolytes, liver function test and CBC with differential in the morning. Case discussed with infectious disease, recommended symptomatic treatment without antibiotics. May need a follow-up colonoscopy in future. Appreciate GI input.  Fever, headache, systemic prodrome: Likely viral syndrome.  No evidence of CNS infection.  Symptomatic treatment.  Abnormal LFTs: Stabilizing.  Right upper quadrant ultrasound was normal with some sludge in the gallbladder.  No cholecystitis. Trending down.  Advance activities.  Advance diet.  Probable discharge tomorrow with close follow-up if feeling well.   DVT prophylaxis: SCDs Start: 08/20/20 2335   Code Status: Full code Family Communication: Wife at the bedside Disposition Plan: Status is: Inpatient  Remains  inpatient appropriate because:IV treatments appropriate due to intensity of illness or inability to take PO and Inpatient level of care appropriate due to severity of illness   Dispo: The patient is from: Home              Anticipated d/c is to: Home              Anticipated d/c date is: 2 days              Patient currently is not medically stable to d/c.         Consultants:   Gastroenterology  Infectious disease  Procedures:   None  Antimicrobials:  Antibiotics Given (last 72 hours)    Date/Time Action Medication Dose Rate   08/21/20 2131 New Bag/Given   ciprofloxacin (CIPRO) IVPB 400 mg 400 mg 200 mL/hr   08/22/20 0028 New Bag/Given   metroNIDAZOLE (FLAGYL) IVPB 500 mg 500 mg 100 mL/hr   08/22/20 0524 New Bag/Given   metroNIDAZOLE (FLAGYL) IVPB 500 mg 500 mg 100 mL/hr   08/22/20 1229 New Bag/Given  [loss of iv]   ciprofloxacin (CIPRO) IVPB 400 mg 400 mg 200 mL/hr   08/22/20 1516 New Bag/Given   metroNIDAZOLE (FLAGYL) IVPB 500 mg 500 mg 100 mL/hr   08/22/20 2040 New Bag/Given   metroNIDAZOLE (FLAGYL) IVPB 500 mg 500 mg 100 mL/hr   08/22/20 2209 New Bag/Given   ciprofloxacin (CIPRO) IVPB 400 mg 400 mg 200 mL/hr   08/23/20 0612 New Bag/Given   metroNIDAZOLE (FLAGYL) IVPB 500 mg 500 mg 100 mL/hr   08/23/20 1057 New Bag/Given   ciprofloxacin (CIPRO) IVPB 400 mg 400 mg 200 mL/hr   08/23/20 1359 New Bag/Given   metroNIDAZOLE (FLAGYL) IVPB 500 mg 500 mg 100 mL/hr   08/23/20 2114  New Bag/Given   metroNIDAZOLE (FLAGYL) IVPB 500 mg 500 mg 100 mL/hr   08/23/20 2221 New Bag/Given   ciprofloxacin (CIPRO) IVPB 400 mg 400 mg 200 mL/hr   08/24/20 0527 New Bag/Given   metroNIDAZOLE (FLAGYL) IVPB 500 mg 500 mg 100 mL/hr   08/24/20 1110 New Bag/Given   ciprofloxacin (CIPRO) IVPB 400 mg 400 mg 200 mL/hr         Subjective: Patient seen and examined.  He just frustrated with events otherwise denies any nausea vomiting.  He has done full liquid diet and has no issues.  No  bowel movement but passing flatus. He had one episode of low-grade temperature 99 point 8 at night and bandlike headache that bothered him.  Using Dilaudid for headache as he cannot do NSAIDs and was avoiding Tylenol.  Objective: Vitals:   08/23/20 1346 08/23/20 2213 08/24/20 0611 08/24/20 1352  BP: 122/77 (!) 96/42 131/81 128/76  Pulse: 90 93 100 88  Resp: 18 18 17 17   Temp: 98.5 F (36.9 C) 99.8 F (37.7 C) 99.3 F (37.4 C) 98.9 F (37.2 C)  TempSrc: Oral Oral Oral Oral  SpO2: 97% 91% 94% 97%  Weight:      Height:        Intake/Output Summary (Last 24 hours) at 08/24/2020 1506 Last data filed at 08/24/2020 0611 Gross per 24 hour  Intake 2251.69 ml  Output 0 ml  Net 2251.69 ml   Filed Weights   08/21/20 1202  Weight: 91.1 kg    Examination:  General exam: Appears calm and comfortable  Respiratory system: Clear to auscultation. Respiratory effort normal. Cardiovascular system: S1 & S2 heard, RRR.  Gastrointestinal system: Nontender.  No rigidity or guarding.  No localized tenderness. Central nervous system: Alert and oriented. No focal neurological deficits. Extremities: Symmetric 5 x 5 power. Skin: No rashes, lesions or ulcers Psychiatry: Judgement and insight appear normal. Mood & affect appropriate.     Data Reviewed: I have personally reviewed following labs and imaging studies  CBC: Recent Labs  Lab 08/20/20 1748 08/21/20 0415 08/22/20 0542 08/23/20 0510 08/24/20 0452  WBC 15.7* 16.7* 16.2* 19.5* 20.7*  NEUTROABS  --  4.8  --   --  4.4  HGB 15.5 12.8* 13.3 14.1 13.2  HCT 46.8 39.4 40.8 43.5 40.5  MCV 91.6 92.7 93.6 93.3 92.5  PLT 199 163 169 179 371   Basic Metabolic Panel: Recent Labs  Lab 08/20/20 1748 08/21/20 0415 08/21/20 1003 08/22/20 0542 08/23/20 0510 08/24/20 0452  NA 134* 133*  --  131* 130* 131*  K 4.3 4.7  --  4.3 4.3 4.9  CL 99 102  --  102 99 101  CO2 26 22  --  23 26 24   GLUCOSE 110* 105*  --  109* 110* 108*  BUN 16 16   --  15 13 12   CREATININE 1.38* 1.25*  --  1.25* 1.32* 1.29*  CALCIUM 8.4* 7.8*  --  7.6* 7.7* 7.6*  MG  --  1.9 2.0  --   --   --    GFR: Estimated Creatinine Clearance: 63.7 mL/min (A) (by C-G formula based on SCr of 1.29 mg/dL (H)). Liver Function Tests: Recent Labs  Lab 08/20/20 1748 08/21/20 0415 08/22/20 0542 08/23/20 0510 08/24/20 0452  AST 280* 254* 415* 251* 134*  ALT 297* 238* 319* 262* 180*  ALKPHOS 226* 186* 171* 171* 144*  BILITOT 1.0 0.9 0.6 0.6 0.6  PROT 6.4* 5.1* 5.1* 5.6* 5.1*  ALBUMIN  3.2* 2.6* 2.4* 2.6* 2.4*   Recent Labs  Lab 08/20/20 1748  LIPASE 27   No results for input(s): AMMONIA in the last 168 hours. Coagulation Profile: Recent Labs  Lab 08/21/20 0415  INR 1.0   Cardiac Enzymes: No results for input(s): CKTOTAL, CKMB, CKMBINDEX, TROPONINI in the last 168 hours. BNP (last 3 results) No results for input(s): PROBNP in the last 8760 hours. HbA1C: No results for input(s): HGBA1C in the last 72 hours. CBG: No results for input(s): GLUCAP in the last 168 hours. Lipid Profile: No results for input(s): CHOL, HDL, LDLCALC, TRIG, CHOLHDL, LDLDIRECT in the last 72 hours. Thyroid Function Tests: No results for input(s): TSH, T4TOTAL, FREET4, T3FREE, THYROIDAB in the last 72 hours. Anemia Panel: No results for input(s): VITAMINB12, FOLATE, FERRITIN, TIBC, IRON, RETICCTPCT in the last 72 hours. Sepsis Labs: Recent Labs  Lab 08/20/20 2005  LATICACIDVEN 1.6    Recent Results (from the past 240 hour(s))  Blood culture (routine x 2)     Status: None (Preliminary result)   Collection Time: 08/20/20  7:50 PM   Specimen: BLOOD  Result Value Ref Range Status   Specimen Description   Final    BLOOD LEFT ANTECUBITAL Performed at [facility], [place] [address] [place], [place] [address]    Special Requests   Final    BOTTLES DRAWN AEROBIC AND ANAEROBIC Blood Culture adequate volume Performed at [facility] [address], [place], [place] [address]    Culture   Final    NO GROWTH 4 DAYS Performed at [facility], [place] [address] [place], [place] [address]    Report Status PENDING  Incomplete  Blood culture (routine x 2)     Status: None (Preliminary result)   Collection Time: 08/20/20  8:05 PM   Specimen: BLOOD  Result Value Ref Range Status   Specimen Description   Final    BLOOD RIGHT ANTECUBITAL Performed at [facility] [address], [place], [place] [address]    Special Requests   Final    BOTTLES DRAWN AEROBIC AND ANAEROBIC Blood Culture adequate volume Performed at [facility] [address] [place], [place] [address]    Culture   Final    NO GROWTH 4 DAYS Performed at [facility], [place] [address], [place], [place] [address]    Report Status PENDING  Incomplete  Respiratory Panel by RT PCR (Flu A&B, Covid) - Nasopharyngeal Swab     Status: None   Collection Time: 08/20/20  8:13 PM   Specimen: Nasopharyngeal Swab  Result Value Ref Range Status   SARS Coronavirus 2 by RT PCR NEGATIVE NEGATIVE Final    Comment: (NOTE) SARS-CoV-2 target nucleic acids are NOT DETECTED.  The SARS-CoV-2 RNA is generally detectable in upper respiratoy specimens during the acute phase of infection. The lowest concentration of SARS-CoV-2 viral copies this assay can detect is 131 copies/mL. A negative result does not preclude SARS-Cov-2 infection and should not be used as the sole basis for treatment or other patient management decisions. A negative result may occur with  improper specimen collection/handling, submission of specimen other than nasopharyngeal swab, presence of viral mutation(s) within the areas targeted by this assay, and inadequate number of viral copies (<131 copies/mL). A negative result must be combined with clinical observations, patient history, and epidemiological information. The expected result is Negative.  Fact Sheet  for Patients:  PinkCheek.be  Fact Sheet for Healthcare  Providers:  GravelBags.it  This test is no t yet approved or cleared by the [place] and  has been authorized for detection and/or diagnosis of SARS-CoV-2 by FDA under an Emergency Use Authorization (EUA). This EUA will remain  in effect (meaning this test can be used) for the duration of the COVID-19 declaration under Section 564(b)(1) of the Act, 21 U.S.C. section 360bbb-3(b)(1), unless the authorization is terminated or revoked sooner.     Influenza A by PCR NEGATIVE NEGATIVE Final   Influenza B by PCR NEGATIVE NEGATIVE Final    Comment: (NOTE) The Xpert Xpress SARS-CoV-2/FLU/RSV assay is intended as an aid in  the diagnosis of influenza from Nasopharyngeal swab specimens and  should not be used as a sole basis for treatment. Nasal washings and  aspirates are unacceptable for Xpert Xpress SARS-CoV-2/FLU/RSV  testing.  Fact Sheet for Patients: PinkCheek.be  Fact Sheet for Healthcare Providers: GravelBags.it  This test is not yet approved or cleared by the [place] FDA and  has been authorized for detection and/or diagnosis of SARS-CoV-2 by  FDA under an Emergency Use Authorization (EUA). This EUA will remain  in effect (meaning this test can be used) for the duration of the  Covid-19 declaration under Section 564(b)(1) of the Act, 21  U.S.C. section 360bbb-3(b)(1), unless the authorization is  terminated or revoked. Performed at [facility], [place] [address] [place], [place] [address]          Radiology Studies: No results found.      Scheduled Meds:  Difluprednate  1 drop Left Eye TID   gatifloxacin  1 drop Right Eye TID   ketorolac  1 drop Left Eye QHS   Continuous Infusions:  lactated ringers 75 mL/hr at 08/23/20 1800     LOS: 4 days    Time  spent: 25 minutes    Barb Merino, MD [facility] Pager [phone]

## 2020-08-24 NOTE — Consult Note (Signed)
[facility]       Reason for Consult: leukocytosis    Referring Physician: Dr. Sloan Leiter  Principal Problem:   Diverticulitis Active Problems:   Hyperlipidemia   Nausea & vomiting   Abdominal pain   Diarrhea   Transaminitis   LFT elevation   Abnormal CT scan, gastrointestinal tract   . Difluprednate  1 drop Left Eye TID  . gatifloxacin  1 drop Right Eye TID  . ketorolac  1 drop Left Eye QHS    Recommendations: Stop antibiotics Will check CMV and EBV  Continue supportive care  Assessment: He has over 2 weeks of symptoms that started as GI symptoms and fever up to 102 and slowly improving. This is associated with transaminitis.  This is most c/w CMV-like illness.  He has been on antibiotics for a prolonged period including prior to hospitalization and with above symptoms, lymphocytosis and improvement overall, no indication for antibiotics and these have been stopped. I suspect he continues to feel poorly to some degree due to the antibiotics.   He should follow up with his PCP next week to recheck his LFTs and remain off the statin until then.  Ok from [facility] standpoint for discharge.    Antibiotics: cipro + flagyl > 1 weeks  HPI: Dwayne Young is a 69 y.o. male with hyperlipidemia came in with abdominal pain.  Some diarrhea, vomiting at home.  Fever to 102.  Started as an outpatient with cipro and flagyl with concern for colitis but did not improve and came in to the ED.  LFTs up, CT with possible small area of diverticulitis.  No fever since admission.  Has had leukocytosis with a lymphocytic predominance.     Review of Systems:  Constitutional: positive for chills, fatigue and malaise or negative for anorexia Genitourinary: negative for frequency and dysuria Integument/breast: negative for rash Hematologic/lymphatic: negative for lymphadenopathy Musculoskeletal: negative for myalgias and arthralgias Neurological: negative for  meningismus All other systems reviewed and are negative    Past Medical History:  Diagnosis Date  . Arthritis    arthritis-generalized-knees, elbows  . Elevated PSA   . Hypercholesteremia   . Hyperlipidemia   . Prostate cancer ([place])    dx. prostate cancer after bx. 2'15-now surgery planned  . Retinal detachment   . Seasonal allergies     Social History   Tobacco Use  . Smoking status: Former Smoker    Packs/day: 0.50    Years: 15.00    Pack years: 7.50    Types: Cigarettes    Quit date: 04/27/1989    Years since quitting: 31.3  . Smokeless tobacco: Never Used  Substance Use Topics  . Alcohol use: Yes    Alcohol/week: 1.0 standard drink    Types: 1 Glasses of wine per week    Comment: moderate - wine every 3 rd day  . Drug use: No    History reviewed. No pertinent family history.  No Known Allergies  Physical Exam: Constitutional: in no apparent distress  Vitals:   08/23/20 2213 08/24/20 0611  BP: (!) 96/42 131/81  Pulse: 93 100  Resp: 18 17  Temp: 99.8 F (37.7 C) 99.3 F (37.4 C)  SpO2: 91% 94%   EYES: anicteric Cardiovascular: Cor RRR Respiratory: clear; GI: Bowel sounds are normal, liver is not enlarged, spleen is not enlarged Musculoskeletal: no pedal edema noted Skin: negatives: no rash Neuro: non-focal  Lab Results  Component Value Date   WBC 20.7 (H)  08/24/2020   HGB 13.2 08/24/2020   HCT 40.5 08/24/2020   MCV 92.5 08/24/2020   PLT 154 08/24/2020    Lab Results  Component Value Date   CREATININE 1.29 (H) 08/24/2020   BUN 12 08/24/2020   NA 131 (L) 08/24/2020   K 4.9 08/24/2020   CL 101 08/24/2020   CO2 24 08/24/2020    Lab Results  Component Value Date   ALT 180 (H) 08/24/2020   AST 134 (H) 08/24/2020   GGT 117 (H) 08/21/2020   ALKPHOS 144 (H) 08/24/2020     Microbiology: Recent Results (from the past 240 hour(s))  Blood culture (routine x 2)     Status: None (Preliminary result)   Collection Time: 08/20/20  7:50 PM    Specimen: BLOOD  Result Value Ref Range Status   Specimen Description   Final    BLOOD LEFT ANTECUBITAL Performed at [facility], [place] [address], [place], [place] [address]    Special Requests   Final    BOTTLES DRAWN AEROBIC AND ANAEROBIC Blood Culture adequate volume Performed at [facility] [address] [place], [place] [address]    Culture   Final    NO GROWTH 4 DAYS Performed at [facility], [place] [address] [place], [place] [address]    Report Status PENDING  Incomplete  Blood culture (routine x 2)     Status: None (Preliminary result)   Collection Time: 08/20/20  8:05 PM   Specimen: BLOOD  Result Value Ref Range Status   Specimen Description   Final    BLOOD RIGHT ANTECUBITAL Performed at [facility] [address] [place], [place] [address]    Special Requests   Final    BOTTLES DRAWN AEROBIC AND ANAEROBIC Blood Culture adequate volume Performed at [facility] [address], [place], [place] [address]    Culture   Final    NO GROWTH 4 DAYS Performed at [facility], [place] [address] [place], [place] [address]    Report Status PENDING  Incomplete  Respiratory Panel by RT PCR (Flu A&B, Covid) - Nasopharyngeal Swab     Status: None   Collection Time: 08/20/20  8:13 PM   Specimen: Nasopharyngeal Swab  Result Value Ref Range Status   SARS Coronavirus 2 by RT PCR NEGATIVE NEGATIVE Final    Comment: (NOTE) SARS-CoV-2 target nucleic acids are NOT DETECTED.  The SARS-CoV-2 RNA is generally detectable in upper respiratoy specimens during the acute phase of infection. The lowest concentration of SARS-CoV-2 viral copies this assay can detect is 131 copies/mL. A negative result does not preclude SARS-Cov-2 infection and should not be used as the sole basis for treatment or other patient management decisions. A negative result may occur with  improper specimen  collection/handling, submission of specimen other than nasopharyngeal swab, presence of viral mutation(s) within the areas targeted by this assay, and inadequate number of viral copies (<131 copies/mL). A negative result must be combined with clinical observations, patient history, and epidemiological information. The expected result is Negative.  Fact Sheet for Patients:  PinkCheek.be  Fact Sheet for Healthcare Providers:  GravelBags.it  This test is no t yet approved or cleared by the [place] FDA and  has been authorized for detection and/or diagnosis of SARS-CoV-2 by FDA under an Emergency Use Authorization (EUA). This EUA will remain  in effect (meaning this test can be used) for the duration of the COVID-19 declaration  under Section 564(b)(1) of the Act, 21 U.S.C. section 360bbb-3(b)(1), unless the authorization is terminated or revoked sooner.     Influenza A by PCR NEGATIVE NEGATIVE Final   Influenza B by PCR NEGATIVE NEGATIVE Final    Comment: (NOTE) The Xpert Xpress SARS-CoV-2/FLU/RSV assay is intended as an aid in  the diagnosis of influenza from Nasopharyngeal swab specimens and  should not be used as a sole basis for treatment. Nasal washings and  aspirates are unacceptable for Xpert Xpress SARS-CoV-2/FLU/RSV  testing.  Fact Sheet for Patients: PinkCheek.be  Fact Sheet for Healthcare Providers: GravelBags.it  This test is not yet approved or cleared by the [place] FDA and  has been authorized for detection and/or diagnosis of SARS-CoV-2 by  FDA under an Emergency Use Authorization (EUA). This EUA will remain  in effect (meaning this test can be used) for the duration of the  Covid-19 declaration under Section 564(b)(1) of the Act, 21  U.S.C. section 360bbb-3(b)(1), unless the authorization is  terminated or revoked. Performed at [facility], [place] [address] [place], [place] [address]     Eyanna Mcgonagle W Lannis Lichtenwalner, MD [facility] [email or web address] 08/24/2020, 1:45 PM

## 2020-08-25 DIAGNOSIS — K5792 Diverticulitis of intestine, part unspecified, without perforation or abscess without bleeding: Secondary | ICD-10-CM | POA: Diagnosis not present

## 2020-08-25 LAB — ROCKY MTN SPOTTED FVR ABS PNL(IGG+IGM)
RMSF IgG: NOT DETECTED
RMSF IgM: NOT DETECTED

## 2020-08-25 LAB — CULTURE, BLOOD (ROUTINE X 2)
Culture: NO GROWTH
Culture: NO GROWTH
Special Requests: ADEQUATE
Special Requests: ADEQUATE

## 2020-08-25 LAB — CBC WITH DIFFERENTIAL/PLATELET
Abs Immature Granulocytes: 0.08 10*3/uL — ABNORMAL HIGH (ref 0.00–0.07)
Basophils Absolute: 0.3 10*3/uL — ABNORMAL HIGH (ref 0.0–0.1)
Basophils Relative: 2 %
Eosinophils Absolute: 0.2 10*3/uL (ref 0.0–0.5)
Eosinophils Relative: 1 %
HCT: 41.1 % (ref 39.0–52.0)
Hemoglobin: 13.5 g/dL (ref 13.0–17.0)
Immature Granulocytes: 1 %
Lymphocytes Relative: 61 %
Lymphs Abs: 10.8 10*3/uL — ABNORMAL HIGH (ref 0.7–4.0)
MCH: 30.5 pg (ref 26.0–34.0)
MCHC: 32.8 g/dL (ref 30.0–36.0)
MCV: 92.8 fL (ref 80.0–100.0)
Monocytes Absolute: 2 10*3/uL — ABNORMAL HIGH (ref 0.1–1.0)
Monocytes Relative: 11 %
Neutro Abs: 4.2 10*3/uL (ref 1.7–7.7)
Neutrophils Relative %: 24 %
Platelets: 150 10*3/uL (ref 150–400)
RBC: 4.43 MIL/uL (ref 4.22–5.81)
RDW: 14.1 % (ref 11.5–15.5)
WBC: 17.5 10*3/uL — ABNORMAL HIGH (ref 4.0–10.5)
nRBC: 0 % (ref 0.0–0.2)

## 2020-08-25 LAB — B. BURGDORFI ANTIBODIES BY WB
B burgdorferi IgG Abs (IB): NEGATIVE
B burgdorferi IgM Abs (IB): NEGATIVE
Lyme Disease 18 kD IgG: NONREACTIVE
Lyme Disease 23 kD IgG: NONREACTIVE
Lyme Disease 23 kD IgM: NONREACTIVE
Lyme Disease 28 kD IgG: NONREACTIVE
Lyme Disease 30 kD IgG: NONREACTIVE
Lyme Disease 39 kD IgG: NONREACTIVE
Lyme Disease 39 kD IgM: NONREACTIVE
Lyme Disease 41 kD IgG: REACTIVE — AB
Lyme Disease 41 kD IgM: REACTIVE — AB
Lyme Disease 45 kD IgG: NONREACTIVE
Lyme Disease 58 kD IgG: NONREACTIVE
Lyme Disease 66 kD IgG: NONREACTIVE
Lyme Disease 93 kD IgG: NONREACTIVE

## 2020-08-25 LAB — COMPREHENSIVE METABOLIC PANEL
ALT: 140 U/L — ABNORMAL HIGH (ref 0–44)
AST: 101 U/L — ABNORMAL HIGH (ref 15–41)
Albumin: 2.3 g/dL — ABNORMAL LOW (ref 3.5–5.0)
Alkaline Phosphatase: 138 U/L — ABNORMAL HIGH (ref 38–126)
Anion gap: 6 (ref 5–15)
BUN: 13 mg/dL (ref 8–23)
CO2: 24 mmol/L (ref 22–32)
Calcium: 7.6 mg/dL — ABNORMAL LOW (ref 8.9–10.3)
Chloride: 102 mmol/L (ref 98–111)
Creatinine, Ser: 1.25 mg/dL — ABNORMAL HIGH (ref 0.61–1.24)
GFR, Estimated: >60 mL/min (ref >60)
Glucose, Bld: 106 mg/dL — ABNORMAL HIGH (ref 70–99)
Potassium: 4.8 mmol/L (ref 3.5–5.1)
Sodium: 132 mmol/L — ABNORMAL LOW (ref 135–145)
Total Bilirubin: 0.6 mg/dL (ref 0.3–1.2)
Total Protein: 5.2 g/dL — ABNORMAL LOW (ref 6.5–8.1)

## 2020-08-25 LAB — PATHOLOGIST SMEAR REVIEW

## 2020-08-25 MED ORDER — ACETAMINOPHEN ER 650 MG PO TBCR: 650.0000 mg | EXTENDED_RELEASE_TABLET | Freq: Three times a day (TID) | ORAL | Status: DC | PRN

## 2020-08-25 MED ORDER — ACETAMINOPHEN 325 MG PO TABS: 650.0000 mg | ORAL_TABLET | Freq: Two times a day (BID) | ORAL | Status: DC | PRN

## 2020-08-25 MED ORDER — CYCLOBENZAPRINE HCL 5 MG PO TABS: 5.0000 mg | ORAL_TABLET | Freq: Three times a day (TID) | ORAL | Status: DC | PRN

## 2020-08-25 MED ORDER — POLYETHYLENE GLYCOL 3350 17 G PO PACK
17.0000 g | PACK | Freq: Once | ORAL | Status: AC
  Administered 2020-08-25: 17 g via ORAL
  Filled 2020-08-25: qty 1

## 2020-08-25 NOTE — Progress Notes (Signed)
Discharge orders discussed with patient and family, verbalized agreement and understanding

## 2020-08-25 NOTE — Discharge Summary (Signed)
Physician Discharge Summary  Dwayne Young IHK:[id number] DOB: 01/17/1951 DOA: 08/20/2020  PCP: Susy Frizzle, MD  Admit date: 08/20/2020 Discharge date: 08/25/2020  Admitted From: Home Disposition: Home  Recommendations for Outpatient Follow-up:  1. Follow up with PCP in 1-2 weeks 2. Recheck CBC with differential and CMP in about 1 week.  Home Health: Not applicable Equipment/Devices: Not applicable  Discharge Condition: Stable CODE STATUS: Full code Diet recommendation: Regular diet, plenty of liquids and laxatives.  Discharge summary: 69 year old gentleman with history of hyperlipidemia, no other major medical issues admitted to the hospital with 2 weeks of nausea, vomiting, diarrhea headache and reported temperature of 102 at home.  In the emergency room he was afebrile.  He had lymphocytic leukocytosis and a CT scan of the abdomen and pelvis showed nonspecific enteritis and diverticulitis.  He was treated with Cipro and Flagyl as outpatient.  He had 3 days of severe symptoms and was not able to eat or drink well.  Patient was admitted to the hospital and treated symptomatically with IV fluid, initially n.p.o.  He was initially treated with ciprofloxacin and Flagyl IV that was ultimately stopped.  He stopped having diarrhea since coming to the hospital instead no bowel movement without laxatives.  With symptomatic treatment, his abdominal pain and distention gradually improved.  He has mild distention present.  He is able to tolerate soft diet with no nausea or vomiting. Patient had mildly elevated LFTs consistent with viral infection. Patient also had episodic headache and low-grade temperature consistent with viral syndrome.  Assessment and plan: Suspected viral gastroenteritis with viral prodrome, CMV and EBV serology pending.  Seen by GI and ID. LFTs trending down. Discharged home, adequate nutrition and hydration. Decrease dose of Tylenol, will use less than 2 g a  day. Will need recheck LFT in about a week to ensure stabilization and normalization. Have discontinued his statin until symptoms improve.  Discharge Diagnoses:  Principal Problem:   Diverticulitis Active Problems:   Hyperlipidemia   Nausea & vomiting   Abdominal pain   Diarrhea   Transaminitis   LFT elevation   Abnormal CT scan, gastrointestinal tract    Discharge Instructions  Discharge Instructions    Call MD for:  persistant nausea and vomiting   Complete by: As directed    Diet - low sodium heart healthy   Complete by: As directed    Discharge instructions   Complete by: As directed    Take miralax and other laxatives to have smooth bowel movement   Increase activity slowly   Complete by: As directed      Allergies as of 08/25/2020   No Known Allergies     Medication List    STOP taking these medications   ciprofloxacin 500 MG tablet Commonly known as: Cipro   metroNIDAZOLE 500 MG tablet Commonly known as: FLAGYL   rosuvastatin 5 MG tablet Commonly known as: CRESTOR   tiZANidine 4 MG tablet Commonly known as: Zanaflex     TAKE these medications   acetaminophen 650 MG CR tablet Commonly known as: TYLENOL Take 1 tablet (650 mg total) by mouth every 8 (eight) hours as needed for pain. What changed: how much to take   Besivance 0.6 % Susp Generic drug: Besifloxacin HCl Place 1 drop into the right eye 3 (three) times daily.   cyclobenzaprine 10 MG tablet Commonly known as: FLEXERIL Take 1 tablet (10 mg total) by mouth 3 (three) times daily as needed for muscle spasms.   Durezol  0.05 % Emul Generic drug: Difluprednate Place 1 drop into the left eye 3 (three) times daily.   Fish Oil 500 MG Caps Take 500 mg by mouth daily.   LORazepam 1 MG tablet Commonly known as: ATIVAN TAKE (1) TABLET BY MOUTH TWICE DAILY AS NEEDED FOR ANXIETY. What changed:   how much to take  how to take this  when to take this  reasons to take this  additional  instructions   Prolensa 0.07 % Soln Generic drug: Bromfenac Sodium Place 1 drop into the left eye at bedtime.   Voltaren 1 % Gel Generic drug: diclofenac Sodium Apply 1 application topically daily as needed (pain).       Follow-up Information    Susy Frizzle, MD Follow up in 1 week(s).   Specialty: Family Medicine Why: please check CBC with Diff and CMP  Contact information: [address] [place] [address] [phone]              No Known Allergies  Consultations:  Gastroenterology  Infectious disease   Procedures/Studies: DG Abd 1 View  Result Date: 08/22/2020 CLINICAL DATA:  Abdominal distension. EXAM: ABDOMEN - 1 VIEW COMPARISON:  None. FINDINGS: The bowel gas pattern is normal. No radio-opaque calculi or other significant radiographic abnormality are seen. IMPRESSION: Negative. Electronically Signed   By: Marijo Conception M.D.   On: 08/22/2020 14:17   CT HEAD WO CONTRAST  Result Date: 08/22/2020 CLINICAL DATA:  Headache with nausea and vomiting for approximately 10 days EXAM: CT HEAD WITHOUT CONTRAST TECHNIQUE: Contiguous axial images were obtained from the base of the skull through the vertex without intravenous contrast. COMPARISON:  None. FINDINGS: Brain: Ventricles and sulci are normal in size and configuration. There is no intracranial mass, hemorrhage, extra-axial fluid collection, or midline shift. The brain parenchyma appears unremarkable. There is no evident acute infarct. Vascular: There is no hyperdense vessel. There is mild calcification in each carotid siphon region. Skull: The bony calvarium appears intact. Sinuses/Orbits: Mucosal thickening noted in several ethmoid air cells. Other visualized paranasal sinuses are clear. Evidence of previous cataract removal on the right. Orbits otherwise appear symmetric bilaterally. Other: Mastoid air cells are clear. IMPRESSION: Normal appearing brain parenchyma. No evident acute infarct. No mass or  hemorrhage. Mild arterial vascular calcification noted. Mucosal thickening noted in several ethmoid air cells. Electronically Signed   By: Lowella Grip III M.D.   On: 08/22/2020 14:51   CT ABDOMEN PELVIS W CONTRAST  Result Date: 08/20/2020 CLINICAL DATA:  Abdominal pain and fever EXAM: CT ABDOMEN AND PELVIS WITH CONTRAST TECHNIQUE: Multidetector CT imaging of the abdomen and pelvis was performed using the standard protocol following bolus administration of intravenous contrast. CONTRAST:  152mL OMNIPAQUE IOHEXOL 300 MG/ML  SOLN COMPARISON:  None. FINDINGS: Lower chest: The visualized heart size within normal limits. No pericardial fluid/thickening. No hiatal hernia. The visualized portions of the lungs are clear. Hepatobiliary: The liver is normal in density without focal abnormality.The main portal vein is patent. No evidence of calcified gallstones, gallbladder wall thickening or biliary dilatation. Pancreas: Unremarkable. No pancreatic ductal dilatation or surrounding inflammatory changes. Spleen: Normal in size without focal abnormality. Adrenals/Urinary Tract: Both adrenal glands appear normal. Again noted are right-sided parapelvic cyst. The kidneys and collecting system appear normal without evidence of urinary tract calculus or hydronephrosis. Bladder is unremarkable. Stomach/Bowel: The stomach is unremarkable. There are several loops distal ileum within the left lower quadrant which appear to have hyperenhancement and surrounding  mild fat stranding changes. No loculated fluid collections are seen. There is also a focal segment of sigmoid colon within the right lower pelvis, series 2, image 72 with diffuse wall thickening and surrounding fat stranding changes. In mild inflammatory changes extend within the bilateral pericolic gutters. No definite free air is seen. Moderate amount of colonic stool is present. Vascular/Lymphatic: There are no enlarged mesenteric, retroperitoneal, or pelvic lymph  nodes. Scattered aortic atherosclerotic calcifications are seen without aneurysmal dilatation. Reproductive: The patient is status post prostatectomy. Other: No evidence of abdominal wall mass or hernia. Musculoskeletal: No acute or significant osseous findings. IMPRESSION: Mild wall thickening and inflammatory changes around distal ileal loops and sigmoid colon which could represent scattered areas of enteritis and diverticulitis. No loculated fluid collections or free air. Mild inflammatory changes seen within the bilateral pericolic gutters. Aortic Atherosclerosis (ICD10-I70.0). Electronically Signed   By: Prudencio Pair M.D.   On: 08/20/2020 22:30   DG Chest Portable 1 View  Result Date: 08/20/2020 CLINICAL DATA:  Cough EXAM: PORTABLE CHEST 1 VIEW COMPARISON:  April 08, 2016 FINDINGS: There is no definite acute cardiopulmonary process. The heart size is unremarkable. There is no pneumothorax or pleural effusion. There is a rounded 1 cm density in the left mid lung zone that appears to be new since 2017. IMPRESSION: 1. No definite acute cardiopulmonary process. 2. Rounded density in the left mid lung zone. A 4-6 week follow-up two-view chest x-ray is recommended as an outpatient to confirm stability or resolution of this finding. Electronically Signed   By: Constance Holster M.D.   On: 08/20/2020 20:36   US Abdomen Limited RUQ (LIVER/GB)  Result Date: 08/20/2020 CLINICAL DATA:  Initial evaluation for elevated LFTs. History of prostate cancer. EXAM: ULTRASOUND ABDOMEN LIMITED RIGHT UPPER QUADRANT COMPARISON:  None available. FINDINGS: Gallbladder: Layering echogenic sludge present within the gallbladder lumen. No frank shadowing stones. Gallbladder wall measures at the upper limits of normal at 3 mm. No free pericholecystic fluid. No sonographic Murphy sign elicited on exam. Common bile duct: Diameter: 2.4 mm Liver: Liver demonstrates a somewhat coarsened echotexture with nodular contour. No focal  intrahepatic lesions. Portal vein is patent on color Doppler imaging with normal direction of blood flow towards the liver. Other: Incidental note made of a right pleural effusion. IMPRESSION: 1. Gallbladder sludge without cholelithiasis or other sonographic features to suggest acute cholecystitis. 2. No biliary dilatation. 3. Coarse echotexture of the liver with nodular contour, suggesting possible cirrhosis. No focal intrahepatic lesions. 4. Right pleural effusion. Electronically Signed   By: Jeannine Boga M.D.   On: 08/20/2020 19:57   (Echo, Carotid, EGD, Colonoscopy, ERCP)    Subjective: Patient seen and examined.  Still gets episodic mild headache.  Temperature maximum 99 in last 24 hours. He has no nausea or vomiting.  He has been doing full liquid diet and soft food without trouble. No bowel movement for the last 48 hours, mildly bloated, passing flatus. Eager to go home and recover.   Discharge Exam: Vitals:   08/25/20 0509 08/25/20 1400  BP: 137/84 128/80  Pulse: 94 95  Resp: 18 18  Temp: 98.4 F (36.9 C) 98.5 F (36.9 C)  SpO2: 94% 99%   Vitals:   08/24/20 1352 08/24/20 2105 08/25/20 0509 08/25/20 1400  BP: 128/76 118/75 137/84 128/80  Pulse: 88 98 94 95  Resp: 17 18 18 18   Temp: 98.9 F (37.2 C) 98.9 F (37.2 C) 98.4 F (36.9 C) 98.5 F (36.9 C)  TempSrc: Oral  Oral Oral Oral  SpO2: 97% 91% 94% 99%  Weight:      Height:        General: Pt is alert, awake, not in acute distress Cardiovascular: RRR, S1/S2 +, no rubs, no gallops Respiratory: CTA bilaterally, no wheezing, no rhonchi Abdominal: Soft, NT, ND, bowel sounds +, mildly distended.  Colostomy Extremities: no edema, no cyanosis    The results of significant diagnostics from this hospitalization (including imaging, microbiology, ancillary and laboratory) are listed below for reference.     Microbiology: Recent Results (from the past 240 hour(s))  Blood culture (routine x 2)     Status: None    Collection Time: 08/20/20  7:50 PM   Specimen: BLOOD  Result Value Ref Range Status   Specimen Description   Final    BLOOD LEFT ANTECUBITAL Performed at [facility] [address] [place], [place] [address]    Special Requests   Final    BOTTLES DRAWN AEROBIC AND ANAEROBIC Blood Culture adequate volume Performed at [facility] [address], [place], [place] [address]    Culture   Final    NO GROWTH 5 DAYS Performed at [facility], [place] [address], [place], [place] [address]    Report Status 08/25/2020 FINAL  Final  Blood culture (routine x 2)     Status: None   Collection Time: 08/20/20  8:05 PM   Specimen: BLOOD  Result Value Ref Range Status   Specimen Description   Final    BLOOD RIGHT ANTECUBITAL Performed at [facility] [address], [place], [place] [address]    Special Requests   Final    BOTTLES DRAWN AEROBIC AND ANAEROBIC Blood Culture adequate volume Performed at [facility] [address] [place], [place] [address]    Culture   Final    NO GROWTH 5 DAYS Performed at [facility], [place] [address], [place], [place] [address]    Report Status 08/25/2020 FINAL  Final  Respiratory Panel by RT PCR (Flu A&B, Covid) - Nasopharyngeal Swab     Status: None   Collection Time: 08/20/20  8:13 PM   Specimen: Nasopharyngeal Swab  Result Value Ref Range Status   SARS Coronavirus 2 by RT PCR NEGATIVE NEGATIVE Final    Comment: (NOTE) SARS-CoV-2 target nucleic acids are NOT DETECTED.  The SARS-CoV-2 RNA is generally detectable in upper respiratoy specimens during the acute phase of infection. The lowest concentration of SARS-CoV-2 viral copies this assay can detect is 131 copies/mL. A negative result does not preclude SARS-Cov-2 infection and should not be used as the sole basis for treatment or other patient management decisions. A negative result may occur with  improper  specimen collection/handling, submission of specimen other than nasopharyngeal swab, presence of viral mutation(s) within the areas targeted by this assay, and inadequate number of viral copies (<131 copies/mL). A negative result must be combined with clinical observations, patient history, and epidemiological information. The expected result is Negative.  Fact Sheet for Patients:  PinkCheek.be  Fact Sheet for Healthcare Providers:  GravelBags.it  This test is no t yet approved or cleared by the [place] FDA and  has been authorized for detection and/or diagnosis of SARS-CoV-2 by FDA under an Emergency Use Authorization (EUA). This EUA will remain  in effect (meaning this test can be used) for the duration of the COVID-19 declaration under Section 564(b)(1) of the Act, 21 U.S.C. section 360bbb-3(b)(1), unless  the authorization is terminated or revoked sooner.     Influenza A by PCR NEGATIVE NEGATIVE Final   Influenza B by PCR NEGATIVE NEGATIVE Final    Comment: (NOTE) The Xpert Xpress SARS-CoV-2/FLU/RSV assay is intended as an aid in  the diagnosis of influenza from Nasopharyngeal swab specimens and  should not be used as a sole basis for treatment. Nasal washings and  aspirates are unacceptable for Xpert Xpress SARS-CoV-2/FLU/RSV  testing.  Fact Sheet for Patients: PinkCheek.be  Fact Sheet for Healthcare Providers: GravelBags.it  This test is not yet approved or cleared by the [place] FDA and  has been authorized for detection and/or diagnosis of SARS-CoV-2 by  FDA under an Emergency Use Authorization (EUA). This EUA will remain  in effect (meaning this test can be used) for the duration of the  Covid-19 declaration under Section 564(b)(1) of the Act, 21  U.S.C. section 360bbb-3(b)(1), unless the authorization is  terminated or revoked. Performed at  [facility], [place] [address] [place],  [address]      Labs: BNP (last 3 results) No results for input(s): BNP in the last 8760 hours. Basic Metabolic Panel: Recent Labs  Lab 08/21/20 0415 08/21/20 1003 08/22/20 0542 08/23/20 0510 08/24/20 0452 08/25/20 0452  NA 133*  --  131* 130* 131* 132*  K 4.7  --  4.3 4.3 4.9 4.8  CL 102  --  102 99 101 102  CO2 22  --  23 26 24 24   GLUCOSE 105*  --  109* 110* 108* 106*  BUN 16  --  15 13 12 13   CREATININE 1.25*  --  1.25* 1.32* 1.29* 1.25*  CALCIUM 7.8*  --  7.6* 7.7* 7.6* 7.6*  MG 1.9 2.0  --   --   --   --    Liver Function Tests: Recent Labs  Lab 08/21/20 0415 08/22/20 0542 08/23/20 0510 08/24/20 0452 08/25/20 0452  AST 254* 415* 251* 134* 101*  ALT 238* 319* 262* 180* 140*  ALKPHOS 186* 171* 171* 144* 138*  BILITOT 0.9 0.6 0.6 0.6 0.6  PROT 5.1* 5.1* 5.6* 5.1* 5.2*  ALBUMIN 2.6* 2.4* 2.6* 2.4* 2.3*   Recent Labs  Lab 08/20/20 1748  LIPASE 27   No results for input(s): AMMONIA in the last 168 hours. CBC: Recent Labs  Lab 08/21/20 0415 08/22/20 0542 08/23/20 0510 08/24/20 0452 08/25/20 0452  WBC 16.7* 16.2* 19.5* 20.7* 17.5*  NEUTROABS 4.8  --   --  4.4 4.2  HGB 12.8* 13.3 14.1 13.2 13.5  HCT 39.4 40.8 43.5 40.5 41.1  MCV 92.7 93.6 93.3 92.5 92.8  PLT 163 169 179 154 150   Cardiac Enzymes: No results for input(s): CKTOTAL, CKMB, CKMBINDEX, TROPONINI in the last 168 hours. BNP: Invalid input(s): POCBNP CBG: No results for input(s): GLUCAP in the last 168 hours. D-Dimer No results for input(s): DDIMER in the last 72 hours. Hgb A1c No results for input(s): HGBA1C in the last 72 hours. Lipid Profile No results for input(s): CHOL, HDL, LDLCALC, TRIG, CHOLHDL, LDLDIRECT in the last 72 hours. Thyroid function studies No results for input(s): TSH, T4TOTAL, T3FREE, THYROIDAB in the last 72 hours.  Invalid input(s): FREET3 Anemia work up No results for input(s): VITAMINB12,  FOLATE, FERRITIN, TIBC, IRON, RETICCTPCT in the last 72 hours. Urinalysis    Component Value Date/Time   COLORURINE AMBER (A) 08/20/2020 1739   APPEARANCEUR CLEAR 08/20/2020 1739   LABSPEC 1.025 08/20/2020 1739   PHURINE 5.0 08/20/2020  [place] 08/20/2020 1739   HGBUR NEGATIVE 08/20/2020 1739   BILIRUBINUR NEGATIVE 08/20/2020 1739   KETONESUR 20 (A) 08/20/2020 1739   PROTEINUR NEGATIVE 08/20/2020 1739   NITRITE NEGATIVE 08/20/2020 1739   LEUKOCYTESUR TRACE (A) 08/20/2020 1739   Sepsis Labs Invalid input(s): PROCALCITONIN,  WBC,  LACTICIDVEN Microbiology Recent Results (from the past 240 hour(s))  Blood culture (routine x 2)     Status: None   Collection Time: 08/20/20  7:50 PM   Specimen: BLOOD  Result Value Ref Range Status   Specimen Description   Final    BLOOD LEFT ANTECUBITAL Performed at [facility], [place] [address] [place], [place] [address]    Special Requests   Final    BOTTLES DRAWN AEROBIC AND ANAEROBIC Blood Culture adequate volume Performed at [facility] [address], [place], [place] [address]    Culture   Final    NO GROWTH 5 DAYS Performed at [facility], [place] [address], [place], [place] [address]    Report Status 08/25/2020 FINAL  Final  Blood culture (routine x 2)     Status: None   Collection Time: 08/20/20  8:05 PM   Specimen: BLOOD  Result Value Ref Range Status   Specimen Description   Final    BLOOD RIGHT ANTECUBITAL Performed at [facility] [address] [place], [place] [address]    Special Requests   Final    BOTTLES DRAWN AEROBIC AND ANAEROBIC Blood Culture adequate volume Performed at [facility] [address], [place], [place] [address]    Culture   Final    NO GROWTH 5 DAYS Performed at [facility], [place] [address], [place],  [address]    Report Status 08/25/2020 FINAL  Final  Respiratory Panel by RT PCR (Flu  A&B, Covid) - Nasopharyngeal Swab     Status: None   Collection Time: 08/20/20  8:13 PM   Specimen: Nasopharyngeal Swab  Result Value Ref Range Status   SARS Coronavirus 2 by RT PCR NEGATIVE NEGATIVE Final    Comment: (NOTE) SARS-CoV-2 target nucleic acids are NOT DETECTED.  The SARS-CoV-2 RNA is generally detectable in upper respiratoy specimens during the acute phase of infection. The lowest concentration of SARS-CoV-2 viral copies this assay can detect is 131 copies/mL. A negative result does not preclude SARS-Cov-2 infection and should not be used as the sole basis for treatment or other patient management decisions. A negative result may occur with  improper specimen collection/handling, submission of specimen other than nasopharyngeal swab, presence of viral mutation(s) within the areas targeted by this assay, and inadequate number of viral copies (<131 copies/mL). A negative result must be combined with clinical observations, patient history, and epidemiological information. The expected result is Negative.  Fact Sheet for Patients:  PinkCheek.be  Fact Sheet for Healthcare Providers:  GravelBags.it  This test is no t yet approved or cleared by the [place] FDA and  has been authorized for detection and/or diagnosis of SARS-CoV-2 by FDA under an Emergency Use Authorization (EUA). This EUA will remain  in effect (meaning this test can be used) for the duration of the COVID-19 declaration under Section 564(b)(1) of the Act, 21 U.S.C. section 360bbb-3(b)(1), unless the authorization is terminated or revoked sooner.     Influenza A by PCR NEGATIVE NEGATIVE Final   Influenza B by PCR NEGATIVE NEGATIVE Final    Comment: (NOTE) The Xpert Xpress  SARS-CoV-2/FLU/RSV assay is intended as an aid in  the diagnosis of influenza from Nasopharyngeal swab specimens and  should not be used as a sole basis for treatment. Nasal  washings and  aspirates are unacceptable for Xpert Xpress SARS-CoV-2/FLU/RSV  testing.  Fact Sheet for Patients: PinkCheek.be  Fact Sheet for Healthcare Providers: GravelBags.it  This test is not yet approved or cleared by the [place] FDA and  has been authorized for detection and/or diagnosis of SARS-CoV-2 by  FDA under an Emergency Use Authorization (EUA). This EUA will remain  in effect (meaning this test can be used) for the duration of the  Covid-19 declaration under Section 564(b)(1) of the Act, 21  U.S.C. section 360bbb-3(b)(1), unless the authorization is  terminated or revoked. Performed at [facility], [place] [address], [place], [place] [address]      Time coordinating discharge:  35 minutes  SIGNED:   Barb Merino, MD  [facility] 08/25/2020, 3:09 PM

## 2020-08-25 NOTE — Progress Notes (Signed)
    Progress Note   Subjective  Chief Complaint: Elevated LFTs, abdominal pain and diarrhea, CT with enterocolitis, suspected CMV  Today, the patient tells me he continued with a fever overnight, he is feeling just about the same.  He is happy that we have an answer but not very happy that his symptoms could last over the next 3 to 4 months per ID.  He is ready to go home.    Objective   Vital signs in last 24 hours: Temp:  [98.4 F (36.9 C)-98.9 F (37.2 C)] 98.4 F (36.9 C) (11/12 0509) Pulse Rate:  [88-98] 94 (11/12 0509) Resp:  [17-18] 18 (11/12 0509) BP: (118-137)/(75-84) 137/84 (11/12 0509) SpO2:  [91 %-97 %] 94 % (11/12 0509) Last BM Date: 08/23/20 General:    white male in NAD Heart:  Regular rate and rhythm; no murmurs Lungs: Respirations even and unlabored, lungs CTA bilaterally Abdomen:  Soft, mild distention, mild generalized TTP. Normal bowel sounds. Extremities:  Without edema. Neurologic:  Alert and oriented,  grossly normal neurologically. Psych:  Cooperative. Normal mood and affect.  Intake/Output from previous day: 11/11 0701 - 11/12 0700 In: 1980 [P.O.:480; I.V.:1500] Out: -  Intake/Output this shift: Total I/O In: 120 [P.O.:120] Out: -   Lab Results: Recent Labs    08/23/20 0510 08/24/20 0452 08/25/20 0452  WBC 19.5* 20.7* 17.5*  HGB 14.1 13.2 13.5  HCT 43.5 40.5 41.1  PLT 179 154 150   BMET Recent Labs    08/23/20 0510 08/24/20 0452 08/25/20 0452  NA 130* 131* 132*  K 4.3 4.9 4.8  CL 99 101 102  CO2 26 24 24   GLUCOSE 110* 108* 106*  BUN 13 12 13   CREATININE 1.32* 1.29* 1.25*  CALCIUM 7.7* 7.6* 7.6*   LFT Recent Labs    08/25/20 0452  PROT 5.2*  ALBUMIN 2.3*  AST 101*  ALT 140*  ALKPHOS 138*  BILITOT 0.6    Assessment / Plan:   Assessment: 1.  Enterocolitis: Continued abdominal distention, ID suspect CMV versus EBV 2.  Nausea and vomiting: Improved 3.  Transaminitis: Improving  4.  Leukocytosis  Plan: 1.  ID is  following and suspect CMV versus EBV. 2.  Agree with plans for discharge today.  Discussed with the patient that he could take MiraLAX daily at discharge in order to make sure he is having daily bowel movements. 3.  Please await any final recommendations from Dr. Tarri Glenn later today.  Thank you for kind consultation, we will sign off.    LOS: 5 days   Levin Erp  08/25/2020, 10:53 AM

## 2020-08-26 LAB — CMV IGM: CMV IgM: 182 AU/mL — ABNORMAL HIGH (ref 0.0–29.9)

## 2020-08-26 LAB — CMV ANTIBODY, IGG (EIA): CMV Ab - IgG: 1.8 U/mL — ABNORMAL HIGH (ref 0.00–0.59)

## 2020-08-27 LAB — EBV AB TO VIRAL CAPSID AG PNL, IGG+IGM
EBV VCA IgG: 171 U/mL — ABNORMAL HIGH (ref 0.0–17.9)
EBV VCA IgM: 39.6 U/mL — ABNORMAL HIGH (ref 0.0–35.9)

## 2020-08-28 ENCOUNTER — Encounter: Payer: Self-pay | Admitting: Family Medicine

## 2020-08-31 ENCOUNTER — Ambulatory Visit: Payer: Medicare PPO | Admitting: Family Medicine

## 2020-08-31 ENCOUNTER — Other Ambulatory Visit: Payer: Self-pay

## 2020-08-31 VITALS — BP 120/70 | HR 96 | Temp 97.4°F | Ht 75.0 in | Wt 209.0 lb

## 2020-08-31 DIAGNOSIS — M7989 Other specified soft tissue disorders: Secondary | ICD-10-CM

## 2020-08-31 DIAGNOSIS — B251 Cytomegaloviral hepatitis: Secondary | ICD-10-CM | POA: Diagnosis not present

## 2020-08-31 MED ORDER — FUROSEMIDE 20 MG PO TABS: 20.0000 mg | ORAL_TABLET | Freq: Every day | ORAL | 3 refills | Status: DC

## 2020-08-31 MED ORDER — ALPRAZOLAM 0.5 MG PO TABS: 0.5000 mg | ORAL_TABLET | Freq: Every evening | ORAL | 0 refills | Status: DC | PRN

## 2020-08-31 NOTE — Progress Notes (Signed)
Subjective:    Patient ID: Dwayne Young, male    DOB: [birth year], 69 y.o.   MRN: [id number]  Unfortunately, patient eventually had to be hospitalized due to recurrent fever, dehydration.  In the emergency room, patient was found to have profound elevations in his liver function test 7 times the upper limits of normal.  CT scan was obtained of the head that showed no acute abnormalities.  CT scan of the abdomen and pelvis did show a mild colitis.  However presentation was more consistent with a viral syndrome.  Liver function tests trended down throughout his hospitalization and were above 100 at the time of discharge but had improved.  White blood cell count had trended from 20-17 at the time of discharge.  Lab work confirmed positive IgM and IgG for cytomegalovirus.  Patient is here today for follow-up.  He reports severe fatigue.  He reports trouble sleeping.  His weight is up from his last visit however both legs are swollen.  He has +1 pitting edema to the level of the knee bilaterally.  He has a negative Homans' sign.  He denies any pleurisy.  He denies any shortness of breath other than fatigue.  Given the symmetry, I do not believe he has a DVT.  I believe this is likely due to IV fluid.  He has not been wearing compression hose.  He has been out of the hospital for a week.  He denies any orthopnea or paroxysmal nocturnal dyspnea. Past Surgical History:  Procedure Laterality Date  . COLONOSCOPY N/A 04/27/2013   Procedure: COLONOSCOPY;  Surgeon: Jamesetta So, MD;  Location: [facility];  Service: Gastroenterology;  Laterality: N/A;  . GANGLION CYST EXCISION Right   . KNEE ARTHROSCOPY Bilateral    open procedure  . LYMPHADENECTOMY Bilateral 03/03/2014   Procedure: LYMPHADENECTOMY;  Surgeon: Raynelle Bring, MD;  Location: [facility];  Service: Urology;  Laterality: Bilateral;  . NASAL SEPTOPLASTY W/ TURBINOPLASTY    . PROSTATE SURGERY N/A    [employer]reesia 07/08/2020  . ROBOT ASSISTED LAPAROSCOPIC  RADICAL PROSTATECTOMY N/A 03/03/2014   Procedure: ROBOTIC ASSISTED LAPAROSCOPIC RADICAL PROSTATECTOMY LEVEL 2;  Surgeon: Raynelle Bring, MD;  Location: [facility];  Service: Urology;  Laterality: N/A;  . TONSILLECTOMY     child  . TOOTH EXTRACTION     preparing for tooth implant  . VASECTOMY N/A    [employer]reesia 07/08/2020   Past Medical History:  Diagnosis Date  . Arthritis    arthritis-generalized-knees, elbows  . Elevated PSA   . Hypercholesteremia   . Hyperlipidemia   . Prostate cancer ([place])    dx. prostate cancer after bx. 2'15-now surgery planned  . Retinal detachment   . Seasonal allergies    Current Outpatient Medications on File Prior to Visit  Medication Sig Dispense Refill  . acetaminophen (TYLENOL) 650 MG CR tablet Take 1 tablet (650 mg total) by mouth every 8 (eight) hours as needed for pain.    Marland Kitchen BESIVANCE 0.6 % SUSP Place 1 drop into the right eye 3 (three) times daily.    . cyclobenzaprine (FLEXERIL) 10 MG tablet Take 1 tablet (10 mg total) by mouth 3 (three) times daily as needed for muscle spasms. 30 tablet 0  . diclofenac Sodium (VOLTAREN) 1 % GEL Apply 1 application topically daily as needed (pain).    . DUREZOL 0.05 % EMUL Place 1 drop into the left eye 3 (three) times daily.    Marland Kitchen LORazepam (ATIVAN) 1 MG tablet TAKE (1)  TABLET BY MOUTH TWICE DAILY AS NEEDED FOR ANXIETY. (Patient taking differently: Take by mouth at bedtime as needed for sleep. ) 20 tablet 0  . Omega-3 Fatty Acids (FISH OIL) 500 MG CAPS Take 500 mg by mouth daily.    Marland Kitchen PROLENSA 0.07 % SOLN Place 1 drop into the left eye at bedtime.     No current facility-administered medications on file prior to visit.   No Known Allergies Social History   Socioeconomic History  . Marital status: Married    Spouse name: Not on file  . Number of children: Not on file  . Years of education: Not on file  . Highest education level: Not on file  Occupational History  . Not on file  Tobacco Use  . Smoking status:  Former Smoker    Packs/day: 0.50    Years: 15.00    Pack years: 7.50    Types: Cigarettes    Quit date: 04/27/1989    Years since quitting: 31.3  . Smokeless tobacco: Never Used  Substance and Sexual Activity  . Alcohol use: Yes    Alcohol/week: 1.0 standard drink    Types: 1 Glasses of wine per week    Comment: moderate - wine every 3 rd day  . Drug use: No  . Sexual activity: Yes    Birth control/protection: None  Other Topics Concern  . Not on file  Social History Narrative  . Not on file   Social Determinants of Health   Financial Resource Strain:   . Difficulty of Paying Living Expenses: Not on file  Food Insecurity:   . Worried About Charity fundraiser in the Last Year: Not on file  . Ran Out of Food in the Last Year: Not on file  Transportation Needs:   . Lack of Transportation (Medical): Not on file  . Lack of Transportation (Non-Medical): Not on file  Physical Activity:   . Days of Exercise per Week: Not on file  . Minutes of Exercise per Session: Not on file  Stress:   . Feeling of Stress : Not on file  Social Connections:   . Frequency of Communication with Friends and Family: Not on file  . Frequency of Social Gatherings with Friends and Family: Not on file  . Attends Religious Services: Not on file  . Active Member of Clubs or Organizations: Not on file  . Attends Archivist Meetings: Not on file  . Marital Status: Not on file  Intimate Partner Violence:   . Fear of Current or Ex-Partner: Not on file  . Emotionally Abused: Not on file  . Physically Abused: Not on file  . Sexually Abused: Not on file      Review of Systems  Neurological: Positive for headaches.  All other systems reviewed and are negative.      Objective:   Physical Exam Vitals reviewed.  Constitutional:      General: He is not in acute distress.    Appearance: Normal appearance. He is normal weight. He is not ill-appearing, toxic-appearing or diaphoretic.  HENT:      Head: Normocephalic and atraumatic.     Right Ear: Tympanic membrane, ear canal and external ear normal. There is no impacted cerumen.     Left Ear: Tympanic membrane, ear canal and external ear normal. There is no impacted cerumen.     Nose: Nose normal. No congestion or rhinorrhea.     Mouth/Throat:     Mouth: Mucous membranes are moist.  Pharynx: Oropharynx is clear. No oropharyngeal exudate or posterior oropharyngeal erythema.  Eyes:     General: No scleral icterus.       Right eye: No discharge.        Left eye: No discharge.     Extraocular Movements: Extraocular movements intact.     Conjunctiva/sclera: Conjunctivae normal.     Pupils: Pupils are equal, round, and reactive to light.  Neck:     Vascular: No carotid bruit.  Cardiovascular:     Rate and Rhythm: Normal rate and regular rhythm.     Pulses: Normal pulses.     Heart sounds: Normal heart sounds. No murmur heard.  No friction rub. No gallop.   Pulmonary:     Effort: Pulmonary effort is normal. No respiratory distress.     Breath sounds: Normal breath sounds. No stridor. No wheezing, rhonchi or rales.  Chest:     Chest wall: No tenderness.  Abdominal:     General: Bowel sounds are normal. There is no distension.     Palpations: Abdomen is soft. There is no mass.     Tenderness: There is no abdominal tenderness. There is no right CVA tenderness, left CVA tenderness, guarding or rebound.     Hernia: No hernia is present.  Musculoskeletal:     Cervical back: Normal range of motion and neck supple. No rigidity.     Right lower leg: Edema present.     Left lower leg: Edema present.  Lymphadenopathy:     Cervical: No cervical adenopathy.  Skin:    General: Skin is warm.     Coloration: Skin is not jaundiced or pale.     Findings: No bruising, erythema, lesion or rash.  Neurological:     General: No focal deficit present.     Mental Status: He is alert and oriented to person, place, and time.     Cranial  Nerves: No cranial nerve deficit.     Sensory: No sensory deficit.     Motor: No weakness.     Coordination: Coordination normal.     Gait: Gait normal.     Deep Tendon Reflexes: Reflexes normal.  Psychiatric:        Mood and Affect: Mood normal.        Behavior: Behavior normal.        Thought Content: Thought content normal.        Judgment: Judgment normal.           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  Repeat CBC and CMP to monitor trends in his white blood cell count and his liver function test.  We will treat the third spaced peripheral edema with compression hose and Lasix 20 mg daily.  Discontinue once the swelling has improved.  If he develops any chest pain or shortness of breath we will evaluate for DVT/PE however I believe this is simply due to aggressive IV fluids in the hospital and the patient agrees.  Check BNP to rule out some type of viral myocarditis.  I did refill the patient's alprazolam that he uses sparingly to help him sleep.  Recheck if no better in 1 week or sooner if worse.  Add an Ensure supplement given protein calorie malnutrition seen on his blood work.

## 2020-09-01 ENCOUNTER — Other Ambulatory Visit: Payer: Self-pay

## 2020-09-01 DIAGNOSIS — D72829 Elevated white blood cell count, unspecified: Secondary | ICD-10-CM

## 2020-09-01 LAB — CBC WITH DIFFERENTIAL/PLATELET
Absolute Monocytes: 2397 cells/uL — ABNORMAL HIGH (ref 200–950)
Basophils Absolute: 376 cells/uL — ABNORMAL HIGH (ref 0–200)
Basophils Relative: 1.6 %
Eosinophils Absolute: 259 cells/uL (ref 15–500)
Eosinophils Relative: 1.1 %
HCT: 44.1 % (ref 38.5–50.0)
Hemoglobin: 14.6 g/dL (ref 13.2–17.1)
Lymphs Abs: 16474 cells/uL — ABNORMAL HIGH (ref 850–3900)
MCH: 30.4 pg (ref 27.0–33.0)
MCHC: 33.1 g/dL (ref 32.0–36.0)
MCV: 91.9 fL (ref 80.0–100.0)
MPV: 9.9 fL (ref 7.5–12.5)
Monocytes Relative: 10.2 %
Neutro Abs: 3995 cells/uL (ref 1500–7800)
Neutrophils Relative %: 17 %
Platelets: 211 10*3/uL (ref 140–400)
RBC: 4.8 10*6/uL (ref 4.20–5.80)
RDW: 14 % (ref 11.0–15.0)
Total Lymphocyte: 70.1 %
WBC: 23.5 10*3/uL — ABNORMAL HIGH (ref 3.8–10.8)

## 2020-09-01 LAB — COMPLETE METABOLIC PANEL WITH GFR
AG Ratio: 1.2 (calc) (ref 1.0–2.5)
ALT: 63 U/L — ABNORMAL HIGH (ref 9–46)
AST: 75 U/L — ABNORMAL HIGH (ref 10–35)
Albumin: 3.1 g/dL — ABNORMAL LOW (ref 3.6–5.1)
Alkaline phosphatase (APISO): 139 U/L (ref 35–144)
BUN/Creatinine Ratio: 11 (calc) (ref 6–22)
BUN: 14 mg/dL (ref 7–25)
CO2: 26 mmol/L (ref 20–32)
Calcium: 8.4 mg/dL — ABNORMAL LOW (ref 8.6–10.3)
Chloride: 104 mmol/L (ref 98–110)
Creat: 1.22 mg/dL — ABNORMAL HIGH (ref 0.70–1.18)
GFR, Est African American: 69 mL/min/{1.73_m2} (ref >=60)
GFR, Est Non African American: 60 mL/min/{1.73_m2} (ref >=60)
Globulin: 2.6 g/dL (calc) (ref 1.9–3.7)
Glucose, Bld: 90 mg/dL (ref 65–99)
Potassium: 4.5 mmol/L (ref 3.5–5.3)
Sodium: 138 mmol/L (ref 135–146)
Total Bilirubin: 0.5 mg/dL (ref 0.2–1.2)
Total Protein: 5.7 g/dL — ABNORMAL LOW (ref 6.1–8.1)

## 2020-09-01 LAB — BRAIN NATRIURETIC PEPTIDE: Brain Natriuretic Peptide: 30 pg/mL (ref <100)

## 2020-09-11 ENCOUNTER — Other Ambulatory Visit: Payer: Medicare PPO

## 2020-09-11 ENCOUNTER — Other Ambulatory Visit: Payer: Self-pay

## 2020-09-11 DIAGNOSIS — R945 Abnormal results of liver function studies: Secondary | ICD-10-CM

## 2020-09-11 DIAGNOSIS — D72829 Elevated white blood cell count, unspecified: Secondary | ICD-10-CM | POA: Diagnosis not present

## 2020-09-11 LAB — CBC WITH DIFFERENTIAL/PLATELET
Absolute Monocytes: 765 cells/uL (ref 200–950)
Basophils Absolute: 82 cells/uL (ref 0–200)
Basophils Relative: 0.8 %
Eosinophils Absolute: 112 cells/uL (ref 15–500)
Eosinophils Relative: 1.1 %
HCT: 42.9 % (ref 38.5–50.0)
Hemoglobin: 14.1 g/dL (ref 13.2–17.1)
Lymphs Abs: 6589 cells/uL — ABNORMAL HIGH (ref 850–3900)
MCH: 30.3 pg (ref 27.0–33.0)
MCHC: 32.9 g/dL (ref 32.0–36.0)
MCV: 92.3 fL (ref 80.0–100.0)
MPV: 9.9 fL (ref 7.5–12.5)
Monocytes Relative: 7.5 %
Neutro Abs: 2652 cells/uL (ref 1500–7800)
Neutrophils Relative %: 26 %
Platelets: 213 10*3/uL (ref 140–400)
RBC: 4.65 10*6/uL (ref 4.20–5.80)
RDW: 14.4 % (ref 11.0–15.0)
Total Lymphocyte: 64.6 %
WBC: 10.2 10*3/uL (ref 3.8–10.8)

## 2020-09-11 LAB — COMPLETE METABOLIC PANEL WITH GFR
AG Ratio: 1.2 (calc) (ref 1.0–2.5)
ALT: 49 U/L — ABNORMAL HIGH (ref 9–46)
AST: 48 U/L — ABNORMAL HIGH (ref 10–35)
Albumin: 3.5 g/dL — ABNORMAL LOW (ref 3.6–5.1)
Alkaline phosphatase (APISO): 101 U/L (ref 35–144)
BUN: 17 mg/dL (ref 7–25)
CO2: 28 mmol/L (ref 20–32)
Calcium: 8.6 mg/dL (ref 8.6–10.3)
Chloride: 106 mmol/L (ref 98–110)
Creat: 1.07 mg/dL (ref 0.70–1.18)
GFR, Est African American: 81 mL/min/{1.73_m2} (ref >=60)
GFR, Est Non African American: 70 mL/min/{1.73_m2} (ref >=60)
Globulin: 2.9 g/dL (calc) (ref 1.9–3.7)
Glucose, Bld: 100 mg/dL — ABNORMAL HIGH (ref 65–99)
Potassium: 4.5 mmol/L (ref 3.5–5.3)
Sodium: 140 mmol/L (ref 135–146)
Total Bilirubin: 0.6 mg/dL (ref 0.2–1.2)
Total Protein: 6.4 g/dL (ref 6.1–8.1)

## 2020-09-15 DIAGNOSIS — H2512 Age-related nuclear cataract, left eye: Secondary | ICD-10-CM | POA: Diagnosis not present

## 2020-11-07 ENCOUNTER — Other Ambulatory Visit: Payer: Self-pay | Admitting: Family Medicine

## 2020-11-07 ENCOUNTER — Encounter: Payer: Self-pay | Admitting: Family Medicine

## 2020-11-07 DIAGNOSIS — R918 Other nonspecific abnormal finding of lung field: Secondary | ICD-10-CM

## 2020-11-09 ENCOUNTER — Ambulatory Visit
Admission: RE | Admit: 2020-11-09 | Discharge: 2020-11-09 | Disposition: A | Discharge status: Home / Self Care | Payer: Medicare PPO | Source: Ambulatory Visit | Attending: Family Medicine | Admitting: Family Medicine

## 2020-11-09 DIAGNOSIS — R918 Other nonspecific abnormal finding of lung field: Secondary | ICD-10-CM | POA: Diagnosis not present

## 2021-01-02 DIAGNOSIS — Z961 Presence of intraocular lens: Secondary | ICD-10-CM | POA: Diagnosis not present

## 2021-01-02 DIAGNOSIS — H35373 Puckering of macula, bilateral: Secondary | ICD-10-CM | POA: Diagnosis not present

## 2021-01-02 DIAGNOSIS — H18413 Arcus senilis, bilateral: Secondary | ICD-10-CM | POA: Diagnosis not present

## 2021-01-02 DIAGNOSIS — H26491 Other secondary cataract, right eye: Secondary | ICD-10-CM | POA: Diagnosis not present

## 2021-01-02 DIAGNOSIS — H26493 Other secondary cataract, bilateral: Secondary | ICD-10-CM | POA: Diagnosis not present

## 2021-01-09 DIAGNOSIS — H26492 Other secondary cataract, left eye: Secondary | ICD-10-CM | POA: Diagnosis not present

## 2021-02-02 DIAGNOSIS — Z23 Encounter for immunization: Secondary | ICD-10-CM | POA: Diagnosis not present

## 2021-02-26 ENCOUNTER — Other Ambulatory Visit: Payer: Self-pay

## 2021-02-26 ENCOUNTER — Other Ambulatory Visit: Payer: Medicare PPO

## 2021-02-26 DIAGNOSIS — D225 Melanocytic nevi of trunk: Secondary | ICD-10-CM | POA: Diagnosis not present

## 2021-02-26 DIAGNOSIS — L820 Inflamed seborrheic keratosis: Secondary | ICD-10-CM | POA: Diagnosis not present

## 2021-02-26 DIAGNOSIS — D3611 Benign neoplasm of peripheral nerves and autonomic nervous system of face, head, and neck: Secondary | ICD-10-CM | POA: Diagnosis not present

## 2021-02-26 DIAGNOSIS — D485 Neoplasm of uncertain behavior of skin: Secondary | ICD-10-CM | POA: Diagnosis not present

## 2021-02-26 DIAGNOSIS — E785 Hyperlipidemia, unspecified: Secondary | ICD-10-CM | POA: Diagnosis not present

## 2021-02-26 DIAGNOSIS — L821 Other seborrheic keratosis: Secondary | ICD-10-CM | POA: Diagnosis not present

## 2021-02-26 DIAGNOSIS — R972 Elevated prostate specific antigen [PSA]: Secondary | ICD-10-CM

## 2021-02-26 DIAGNOSIS — D2339 Other benign neoplasm of skin of other parts of face: Secondary | ICD-10-CM | POA: Diagnosis not present

## 2021-02-27 LAB — COMPLETE METABOLIC PANEL WITH GFR
AG Ratio: 1.7 (calc) (ref 1.0–2.5)
ALT: 29 U/L (ref 9–46)
AST: 24 U/L (ref 10–35)
Albumin: 4 g/dL (ref 3.6–5.1)
Alkaline phosphatase (APISO): 61 U/L (ref 35–144)
BUN: 18 mg/dL (ref 7–25)
CO2: 27 mmol/L (ref 20–32)
Calcium: 8.9 mg/dL (ref 8.6–10.3)
Chloride: 105 mmol/L (ref 98–110)
Creat: 1.1 mg/dL (ref 0.70–1.18)
GFR, Est African American: 78 mL/min/{1.73_m2} (ref >=60)
GFR, Est Non African American: 68 mL/min/{1.73_m2} (ref >=60)
Globulin: 2.4 g/dL (calc) (ref 1.9–3.7)
Glucose, Bld: 93 mg/dL (ref 65–99)
Potassium: 4.3 mmol/L (ref 3.5–5.3)
Sodium: 138 mmol/L (ref 135–146)
Total Bilirubin: 0.7 mg/dL (ref 0.2–1.2)
Total Protein: 6.4 g/dL (ref 6.1–8.1)

## 2021-02-27 LAB — CBC WITH DIFFERENTIAL/PLATELET
Absolute Monocytes: 560 cells/uL (ref 200–950)
Basophils Absolute: 32 cells/uL (ref 0–200)
Basophils Relative: 0.4 %
Eosinophils Absolute: 112 cells/uL (ref 15–500)
Eosinophils Relative: 1.4 %
HCT: 44 % (ref 38.5–50.0)
Hemoglobin: 14.7 g/dL (ref 13.2–17.1)
Lymphs Abs: 4512 cells/uL — ABNORMAL HIGH (ref 850–3900)
MCH: 31.7 pg (ref 27.0–33.0)
MCHC: 33.4 g/dL (ref 32.0–36.0)
MCV: 94.8 fL (ref 80.0–100.0)
MPV: 10.2 fL (ref 7.5–12.5)
Monocytes Relative: 7 %
Neutro Abs: 2784 cells/uL (ref 1500–7800)
Neutrophils Relative %: 34.8 %
Platelets: 198 10*3/uL (ref 140–400)
RBC: 4.64 10*6/uL (ref 4.20–5.80)
RDW: 13.2 % (ref 11.0–15.0)
Total Lymphocyte: 56.4 %
WBC: 8 10*3/uL (ref 3.8–10.8)

## 2021-02-27 LAB — LIPID PANEL
Cholesterol: 153 mg/dL (ref <200)
HDL: 48 mg/dL (ref >=40)
LDL Cholesterol (Calc): 86 mg/dL (calc)
Non-HDL Cholesterol (Calc): 105 mg/dL (calc) (ref <130)
Total CHOL/HDL Ratio: 3.2 (calc) (ref <5.0)
Triglycerides: 101 mg/dL (ref <150)

## 2021-02-27 LAB — PSA: PSA: <0.04 ng/mL (ref <=4.00)

## 2021-03-02 ENCOUNTER — Encounter: Payer: Self-pay | Admitting: Family Medicine

## 2021-03-02 ENCOUNTER — Ambulatory Visit (INDEPENDENT_AMBULATORY_CARE_PROVIDER_SITE_OTHER): Payer: Medicare PPO | Admitting: Family Medicine

## 2021-03-02 ENCOUNTER — Other Ambulatory Visit: Payer: Self-pay

## 2021-03-02 VITALS — BP 130/74 | HR 70 | Temp 98.6°F | Resp 14 | Ht 75.0 in | Wt 204.0 lb

## 2021-03-02 DIAGNOSIS — M7521 Bicipital tendinitis, right shoulder: Secondary | ICD-10-CM

## 2021-03-02 MED ORDER — LORAZEPAM 1 MG PO TABS
ORAL_TABLET | ORAL | 0 refills | Status: DC
Start: 2021-03-02 — End: 2021-09-18

## 2021-03-02 NOTE — Progress Notes (Signed)
Subjective:    Patient ID: Dwayne Young, male    DOB: Jul 29, 1951, 70 y.o.   MRN: [id number]  In February, patient was pulling on a starter cord trying to start a lawnmower.  He injured his right shoulder after pulling very hard.  He has pain near the insertions of the biceps tendon near the head of the humerus.  He has tenderness in the biceps groove.  He denies any pain with abduction greater than 90 degrees.  He has a negative Hawking sign.  He has a negative empty can sign.  He has negative O'Brien sign.  He does have some tenderness with Yergason's and speeds testing.  Therefore I believe that he has biceps tendinitis.  We also reviewed his lab work which is listed below which was outstanding Lab on 02/26/2021  Component Date Value Ref Range Status  . Cholesterol 02/26/2021 153  <200 mg/dL Final  . HDL 02/26/2021 48  > OR = 40 mg/dL Final  . Triglycerides 02/26/2021 101  <150 mg/dL Final  . LDL Cholesterol (Calc) 02/26/2021 86  mg/dL (calc) Final   Comment: Reference range: <100 . Desirable range <100 mg/dL for primary prevention;   <70 mg/dL for patients with CHD or diabetic patients  with > or = 2 CHD risk factors. Marland Kitchen LDL-C is now calculated using the Martin-Hopkins  calculation, which is a validated novel method providing  better accuracy than the Friedewald equation in the  estimation of LDL-C.  Cresenciano Genre et al. Annamaria Helling. [id number]): 2061-2068  (http://education.[facility].com/faq/FAQ164)   . Total CHOL/HDL Ratio 02/26/2021 3.2  <5.0 (calc) Final  . Non-HDL Cholesterol (Calc) 02/26/2021 105  <130 mg/dL (calc) Final   Comment: For patients with diabetes plus 1 major ASCVD risk  factor, treating to a non-HDL-C goal of <100 mg/dL  (LDL-C of <70 mg/dL) is considered a therapeutic  option.   . Glucose, Bld 02/26/2021 93  65 - 99 mg/dL Final   Comment: .            Fasting reference interval .   . BUN 02/26/2021 18  7 - 25 mg/dL Final  . Creat 02/26/2021 1.10  0.70 -  1.18 mg/dL Final   Comment: For patients >4 years of age, the reference limit for Creatinine is approximately 13% higher for people identified as African-American. .   . GFR, Est Non African American 02/26/2021 68  > OR = 60 mL/min/1.110m2 Final  . GFR, Est African American 02/26/2021 78  > OR = 60 mL/min/1.62m2 Final  . BUN/Creatinine Ratio 123XX123 NOT APPLICABLE  6 - 22 (calc) Final  . Sodium 02/26/2021 138  135 - 146 mmol/L Final  . Potassium 02/26/2021 4.3  3.5 - 5.3 mmol/L Final  . Chloride 02/26/2021 105  98 - 110 mmol/L Final  . CO2 02/26/2021 27  20 - 32 mmol/L Final  . Calcium 02/26/2021 8.9  8.6 - 10.3 mg/dL Final  . Total Protein 02/26/2021 6.4  6.1 - 8.1 g/dL Final  . Albumin 02/26/2021 4.0  3.6 - 5.1 g/dL Final  . Globulin 02/26/2021 2.4  1.9 - 3.7 g/dL (calc) Final  . AG Ratio 02/26/2021 1.7  1.0 - 2.5 (calc) Final  . Total Bilirubin 02/26/2021 0.7  0.2 - 1.2 mg/dL Final  . Alkaline phosphatase (APISO) 02/26/2021 61  35 - 144 U/L Final  . AST 02/26/2021 24  10 - 35 U/L Final  . ALT 02/26/2021 29  9 - 46 U/L Final  . PSA 02/26/2021 <0.04  <  OR = 4.00 ng/mL Final   Comment: The total PSA value from this assay system is  standardized against the WHO standard. The test  result will be approximately 20% lower when compared  to the equimolar-standardized total PSA (Beckman  Coulter). Comparison of serial PSA results should be  interpreted with this fact in mind. . This test was performed using the Siemens  chemiluminescent method. Values obtained from  different assay methods cannot be used interchangeably. PSA levels, regardless of value, should not be interpreted as absolute evidence of the presence or absence of disease.   . WBC 02/26/2021 8.0  3.8 - 10.8 Thousand/uL Final  . RBC 02/26/2021 4.64  4.20 - 5.80 Million/uL Final  . Hemoglobin 02/26/2021 14.7  13.2 - 17.1 g/dL Final  . HCT 02/26/2021 44.0  38.5 - 50.0 % Final  . MCV 02/26/2021 94.8  80.0 - 100.0 fL  Final  . MCH 02/26/2021 31.7  27.0 - 33.0 pg Final  . MCHC 02/26/2021 33.4  32.0 - 36.0 g/dL Final  . RDW 02/26/2021 13.2  11.0 - 15.0 % Final  . Platelets 02/26/2021 198  140 - 400 Thousand/uL Final  . MPV 02/26/2021 10.2  7.5 - 12.5 fL Final  . Neutro Abs 02/26/2021 2,784  1,500 - 7,800 cells/uL Final  . Lymphs Abs 02/26/2021 4,512* 850 - 3,900 cells/uL Final  . Absolute Monocytes 02/26/2021 560  200 - 950 cells/uL Final  . Eosinophils Absolute 02/26/2021 112  15 - 500 cells/uL Final  . Basophils Absolute 02/26/2021 32  0 - 200 cells/uL Final  . Neutrophils Relative % 02/26/2021 34.8  % Final  . Total Lymphocyte 02/26/2021 56.4  % Final  . Monocytes Relative 02/26/2021 7.0  % Final  . Eosinophils Relative 02/26/2021 1.4  % Final  . Basophils Relative 02/26/2021 0.4  % Final    Past Surgical History:  Procedure Laterality Date  . COLONOSCOPY N/A 04/27/2013   Procedure: COLONOSCOPY;  Surgeon: Jamesetta So, MD;  [facility];  Service: Gastroenterology;  Laterality: N/A;  . GANGLION CYST EXCISION Right   . KNEE ARTHROSCOPY Bilateral    open procedure  . LYMPHADENECTOMY Bilateral 03/03/2014   Procedure: LYMPHADENECTOMY;  Surgeon: Raynelle Bring, MD;  Location: [facility];  Service: Urology;  Laterality: Bilateral;  . NASAL SEPTOPLASTY W/ TURBINOPLASTY    . PROSTATE SURGERY N/A    [facility]reesia 07/08/2020  . ROBOT ASSISTED LAPAROSCOPIC RADICAL PROSTATECTOMY N/A 03/03/2014   Procedure: ROBOTIC ASSISTED LAPAROSCOPIC RADICAL PROSTATECTOMY LEVEL 2;  Surgeon: Raynelle Bring, MD;  Location: [facility];  Service: Urology;  Laterality: N/A;  . TONSILLECTOMY     child  . TOOTH EXTRACTION     preparing for tooth implant  . VASECTOMY N/A    [facility]reesia 07/08/2020   Past Medical History:  Diagnosis Date  . Arthritis    arthritis-generalized-knees, elbows  . Elevated PSA   . Hypercholesteremia   . Hyperlipidemia   . Prostate cancer ([place])    dx. prostate cancer after bx. 2'15-now surgery  planned  . Retinal detachment   . Seasonal allergies    Current Outpatient Medications on File Prior to Visit  Medication Sig Dispense Refill  . acetaminophen (TYLENOL) 650 MG CR tablet Take 1 tablet (650 mg total) by mouth every 8 (eight) hours as needed for pain.    Marland Kitchen ALPRAZolam (XANAX) 0.5 MG tablet Take 1 tablet (0.5 mg total) by mouth at bedtime as needed for sleep. 30 tablet 0  . BESIVANCE 0.6 % SUSP  Place 1 drop into the right eye 3 (three) times daily.    . cyclobenzaprine (FLEXERIL) 10 MG tablet Take 1 tablet (10 mg total) by mouth 3 (three) times daily as needed for muscle spasms. 30 tablet 0  . diclofenac Sodium (VOLTAREN) 1 % GEL Apply 1 application topically daily as needed (pain).    . DUREZOL 0.05 % EMUL Place 1 drop into the left eye 3 (three) times daily.    . furosemide (LASIX) 20 MG tablet Take 1 tablet (20 mg total) by mouth daily. 30 tablet 3  . LORazepam (ATIVAN) 1 MG tablet TAKE (1) TABLET BY MOUTH TWICE DAILY AS NEEDED FOR ANXIETY. (Patient taking differently: Take by mouth at bedtime as needed for sleep. ) 20 tablet 0  . Omega-3 Fatty Acids (FISH OIL) 500 MG CAPS Take 500 mg by mouth daily.    Marland Kitchen PROLENSA 0.07 % SOLN Place 1 drop into the left eye at bedtime.     No current facility-administered medications on file prior to visit.   No Known Allergies Social History   Socioeconomic History  . Marital status: Married    Spouse name: Not on file  . Number of children: Not on file  . Years of education: Not on file  . Highest education level: Not on file  Occupational History  . Not on file  Tobacco Use  . Smoking status: Former Smoker    Packs/day: 0.50    Years: 15.00    Pack years: 7.50    Types: Cigarettes    Quit date: 04/27/1989    Years since quitting: 31.8  . Smokeless tobacco: Never Used  Substance and Sexual Activity  . Alcohol use: Yes    Alcohol/week: 1.0 standard drink    Types: 1 Glasses of wine per week    Comment: moderate - wine every 3  rd day  . Drug use: No  . Sexual activity: Yes    Birth control/protection: None  Other Topics Concern  . Not on file  Social History Narrative  . Not on file   Social Determinants of Health   Financial Resource Strain: Not on file  Food Insecurity: Not on file  Transportation Needs: Not on file  Physical Activity: Not on file  Stress: Not on file  Social Connections: Not on file  Intimate Partner Violence: Not on file      Review of Systems  Neurological: Positive for headaches.  All other systems reviewed and are negative.      Objective:   Physical Exam Vitals reviewed.  Constitutional:      General: He is not in acute distress.    Appearance: Normal appearance. He is normal weight. He is not ill-appearing, toxic-appearing or diaphoretic.  HENT:     Head: Normocephalic and atraumatic.  Cardiovascular:     Rate and Rhythm: Normal rate and regular rhythm.     Pulses: Normal pulses.     Heart sounds: Normal heart sounds. No murmur heard. No friction rub. No gallop.   Pulmonary:     Effort: Pulmonary effort is normal. No respiratory distress.     Breath sounds: Normal breath sounds. No stridor. No wheezing, rhonchi or rales.  Chest:     Chest wall: No tenderness.  Musculoskeletal:     Right lower leg: No edema.     Left lower leg: No edema.  Skin:    General: Skin is warm.     Coloration: Skin is not jaundiced or pale.  Findings: No bruising, erythema, lesion or rash.  Neurological:     General: No focal deficit present.     Mental Status: He is alert and oriented to person, place, and time.     Deep Tendon Reflexes: Reflexes normal.           Assessment & Plan:  Biceps tendinitis of right upper extremity  I believe the patient has biceps tendinitis.  We discussed a cortisone injection in the right shoulder for biceps tendinitis but he prefers to allow tincture of time.  We also discussed physical therapy but he would also like to give it some  additional time to wait and see if it will improve.  His lab work is outstanding.

## 2021-06-11 ENCOUNTER — Other Ambulatory Visit: Payer: Self-pay | Admitting: Family Medicine

## 2021-07-27 DIAGNOSIS — Z23 Encounter for immunization: Secondary | ICD-10-CM | POA: Diagnosis not present

## 2021-09-11 IMAGING — US US ABDOMEN LIMITED
1 series · 14 of 25 positions shown · non-contrast
Comparison: None available.

CLINICAL DATA: Initial evaluation for elevated LFTs. History of
prostate cancer.

EXAM:
ULTRASOUND ABDOMEN LIMITED RIGHT UPPER QUADRANT

[Series 1: us abdomen limited · 14 of 49 slices shown]
[im 1/49]
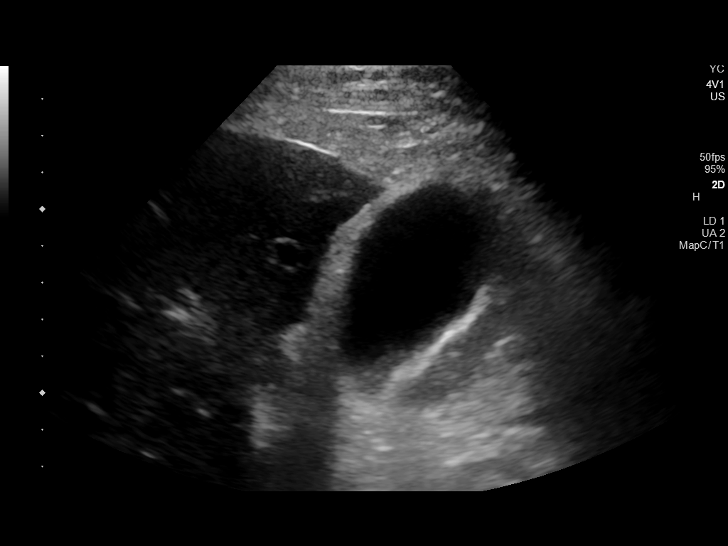
[im 5/49]
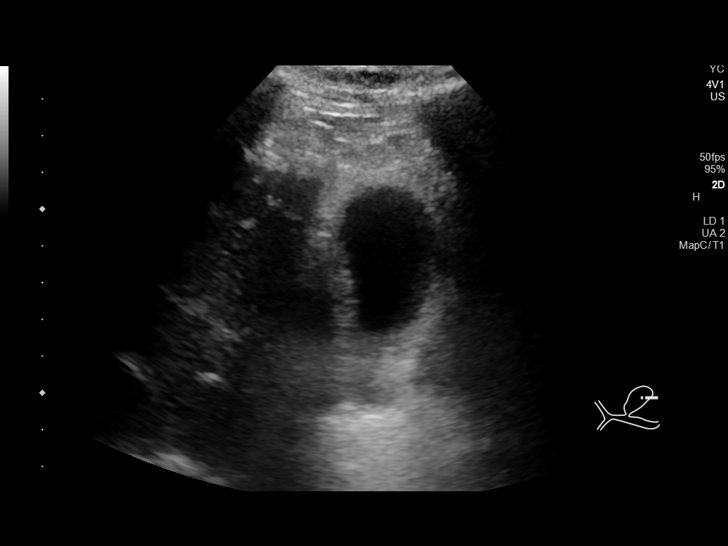
[im 9/49]
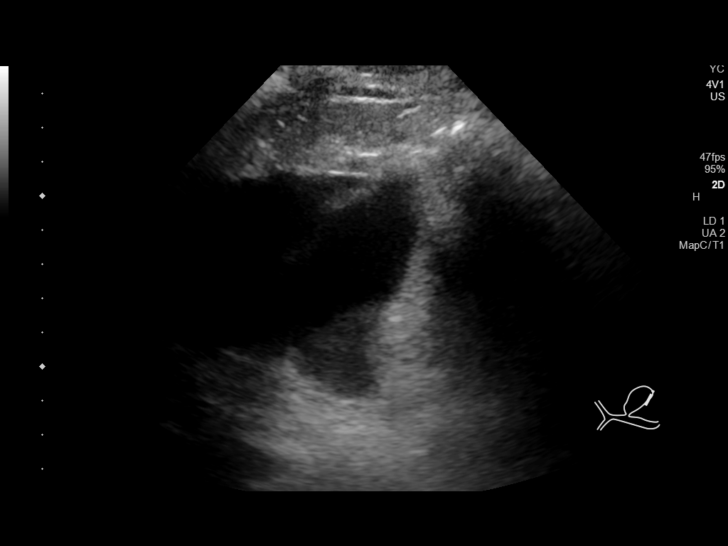
[im 13/49]
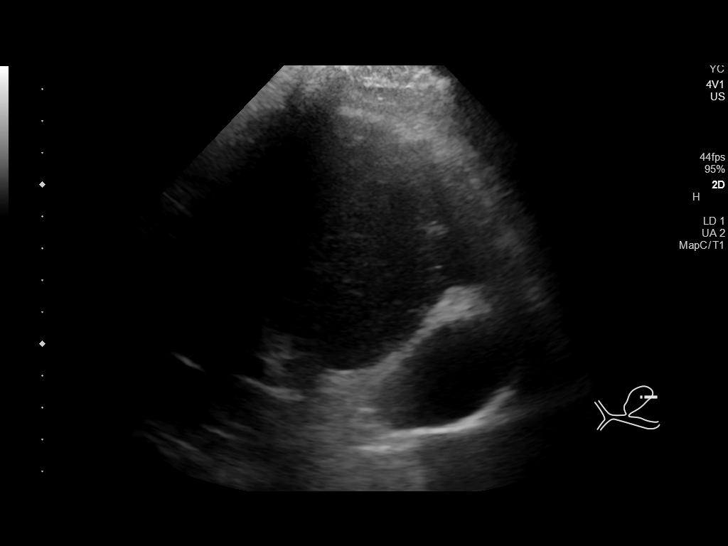
[im 17/49]
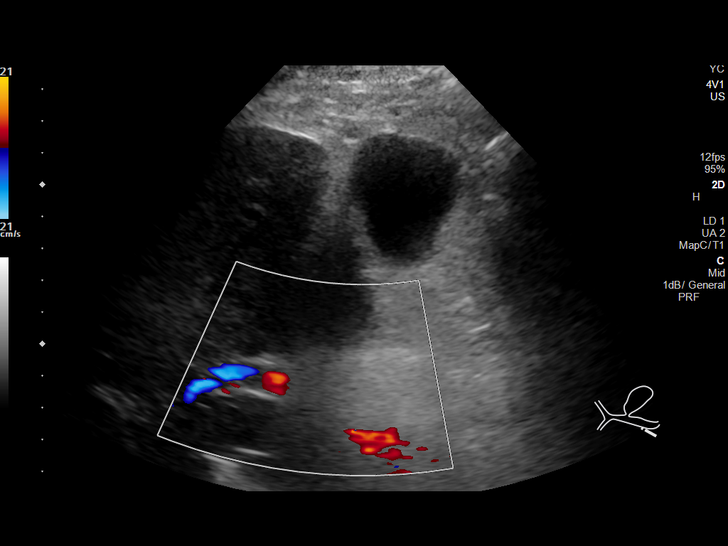
[im 19/49]
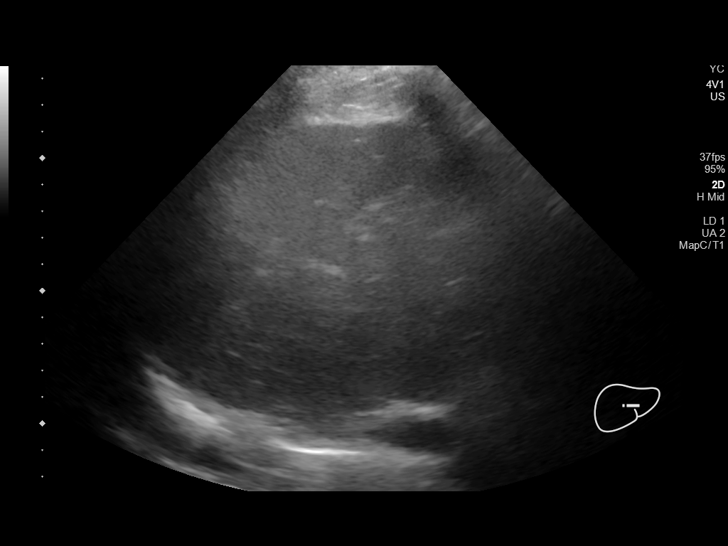
[im 23/49]
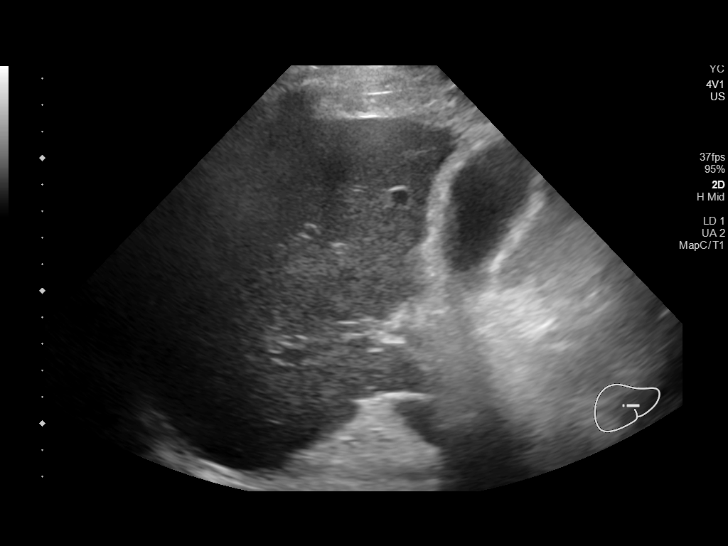
[im 27/49]
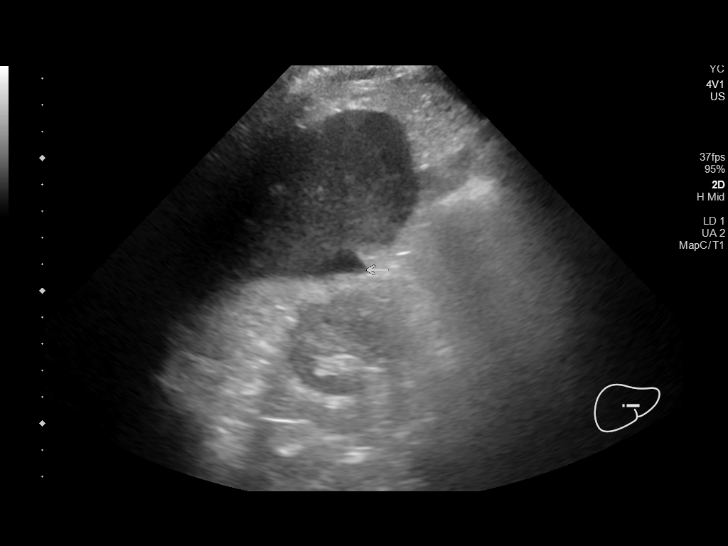
[im 31/49]
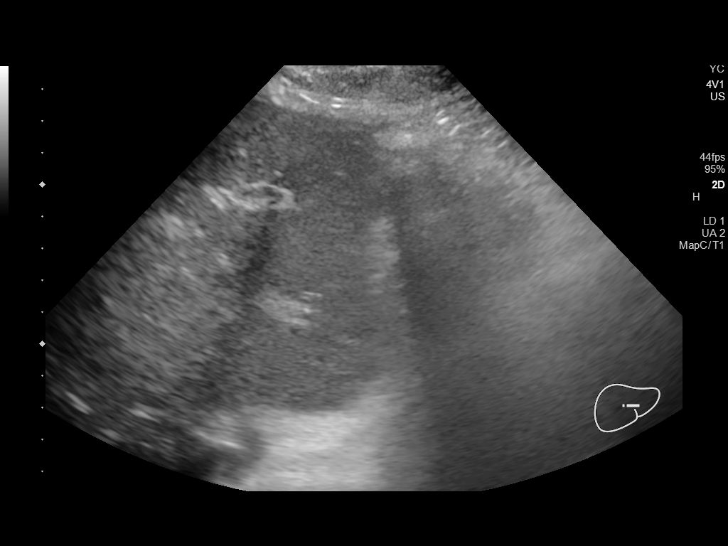
[im 33/49]
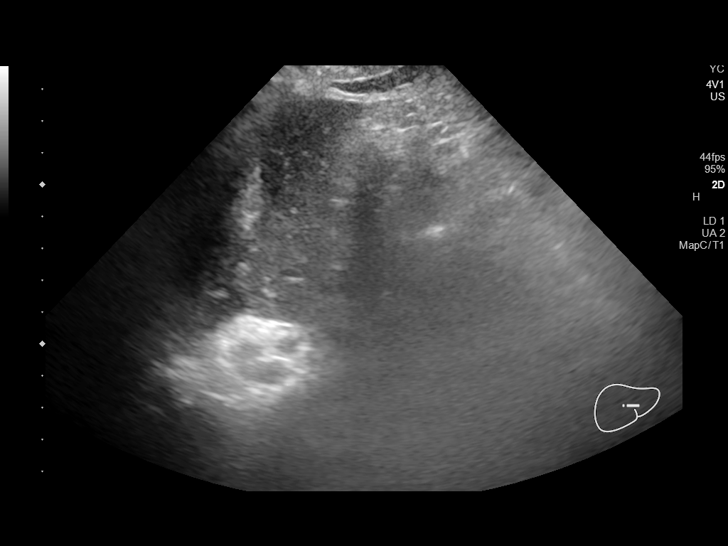
[im 37/49]
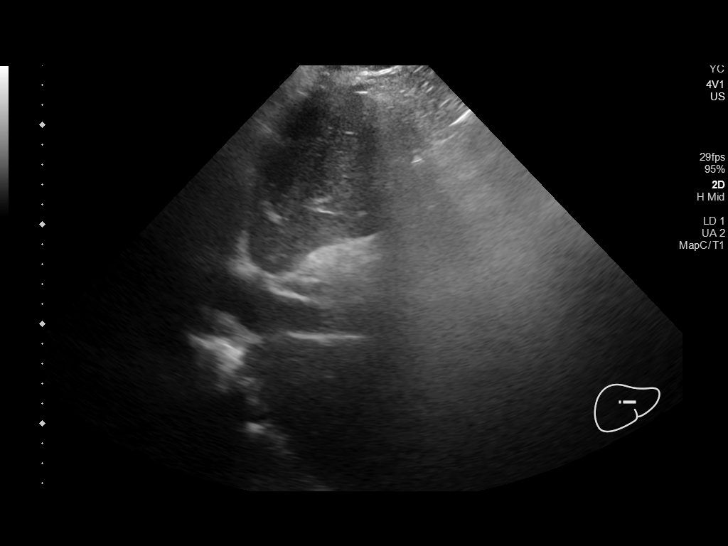
[im 41/49]
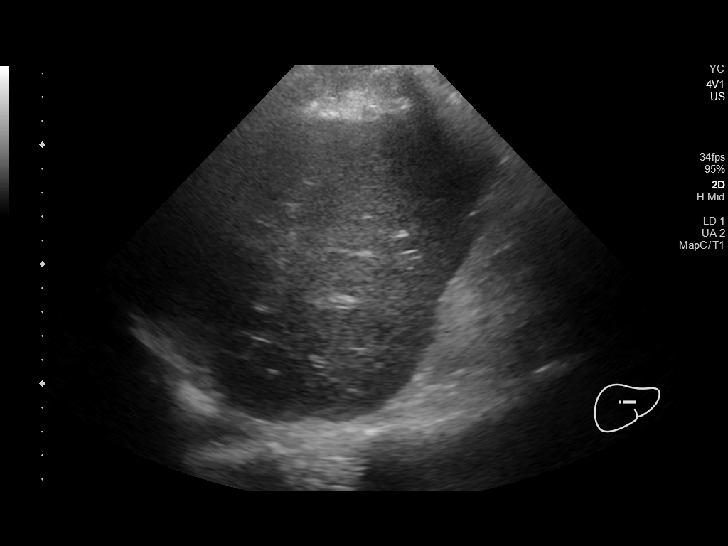
[im 45/49]
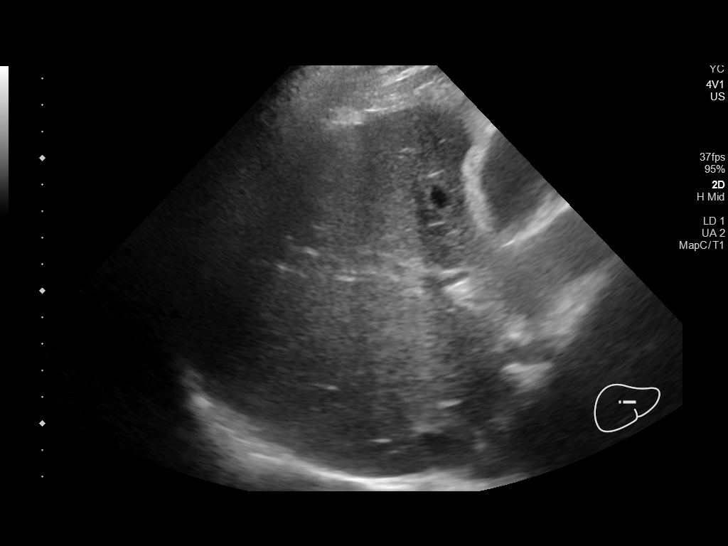
[im 49/49]
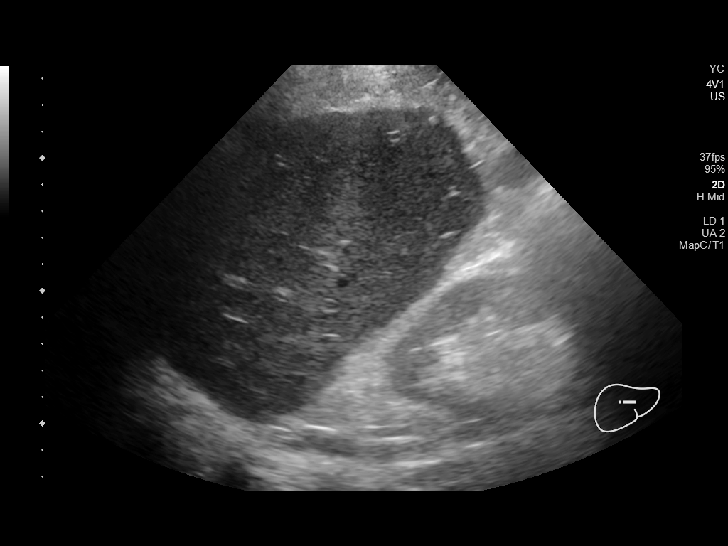

[14 of 25 positions shown; findings below may reference images not displayed]

FINDINGS: Gallbladder:

Layering echogenic sludge present within the gallbladder lumen. No
frank shadowing stones. Gallbladder wall measures at the upper
limits of normal at 3 mm. No free pericholecystic fluid. No
sonographic Murphy sign elicited on exam.

Common bile duct:

Diameter: 2.4 mm

Liver:

Liver demonstrates a somewhat coarsened echotexture with nodular
contour. No focal intrahepatic lesions. Portal vein is patent on
color Doppler imaging with normal direction of blood flow towards
the liver.

Other: Incidental note made of a right pleural effusion.
IMPRESSION: 1. Gallbladder sludge without cholelithiasis or other sonographic
features to suggest acute cholecystitis.
2. No biliary dilatation.
3. Coarse echotexture of the liver with nodular contour, suggesting
possible cirrhosis. No focal intrahepatic lesions.
4. Right pleural effusion.

## 2021-09-11 IMAGING — DX DG CHEST 1V PORT
1 series · 1 of 1 positions shown · non-contrast
Comparison: April 08, 2016

CLINICAL DATA: Cough

EXAM:
PORTABLE CHEST 1 VIEW

[chest ap]
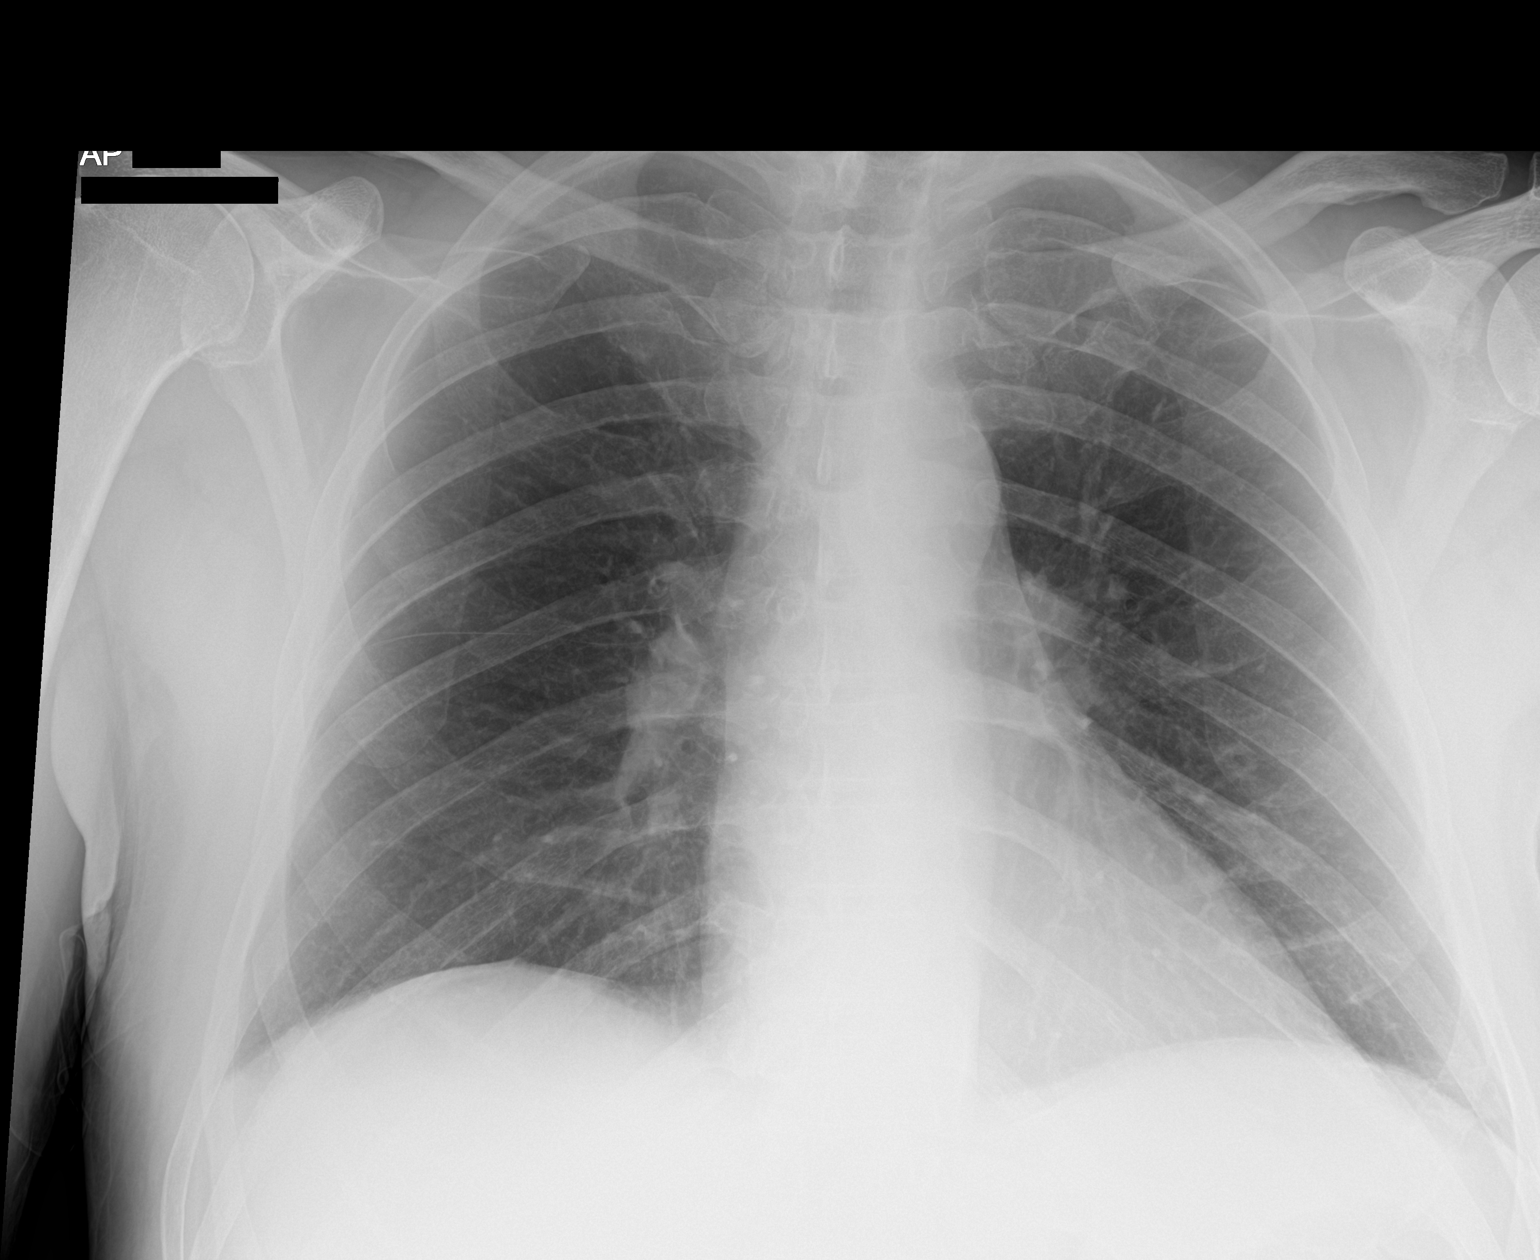

[1 of 1 positions shown; findings below may reference images not displayed]

FINDINGS: There is no definite acute cardiopulmonary process. The heart size
is unremarkable. There is no pneumothorax or pleural effusion. There
is a rounded 1 cm density in the left mid lung zone that appears to
be new since 3051.
IMPRESSION: 1. No definite acute cardiopulmonary process.
2. Rounded density in the left mid lung zone. A 4-6 week follow-up
two-view chest x-ray is recommended as an outpatient to confirm
stability or resolution of this finding.

## 2021-09-12 ENCOUNTER — Other Ambulatory Visit: Payer: Self-pay

## 2021-09-12 DIAGNOSIS — Z0000 Encounter for general adult medical examination without abnormal findings: Secondary | ICD-10-CM

## 2021-09-12 DIAGNOSIS — N1831 Chronic kidney disease, stage 3a: Secondary | ICD-10-CM

## 2021-09-12 DIAGNOSIS — E7800 Pure hypercholesterolemia, unspecified: Secondary | ICD-10-CM

## 2021-09-12 DIAGNOSIS — Z8546 Personal history of malignant neoplasm of prostate: Secondary | ICD-10-CM

## 2021-09-13 ENCOUNTER — Other Ambulatory Visit: Payer: Self-pay

## 2021-09-13 ENCOUNTER — Other Ambulatory Visit: Payer: Medicare PPO

## 2021-09-13 DIAGNOSIS — Z0000 Encounter for general adult medical examination without abnormal findings: Secondary | ICD-10-CM | POA: Diagnosis not present

## 2021-09-13 DIAGNOSIS — E7800 Pure hypercholesterolemia, unspecified: Secondary | ICD-10-CM

## 2021-09-13 DIAGNOSIS — Z8546 Personal history of malignant neoplasm of prostate: Secondary | ICD-10-CM | POA: Diagnosis not present

## 2021-09-13 DIAGNOSIS — N1831 Chronic kidney disease, stage 3a: Secondary | ICD-10-CM

## 2021-09-13 LAB — LIPID PANEL
Cholesterol: 139 mg/dL (ref <200)
HDL: 43 mg/dL (ref >=40)
LDL Cholesterol (Calc): 78 mg/dL (calc)
Non-HDL Cholesterol (Calc): 96 mg/dL (calc) (ref <130)
Total CHOL/HDL Ratio: 3.2 (calc) (ref <5.0)
Triglycerides: 92 mg/dL (ref <150)

## 2021-09-13 LAB — CBC WITH DIFFERENTIAL/PLATELET
Absolute Monocytes: 547 cells/uL (ref 200–950)
Basophils Absolute: 30 cells/uL (ref 0–200)
Basophils Relative: 0.4 %
Eosinophils Absolute: 99 cells/uL (ref 15–500)
Eosinophils Relative: 1.3 %
HCT: 45.5 % (ref 38.5–50.0)
Hemoglobin: 14.8 g/dL (ref 13.2–17.1)
Lymphs Abs: 4362 cells/uL — ABNORMAL HIGH (ref 850–3900)
MCH: 30.6 pg (ref 27.0–33.0)
MCHC: 32.5 g/dL (ref 32.0–36.0)
MCV: 94 fL (ref 80.0–100.0)
MPV: 10.3 fL (ref 7.5–12.5)
Monocytes Relative: 7.2 %
Neutro Abs: 2561 cells/uL (ref 1500–7800)
Neutrophils Relative %: 33.7 %
Platelets: 212 10*3/uL (ref 140–400)
RBC: 4.84 10*6/uL (ref 4.20–5.80)
RDW: 12.5 % (ref 11.0–15.0)
Total Lymphocyte: 57.4 %
WBC: 7.6 10*3/uL (ref 3.8–10.8)

## 2021-09-13 LAB — COMPREHENSIVE METABOLIC PANEL
AG Ratio: 1.7 (calc) (ref 1.0–2.5)
ALT: 16 U/L (ref 9–46)
AST: 21 U/L (ref 10–35)
Albumin: 4 g/dL (ref 3.6–5.1)
Alkaline phosphatase (APISO): 60 U/L (ref 35–144)
BUN: 23 mg/dL (ref 7–25)
CO2: 27 mmol/L (ref 20–32)
Calcium: 8.7 mg/dL (ref 8.6–10.3)
Chloride: 106 mmol/L (ref 98–110)
Creat: 1.22 mg/dL (ref 0.70–1.28)
Globulin: 2.3 g/dL (calc) (ref 1.9–3.7)
Glucose, Bld: 93 mg/dL (ref 65–99)
Potassium: 4.7 mmol/L (ref 3.5–5.3)
Sodium: 140 mmol/L (ref 135–146)
Total Bilirubin: 0.4 mg/dL (ref 0.2–1.2)
Total Protein: 6.3 g/dL (ref 6.1–8.1)

## 2021-09-13 LAB — PSA: PSA: <0.04 ng/mL (ref <=4.00)

## 2021-09-18 ENCOUNTER — Other Ambulatory Visit: Payer: Self-pay

## 2021-09-18 ENCOUNTER — Ambulatory Visit (INDEPENDENT_AMBULATORY_CARE_PROVIDER_SITE_OTHER): Payer: Medicare PPO | Admitting: Family Medicine

## 2021-09-18 VITALS — BP 138/78 | HR 76 | Temp 97.5°F | Resp 18 | Ht 75.0 in | Wt 209.0 lb

## 2021-09-18 DIAGNOSIS — Z8546 Personal history of malignant neoplasm of prostate: Secondary | ICD-10-CM | POA: Diagnosis not present

## 2021-09-18 DIAGNOSIS — Z0000 Encounter for general adult medical examination without abnormal findings: Secondary | ICD-10-CM | POA: Diagnosis not present

## 2021-09-18 DIAGNOSIS — N1831 Chronic kidney disease, stage 3a: Secondary | ICD-10-CM

## 2021-09-18 DIAGNOSIS — E7800 Pure hypercholesterolemia, unspecified: Secondary | ICD-10-CM | POA: Diagnosis not present

## 2021-09-18 MED ORDER — LORAZEPAM 1 MG PO TABS
ORAL_TABLET | ORAL | 0 refills | Status: DC
Start: 2021-09-18 — End: 2022-02-21

## 2021-09-18 NOTE — Progress Notes (Signed)
Subjective:    Patient ID: Dwayne Young, male    DOB: [birth year], 70 y.o.   MRN: [id number]  HPI Patient is here today for complete physical exam.  His last colonoscopy was performed in 2014 and was reportedly normal.  He is not due again for a colonoscopy until 2024.  Overall he is doing very well.  He is due for shingles vaccine.  The remainder of his immunizations are up-to-date including Pneumovax and Prevnar.  He has had most recent COVID vaccination and his flu shot.  He denies any falls or depression or memory loss.  Most recent lab work is listed below: Appointment on 09/13/2021  Component Date Value Ref Range Status   WBC 09/13/2021 7.6  3.8 - 10.8 Thousand/uL Final   RBC 09/13/2021 4.84  4.20 - 5.80 Million/uL Final   Hemoglobin 09/13/2021 14.8  13.2 - 17.1 g/dL Final   HCT 09/13/2021 45.5  38.5 - 50.0 % Final   MCV 09/13/2021 94.0  80.0 - 100.0 fL Final   MCH 09/13/2021 30.6  27.0 - 33.0 pg Final   MCHC 09/13/2021 32.5  32.0 - 36.0 g/dL Final   RDW 09/13/2021 12.5  11.0 - 15.0 % Final   Platelets 09/13/2021 212  140 - 400 Thousand/uL Final   MPV 09/13/2021 10.3  7.5 - 12.5 fL Final   Neutro Abs 09/13/2021 2,561  1,500 - 7,800 cells/uL Final   Lymphs Abs 09/13/2021 4,362 (H)  850 - 3,900 cells/uL Final   Absolute Monocytes 09/13/2021 547  200 - 950 cells/uL Final   Eosinophils Absolute 09/13/2021 99  15 - 500 cells/uL Final   Basophils Absolute 09/13/2021 30  0 - 200 cells/uL Final   Neutrophils Relative % 09/13/2021 33.7  % Final   Total Lymphocyte 09/13/2021 57.4  % Final   Monocytes Relative 09/13/2021 7.2  % Final   Eosinophils Relative 09/13/2021 1.3  % Final   Basophils Relative 09/13/2021 0.4  % Final   Glucose, Bld 09/13/2021 93  65 - 99 mg/dL Final   Comment: .            Fasting reference interval .    BUN 09/13/2021 23  7 - 25 mg/dL Final   Creat 09/13/2021 1.22  0.70 - 1.28 mg/dL Final   BUN/Creatinine Ratio 68/37/2902 NOT APPLICABLE  6 - 22 (calc) Final    Sodium 09/13/2021 140  135 - 146 mmol/L Final   Potassium 09/13/2021 4.7  3.5 - 5.3 mmol/L Final   Chloride 09/13/2021 106  98 - 110 mmol/L Final   CO2 09/13/2021 27  20 - 32 mmol/L Final   Calcium 09/13/2021 8.7  8.6 - 10.3 mg/dL Final   Total Protein 09/13/2021 6.3  6.1 - 8.1 g/dL Final   Albumin 09/13/2021 4.0  3.6 - 5.1 g/dL Final   Globulin 09/13/2021 2.3  1.9 - 3.7 g/dL (calc) Final   AG Ratio 09/13/2021 1.7  1.0 - 2.5 (calc) Final   Total Bilirubin 09/13/2021 0.4  0.2 - 1.2 mg/dL Final   Alkaline phosphatase (APISO) 09/13/2021 60  35 - 144 U/L Final   AST 09/13/2021 21  10 - 35 U/L Final   ALT 09/13/2021 16  9 - 46 U/L Final   PSA 09/13/2021 <0.04  < OR = 4.00 ng/mL Final   Comment: The total PSA value from this assay system is  standardized against the WHO standard. The test  result will be approximately 20% lower when compared  to the equimolar-standardized  total PSA (Beckman  Coulter). Comparison of serial PSA results should be  interpreted with this fact in mind. . This test was performed using the Siemens  chemiluminescent method. Values obtained from  different assay methods cannot be used interchangeably. PSA levels, regardless of value, should not be interpreted as absolute evidence of the presence or absence of disease.    Cholesterol 09/13/2021 139  <200 mg/dL Final   HDL 09/13/2021 43  > OR = 40 mg/dL Final   Triglycerides 09/13/2021 92  <150 mg/dL Final   LDL Cholesterol (Calc) 09/13/2021 78  mg/dL (calc) Final   Comment: Reference range: <100 . Desirable range <100 mg/dL for primary prevention;   <70 mg/dL for patients with CHD or diabetic patients  with > or = 2 CHD risk factors. Marland Kitchen LDL-C is now calculated using the Martin-Hopkins  calculation, which is a validated novel method providing  better accuracy than the Friedewald equation in the  estimation of LDL-C.  Cresenciano Genre et al. Annamaria Helling. 8341;962(22): 2061-2068   (http://education.[facility].com/faq/FAQ164)    Total CHOL/HDL Ratio 09/13/2021 3.2  <5.0 (calc) Final   Non-HDL Cholesterol (Calc) 09/13/2021 96  <130 mg/dL (calc) Final   Comment: For patients with diabetes plus 1 major ASCVD risk  factor, treating to a non-HDL-C goal of <100 mg/dL  (LDL-C of <70 mg/dL) is considered a therapeutic  option.     Immunization History  Administered Date(s) Administered   Fluad Quad(high Dose 65+) 06/30/2019, 07/17/2020   Hepatitis A 03/28/1999, 04/29/2011, 10/24/2011   IPV 04/03/1999   Influenza, High Dose Seasonal PF 07/28/2018   Influenza-Unspecified 08/13/2015, 07/14/2017, 08/28/2021   PFIZER(Purple Top)SARS-COV-2 Vaccination 11/19/2019, 12/14/2019, 07/20/2020   Pneumococcal Conjugate-13 04/04/2016   Pneumococcal Polysaccharide-23 04/20/2015   Tdap 04/29/2011   Typhoid Inactivated 04/03/1999, 05/01/2011   Yellow Fever 04/03/1999   Zoster, Live 03/15/2013   Past Surgical History:  Procedure Laterality Date   COLONOSCOPY N/A 04/27/2013   Procedure: COLONOSCOPY;  Surgeon: Jamesetta So, MD;  [facility];  Service: Gastroenterology;  Laterality: N/A;   GANGLION CYST EXCISION Right    KNEE ARTHROSCOPY Bilateral    open procedure   LYMPHADENECTOMY Bilateral 03/03/2014   Procedure: LYMPHADENECTOMY;  Surgeon: Raynelle Bring, MD;  Location: [facility];  Service: Urology;  Laterality: Bilateral;   NASAL SEPTOPLASTY W/ TURBINOPLASTY     PROSTATE SURGERY N/A    [employer] 07/08/2020   ROBOT ASSISTED LAPAROSCOPIC RADICAL PROSTATECTOMY N/A 03/03/2014   Procedure: ROBOTIC ASSISTED LAPAROSCOPIC RADICAL PROSTATECTOMY LEVEL 2;  Surgeon: Raynelle Bring, MD;  Location: [facility];  Service: Urology;  Laterality: N/A;   TONSILLECTOMY     child   TOOTH EXTRACTION     preparing for tooth implant   VASECTOMY N/A    [facility] 07/08/2020   Past Medical History:  Diagnosis Date   Arthritis    arthritis-generalized-knees, elbows   Elevated PSA     Hypercholesteremia    Hyperlipidemia    Prostate cancer ([place])    dx. prostate cancer after bx. 2'15-now surgery planned   Retinal detachment    Seasonal allergies    Current Outpatient Medications on File Prior to Visit  Medication Sig Dispense Refill   acetaminophen (TYLENOL) 650 MG CR tablet Take 1 tablet (650 mg total) by mouth every 8 (eight) hours as needed for pain.     diclofenac Sodium (VOLTAREN) 1 % GEL Apply 1 application topically daily as needed (pain).     Omega-3 Fatty Acids (FISH OIL) 500 MG CAPS Take 500 mg  by mouth daily.     rosuvastatin (CRESTOR) 5 MG tablet TAKE ONE TABLET BY MOUTH ONCE DAILY. 90 tablet 0   No current facility-administered medications on file prior to visit.   No Known Allergies Social History   Socioeconomic History   Marital status: Married    Spouse name: Not on file   Number of children: Not on file   Years of education: Not on file   Highest education level: Not on file  Occupational History   Not on file  Tobacco Use   Smoking status: Former    Packs/day: 0.50    Years: 15.00    Pack years: 7.50    Types: Cigarettes    Quit date: 04/27/1989    Years since quitting: 32.4   Smokeless tobacco: Never  Substance and Sexual Activity   Alcohol use: Yes    Alcohol/week: 1.0 standard drink    Types: 1 Glasses of wine per week    Comment: moderate - wine every 3 rd day   Drug use: No   Sexual activity: Yes    Birth control/protection: None  Other Topics Concern   Not on file  Social History Narrative   Not on file   Social Determinants of Health   Financial Resource Strain: Not on file  Food Insecurity: Not on file  Transportation Needs: Not on file  Physical Activity: Not on file  Stress: Not on file  Social Connections: Not on file  Intimate Partner Violence: Not on file      Review of Systems  All other systems reviewed and are negative.     Objective:   Physical Exam Vitals reviewed.  Constitutional:       General: He is not in acute distress.    Appearance: Normal appearance. He is normal weight. He is not ill-appearing, toxic-appearing or diaphoretic.  HENT:     Head: Normocephalic and atraumatic.     Right Ear: Tympanic membrane, ear canal and external ear normal. There is no impacted cerumen.     Left Ear: Tympanic membrane, ear canal and external ear normal. There is no impacted cerumen.     Nose: Nose normal. No congestion or rhinorrhea.     Mouth/Throat:     Mouth: Mucous membranes are moist.     Pharynx: Oropharynx is clear. No oropharyngeal exudate or posterior oropharyngeal erythema.  Eyes:     General: No scleral icterus.       Right eye: No discharge.        Left eye: No discharge.     Extraocular Movements: Extraocular movements intact.     Conjunctiva/sclera: Conjunctivae normal.     Pupils: Pupils are equal, round, and reactive to light.  Neck:     Vascular: No carotid bruit.  Cardiovascular:     Rate and Rhythm: Normal rate and regular rhythm.     Pulses: Normal pulses.     Heart sounds: Normal heart sounds. No murmur heard.   No friction rub. No gallop.  Pulmonary:     Effort: Pulmonary effort is normal. No respiratory distress.     Breath sounds: Normal breath sounds. No stridor. No wheezing, rhonchi or rales.  Chest:     Chest wall: No tenderness.  Abdominal:     General: Bowel sounds are normal. There is no distension.     Palpations: Abdomen is soft. There is no mass.     Tenderness: There is no abdominal tenderness. There is no right CVA tenderness, left CVA  tenderness, guarding or rebound.     Hernia: No hernia is present.  Musculoskeletal:     Cervical back: Normal range of motion and neck supple. No rigidity.     Right lower leg: No edema.     Left lower leg: No edema.  Lymphadenopathy:     Cervical: No cervical adenopathy.  Skin:    General: Skin is warm.     Coloration: Skin is not jaundiced or pale.     Findings: No bruising, erythema, lesion or  rash.  Neurological:     General: No focal deficit present.     Mental Status: He is alert and oriented to person, place, and time.     Cranial Nerves: No cranial nerve deficit.     Sensory: No sensory deficit.     Motor: No weakness.     Coordination: Coordination normal.     Gait: Gait normal.     Deep Tendon Reflexes: Reflexes normal.  Psychiatric:        Mood and Affect: Mood normal.        Behavior: Behavior normal.        Thought Content: Thought content normal.        Judgment: Judgment normal.          Assessment & Plan:  Pure hypercholesterolemia - Plan: CT CARDIAC SCORING (SELF PAY ONLY)  General medical exam  Stage 3a chronic kidney disease ([place])  Personal history of prostate cancer Patient is interested in stopping his Crestor.  His cholesterol is outstanding.  We discussed stopping it and rechecking labs in 6 months.  We also discussed coronary artery calcium scoring as a way to help determine whether he would benefit from taking a statin.  Patient is interested in pursuing coronary artery calcium scoring.  He would like to be able to discontinue a statin if his calcium score relatively low, this would justify discontinuing the medication indefinitely.  Therefore we will order the test and see how often.  His PSA is undetectable.  Remainder of his lab work is excellent.  I did recommend shingles vaccine

## 2021-09-28 ENCOUNTER — Other Ambulatory Visit: Payer: Self-pay

## 2021-09-28 ENCOUNTER — Ambulatory Visit (HOSPITAL_COMMUNITY)
Admission: RE | Admit: 2021-09-28 | Discharge: 2021-09-28 | Disposition: A | Discharge status: Home / Self Care | Payer: Self-pay | Source: Ambulatory Visit | Attending: Family Medicine | Admitting: Family Medicine

## 2021-09-28 DIAGNOSIS — E7800 Pure hypercholesterolemia, unspecified: Secondary | ICD-10-CM | POA: Insufficient documentation

## 2021-09-29 ENCOUNTER — Encounter: Payer: Self-pay | Admitting: Family Medicine

## 2021-11-08 ENCOUNTER — Encounter: Payer: Self-pay | Admitting: Family Medicine

## 2021-11-08 ENCOUNTER — Other Ambulatory Visit: Payer: Self-pay

## 2021-11-08 ENCOUNTER — Ambulatory Visit: Payer: Medicare PPO | Admitting: Family Medicine

## 2021-11-08 VITALS — BP 124/88 | HR 68 | Temp 97.3°F | Resp 18 | Ht 75.0 in | Wt 209.0 lb

## 2021-11-08 DIAGNOSIS — M549 Dorsalgia, unspecified: Secondary | ICD-10-CM | POA: Diagnosis not present

## 2021-11-08 MED ORDER — METHOCARBAMOL 500 MG PO TABS: 500.0000 mg | ORAL_TABLET | Freq: Four times a day (QID) | ORAL | 0 refills | Status: DC | PRN

## 2021-11-08 MED ORDER — PREDNISONE 20 MG PO TABS: ORAL_TABLET | ORAL | 0 refills | Status: DC

## 2021-11-08 NOTE — Progress Notes (Signed)
Subjective:    Patient ID: Dwayne Young, male    DOB: [birth year], 71 y.o.   MRN: [id number]  HPI Reports pain in the middle of his back roughly around the level of T10.  The pain is constant and boring in nature.  He also reports spasms to the right of his spine and his right flank.  He reports feeling that his back will tighten up and sees on him.  The only thing that makes it better is for him to put a hard object behind his back and lean against a wall, stretching the muscle.  He denies any falls or injuries.  He denies any saddle anesthesia or bowel or bladder incontinence or weakness in the legs or sciatica.  He denies any fevers or chills or dysuria or hematuria.  He denies any cough or pleurisy or hemoptysis Past Medical History:  Diagnosis Date   Arthritis    arthritis-generalized-knees, elbows   Elevated PSA    Hypercholesteremia    Hyperlipidemia    Prostate cancer ([place])    dx. prostate cancer after bx. 2'15-now surgery planned   Retinal detachment    Seasonal allergies    Past Surgical History:  Procedure Laterality Date   COLONOSCOPY N/A 04/27/2013   Procedure: COLONOSCOPY;  Surgeon: Jamesetta So, MD;  [facility];  Service: Gastroenterology;  Laterality: N/A;   GANGLION CYST EXCISION Right    KNEE ARTHROSCOPY Bilateral    open procedure   LYMPHADENECTOMY Bilateral 03/03/2014   Procedure: LYMPHADENECTOMY;  Surgeon: Raynelle Bring, MD;  [facility];  Service: Urology;  Laterality: Bilateral;   NASAL SEPTOPLASTY W/ TURBINOPLASTY     PROSTATE SURGERY N/A    [facility] 07/08/2020   ROBOT ASSISTED LAPAROSCOPIC RADICAL PROSTATECTOMY N/A 03/03/2014   Procedure: ROBOTIC ASSISTED LAPAROSCOPIC RADICAL PROSTATECTOMY LEVEL 2;  Surgeon: Raynelle Bring, MD;  [facility];  Service: Urology;  Laterality: N/A;   TONSILLECTOMY     child   TOOTH EXTRACTION     preparing for tooth implant   VASECTOMY N/A    [facility] 07/08/2020   Current Outpatient Medications on  File Prior to Visit  Medication Sig Dispense Refill   diclofenac Sodium (VOLTAREN) 1 % GEL Apply 1 application topically daily as needed (pain).     LORazepam (ATIVAN) 1 MG tablet TAKE (1) TABLET BY MOUTH TWICE DAILY AS NEEDED FOR ANXIETY. 20 tablet 0   Omega-3 Fatty Acids (FISH OIL) 500 MG CAPS Take 500 mg by mouth daily.     acetaminophen (TYLENOL) 650 MG CR tablet Take 1 tablet (650 mg total) by mouth every 8 (eight) hours as needed for pain. (Patient not taking: Reported on 11/08/2021)     No current facility-administered medications on file prior to visit.   No Known Allergies    Review of Systems     Objective:   Physical Exam Constitutional:      Appearance: Normal appearance. He is normal weight.  Cardiovascular:     Rate and Rhythm: Normal rate and regular rhythm.     Heart sounds: Normal heart sounds. No murmur heard.   No friction rub. No gallop.  Pulmonary:     Effort: Pulmonary effort is normal. No respiratory distress.     Breath sounds: Normal breath sounds. No stridor. No wheezing, rhonchi or rales.  Musculoskeletal:     Thoracic back: Spasms and tenderness present. No bony tenderness. Decreased range of motion.       Back:  Neurological:  Mental Status: He is alert.          Assessment & Plan:  Mid back pain on right side - Plan: DG Thoracic Spine W/Swimmers Patient is having moderate to severe mid back pain.  He denies any injuries or falls.  Obtain an x-ray given his history of prostate cancer.  He is unable to take NSAIDs due to chronic kidney disease.  Therefore we will try prednisone taper pack coupled with Robaxin 500 mg every 6 hours as needed for muscle spasms.  Recheck next week if no better or sooner if worse

## 2021-11-12 ENCOUNTER — Ambulatory Visit
Admission: RE | Admit: 2021-11-12 | Discharge: 2021-11-12 | Disposition: A | Discharge status: Home / Self Care | Payer: Medicare PPO | Source: Ambulatory Visit | Attending: Family Medicine | Admitting: Family Medicine

## 2021-11-12 DIAGNOSIS — M546 Pain in thoracic spine: Secondary | ICD-10-CM | POA: Diagnosis not present

## 2021-11-12 DIAGNOSIS — M549 Dorsalgia, unspecified: Secondary | ICD-10-CM

## 2021-11-28 ENCOUNTER — Encounter: Payer: Self-pay | Admitting: Family Medicine

## 2021-11-28 ENCOUNTER — Other Ambulatory Visit: Payer: Self-pay | Admitting: Family Medicine

## 2021-11-28 MED ORDER — NIRMATRELVIR/RITONAVIR (PAXLOVID)TABLET: 3.0000 | ORAL_TABLET | Freq: Two times a day (BID) | ORAL | 0 refills | Status: AC

## 2022-02-18 ENCOUNTER — Other Ambulatory Visit: Payer: Medicare PPO

## 2022-02-18 DIAGNOSIS — Z8546 Personal history of malignant neoplasm of prostate: Secondary | ICD-10-CM

## 2022-02-18 DIAGNOSIS — E7800 Pure hypercholesterolemia, unspecified: Secondary | ICD-10-CM | POA: Diagnosis not present

## 2022-02-18 DIAGNOSIS — N1831 Chronic kidney disease, stage 3a: Secondary | ICD-10-CM | POA: Diagnosis not present

## 2022-02-19 LAB — COMPREHENSIVE METABOLIC PANEL
AG Ratio: 1.5 (calc) (ref 1.0–2.5)
ALT: 12 U/L (ref 9–46)
AST: 15 U/L (ref 10–35)
Albumin: 3.8 g/dL (ref 3.6–5.1)
Alkaline phosphatase (APISO): 64 U/L (ref 35–144)
BUN: 22 mg/dL (ref 7–25)
CO2: 26 mmol/L (ref 20–32)
Calcium: 8.7 mg/dL (ref 8.6–10.3)
Chloride: 107 mmol/L (ref 98–110)
Creat: 1.26 mg/dL (ref 0.70–1.28)
Globulin: 2.5 g/dL (calc) (ref 1.9–3.7)
Glucose, Bld: 96 mg/dL (ref 65–99)
Potassium: 4.6 mmol/L (ref 3.5–5.3)
Sodium: 140 mmol/L (ref 135–146)
Total Bilirubin: 0.4 mg/dL (ref 0.2–1.2)
Total Protein: 6.3 g/dL (ref 6.1–8.1)

## 2022-02-19 LAB — CBC WITH DIFFERENTIAL/PLATELET
Absolute Monocytes: 470 cells/uL (ref 200–950)
Basophils Absolute: 31 cells/uL (ref 0–200)
Basophils Relative: 0.4 %
Eosinophils Absolute: 123 cells/uL (ref 15–500)
Eosinophils Relative: 1.6 %
HCT: 47 % (ref 38.5–50.0)
Hemoglobin: 15.4 g/dL (ref 13.2–17.1)
Lymphs Abs: 4250 cells/uL — ABNORMAL HIGH (ref 850–3900)
MCH: 30.9 pg (ref 27.0–33.0)
MCHC: 32.8 g/dL (ref 32.0–36.0)
MCV: 94.4 fL (ref 80.0–100.0)
MPV: 10 fL (ref 7.5–12.5)
Monocytes Relative: 6.1 %
Neutro Abs: 2826 cells/uL (ref 1500–7800)
Neutrophils Relative %: 36.7 %
Platelets: 225 10*3/uL (ref 140–400)
RBC: 4.98 10*6/uL (ref 4.20–5.80)
RDW: 12.8 % (ref 11.0–15.0)
Total Lymphocyte: 55.2 %
WBC: 7.7 10*3/uL (ref 3.8–10.8)

## 2022-02-19 LAB — LIPID PANEL
Cholesterol: 175 mg/dL (ref <200)
HDL: 47 mg/dL (ref >=40)
LDL Cholesterol (Calc): 110 mg/dL (calc) — ABNORMAL HIGH
Non-HDL Cholesterol (Calc): 128 mg/dL (calc) (ref <130)
Total CHOL/HDL Ratio: 3.7 (calc) (ref <5.0)
Triglycerides: 90 mg/dL (ref <150)

## 2022-02-19 LAB — PSA: PSA: <0.04 ng/mL (ref <=4.00)

## 2022-02-21 ENCOUNTER — Ambulatory Visit (INDEPENDENT_AMBULATORY_CARE_PROVIDER_SITE_OTHER): Payer: Medicare PPO | Admitting: Family Medicine

## 2022-02-21 VITALS — BP 130/72 | HR 70 | Temp 98.6°F | Ht 75.0 in | Wt 206.8 lb

## 2022-02-21 DIAGNOSIS — Z8546 Personal history of malignant neoplasm of prostate: Secondary | ICD-10-CM

## 2022-02-21 DIAGNOSIS — N1831 Chronic kidney disease, stage 3a: Secondary | ICD-10-CM

## 2022-02-21 DIAGNOSIS — E7800 Pure hypercholesterolemia, unspecified: Secondary | ICD-10-CM | POA: Diagnosis not present

## 2022-02-21 MED ORDER — LORAZEPAM 1 MG PO TABS: ORAL_TABLET | ORAL | 0 refills | Status: DC

## 2022-02-21 NOTE — Progress Notes (Signed)
? ?Subjective:  ? ? Patient ID: Dwayne Young, male    DOB: [birth year], 71 y.o.   MRN: [id number] ? ?HPI ?In December, coronary artery calcium score was 0!  As a result, patient stopped his statin.  Most recent labs are listed below.  ? ?Lab on 02/18/2022  ?Component Date Value Ref Range Status  ? PSA 02/18/2022 <0.04  < OR = 4.00 ng/mL Final  ? Comment: The total PSA value from this assay system is  ?standardized against the WHO standard. The test  ?result will be approximately 20% lower when compared  ?to the equimolar-standardized total PSA (Beckman  ?Coulter). Comparison of serial PSA results should be  ?interpreted with this fact in mind. ?. ?This test was performed using the Siemens  ?chemiluminescent method. Values obtained from  ?different assay methods cannot be used ?interchangeably. PSA levels, regardless of ?value, should not be interpreted as absolute ?evidence of the presence or absence of disease. ?  ? Cholesterol 02/18/2022 175  <200 mg/dL Final  ? HDL 02/18/2022 47  > OR = 40 mg/dL Final  ? Triglycerides 02/18/2022 90  <150 mg/dL Final  ? LDL Cholesterol (Calc) 02/18/2022 110 (H)  mg/dL (calc) Final  ? Comment: Reference range: <100 ?Marland Kitchen ?Desirable range <100 mg/dL for primary prevention;   ?<70 mg/dL for patients with CHD or diabetic patients  ?with > or = 2 CHD risk factors. ?. ?LDL-C is now calculated using the Martin-Hopkins  ?calculation, which is a validated novel method providing  ?better accuracy than the Friedewald equation in the  ?estimation of LDL-C.  ?Cresenciano Genre et al. Annamaria Helling. 8242;353(61): 2061-2068  ?(http://education.[facility].com/faq/FAQ164) ?  ? Total CHOL/HDL Ratio 02/18/2022 3.7  <5.0 (calc) Final  ? Non-HDL Cholesterol (Calc) 02/18/2022 128  <130 mg/dL (calc) Final  ? Comment: For patients with diabetes plus 1 major ASCVD risk  ?factor, treating to a non-HDL-C goal of <100 mg/dL  ?(LDL-C of <70 mg/dL) is considered a therapeutic  ?option. ?  ? WBC 02/18/2022 7.7  3.8 - 10.8  Thousand/uL Final  ? RBC 02/18/2022 4.98  4.20 - 5.80 Million/uL Final  ? Hemoglobin 02/18/2022 15.4  13.2 - 17.1 g/dL Final  ? HCT 02/18/2022 47.0  38.5 - 50.0 % Final  ? MCV 02/18/2022 94.4  80.0 - 100.0 fL Final  ? MCH 02/18/2022 30.9  27.0 - 33.0 pg Final  ? MCHC 02/18/2022 32.8  32.0 - 36.0 g/dL Final  ? RDW 02/18/2022 12.8  11.0 - 15.0 % Final  ? Platelets 02/18/2022 225  140 - 400 Thousand/uL Final  ? MPV 02/18/2022 10.0  7.5 - 12.5 fL Final  ? Neutro Abs 02/18/2022 2,826  1,500 - 7,800 cells/uL Final  ? Lymphs Abs 02/18/2022 4,250 (H)  850 - 3,900 cells/uL Final  ? Absolute Monocytes 02/18/2022 470  200 - 950 cells/uL Final  ? Eosinophils Absolute 02/18/2022 123  15 - 500 cells/uL Final  ? Basophils Absolute 02/18/2022 31  0 - 200 cells/uL Final  ? Neutrophils Relative % 02/18/2022 36.7  % Final  ? Total Lymphocyte 02/18/2022 55.2  % Final  ? Monocytes Relative 02/18/2022 6.1  % Final  ? Eosinophils Relative 02/18/2022 1.6  % Final  ? Basophils Relative 02/18/2022 0.4  % Final  ? Glucose, Bld 02/18/2022 96  65 - 99 mg/dL Final  ? Comment: . ?           Fasting reference interval ?. ?  ? BUN 02/18/2022 22  7 - 25 mg/dL Final  ?  Creat 02/18/2022 1.26  0.70 - 1.28 mg/dL Final  ? BUN/Creatinine Ratio 86/57/8469 NOT APPLICABLE  6 - 22 (calc) Final  ? Sodium 02/18/2022 140  135 - 146 mmol/L Final  ? Potassium 02/18/2022 4.6  3.5 - 5.3 mmol/L Final  ? Chloride 02/18/2022 107  98 - 110 mmol/L Final  ? CO2 02/18/2022 26  20 - 32 mmol/L Final  ? Calcium 02/18/2022 8.7  8.6 - 10.3 mg/dL Final  ? Total Protein 02/18/2022 6.3  6.1 - 8.1 g/dL Final  ? Albumin 02/18/2022 3.8  3.6 - 5.1 g/dL Final  ? Globulin 02/18/2022 2.5  1.9 - 3.7 g/dL (calc) Final  ? AG Ratio 02/18/2022 1.5  1.0 - 2.5 (calc) Final  ? Total Bilirubin 02/18/2022 0.4  0.2 - 1.2 mg/dL Final  ? Alkaline phosphatase (APISO) 02/18/2022 64  35 - 144 U/L Final  ? AST 02/18/2022 15  10 - 35 U/L Final  ? ALT 02/18/2022 12  9 - 46 U/L Final  ? ? ? ?Past Surgical  History:  ?Procedure Laterality Date  ? COLONOSCOPY N/A 04/27/2013  ? Procedure: COLONOSCOPY;  Surgeon: Jamesetta So, MD;  [facility];  Service: Gastroenterology;  Laterality: N/A;  ? GANGLION CYST EXCISION Right   ? KNEE ARTHROSCOPY Bilateral   ? open procedure  ? LYMPHADENECTOMY Bilateral 03/03/2014  ? Procedure: LYMPHADENECTOMY;  Surgeon: Raynelle Bring, MD;  [facility];  Service: Urology;  Laterality: Bilateral;  ? NASAL SEPTOPLASTY W/ TURBINOPLASTY    ? PROSTATE SURGERY N/A   ? [facility] 07/08/2020  ? ROBOT ASSISTED LAPAROSCOPIC RADICAL PROSTATECTOMY N/A 03/03/2014  ? Procedure: ROBOTIC ASSISTED LAPAROSCOPIC RADICAL PROSTATECTOMY LEVEL 2;  Surgeon: Raynelle Bring, MD;  [facility];  Service: Urology;  Laterality: N/A;  ? TONSILLECTOMY    ? child  ? TOOTH EXTRACTION    ? preparing for tooth implant  ? VASECTOMY N/A   ? [facility] 07/08/2020  ? ?Past Medical History:  ?Diagnosis Date  ? Arthritis   ? arthritis-generalized-knees, elbows  ? Elevated PSA   ? Hypercholesteremia   ? Hyperlipidemia   ? Prostate cancer ([place])   ? dx. prostate cancer after bx. 2'15-now surgery planned  ? Retinal detachment   ? Seasonal allergies   ? ?Current Outpatient Medications on File Prior to Visit  ?Medication Sig Dispense Refill  ? acetaminophen (TYLENOL) 650 MG CR tablet Take 1 tablet (650 mg total) by mouth every 8 (eight) hours as needed for pain. (Patient not taking: Reported on 11/08/2021)    ? diclofenac Sodium (VOLTAREN) 1 % GEL Apply 1 application topically daily as needed (pain).    ? LORazepam (ATIVAN) 1 MG tablet TAKE (1) TABLET BY MOUTH TWICE DAILY AS NEEDED FOR ANXIETY. 20 tablet 0  ? methocarbamol (ROBAXIN) 500 MG tablet Take 1 tablet (500 mg total) by mouth every 6 (six) hours as needed for muscle spasms. 30 tablet 0  ? Omega-3 Fatty Acids (FISH OIL) 500 MG CAPS Take 500 mg by mouth daily.    ? predniSONE (DELTASONE) 20 MG tablet 3 tabs poqday 1-2, 2 tabs poqday 3-4, 1 tab poqday 5-6 12 tablet 0   ? ?No current facility-administered medications on file prior to visit.  ? ?No Known Allergies ?Social History  ? ?Socioeconomic History  ? Marital status: Married  ?  Spouse name: Not on file  ? Number of children: Not on file  ? Years of education: Not on file  ? Highest education level: Not on file  ?  Occupational History  ? Not on file  ?Tobacco Use  ? Smoking status: Former  ?  Packs/day: 0.50  ?  Years: 15.00  ?  Pack years: 7.50  ?  Types: Cigarettes  ?  Quit date: 04/27/1989  ?  Years since quitting: 32.8  ? Smokeless tobacco: Never  ?Substance and Sexual Activity  ? Alcohol use: Yes  ?  Alcohol/week: 1.0 standard drink  ?  Types: 1 Glasses of wine per week  ?  Comment: moderate - wine every 3 rd day  ? Drug use: No  ? Sexual activity: Yes  ?  Birth control/protection: None  ?Other Topics Concern  ? Not on file  ?Social History Narrative  ? Not on file  ? ?Social Determinants of Health  ? ?Financial Resource Strain: Not on file  ?Food Insecurity: Not on file  ?Transportation Needs: Not on file  ?Physical Activity: Not on file  ?Stress: Not on file  ?Social Connections: Not on file  ?Intimate Partner Violence: Not on file  ? ? ? ? ?Review of Systems  ?All other systems reviewed and are negative. ? ?   ?Objective:  ? Physical Exam ?Vitals reviewed.  ?Constitutional:   ?   General: He is not in acute distress. ?   Appearance: Normal appearance. He is normal weight. He is not ill-appearing, toxic-appearing or diaphoretic.  ?HENT:  ?   Head: Normocephalic and atraumatic.  ?   Right Ear: Tympanic membrane, ear canal and external ear normal. There is no impacted cerumen.  ?   Left Ear: Tympanic membrane, ear canal and external ear normal. There is no impacted cerumen.  ?   Nose: Nose normal. No congestion or rhinorrhea.  ?   Mouth/Throat:  ?   Mouth: Mucous membranes are moist.  ?   Pharynx: Oropharynx is clear. No oropharyngeal exudate or posterior oropharyngeal erythema.  ?Eyes:  ?   General: No scleral icterus.     ?   Right eye: No discharge.     ?   Left eye: No discharge.  ?   Extraocular Movements: Extraocular movements intact.  ?   Conjunctiva/sclera: Conjunctivae normal.  ?   Pupils: Pupils are equal, round, an

## 2022-03-07 DIAGNOSIS — Z1283 Encounter for screening for malignant neoplasm of skin: Secondary | ICD-10-CM | POA: Diagnosis not present

## 2022-03-07 DIAGNOSIS — D225 Melanocytic nevi of trunk: Secondary | ICD-10-CM | POA: Diagnosis not present

## 2022-03-07 DIAGNOSIS — L570 Actinic keratosis: Secondary | ICD-10-CM | POA: Diagnosis not present

## 2022-03-07 DIAGNOSIS — L720 Epidermal cyst: Secondary | ICD-10-CM | POA: Diagnosis not present

## 2022-03-07 DIAGNOSIS — X32XXXD Exposure to sunlight, subsequent encounter: Secondary | ICD-10-CM | POA: Diagnosis not present

## 2022-06-18 DIAGNOSIS — L430 Hypertrophic lichen planus: Secondary | ICD-10-CM | POA: Diagnosis not present

## 2022-06-18 DIAGNOSIS — B078 Other viral warts: Secondary | ICD-10-CM | POA: Diagnosis not present

## 2022-06-18 DIAGNOSIS — L814 Other melanin hyperpigmentation: Secondary | ICD-10-CM | POA: Diagnosis not present

## 2022-06-18 DIAGNOSIS — D485 Neoplasm of uncertain behavior of skin: Secondary | ICD-10-CM | POA: Diagnosis not present

## 2022-06-18 DIAGNOSIS — X32XXXD Exposure to sunlight, subsequent encounter: Secondary | ICD-10-CM | POA: Diagnosis not present

## 2022-06-18 DIAGNOSIS — L570 Actinic keratosis: Secondary | ICD-10-CM | POA: Diagnosis not present

## 2022-09-16 ENCOUNTER — Other Ambulatory Visit: Payer: Medicare PPO

## 2022-09-16 DIAGNOSIS — N1831 Chronic kidney disease, stage 3a: Secondary | ICD-10-CM

## 2022-09-16 DIAGNOSIS — E785 Hyperlipidemia, unspecified: Secondary | ICD-10-CM | POA: Diagnosis not present

## 2022-09-16 DIAGNOSIS — C61 Malignant neoplasm of prostate: Secondary | ICD-10-CM

## 2022-09-16 DIAGNOSIS — R972 Elevated prostate specific antigen [PSA]: Secondary | ICD-10-CM

## 2022-09-17 LAB — CBC WITH DIFFERENTIAL/PLATELET
Absolute Monocytes: 427 cells/uL (ref 200–950)
Basophils Absolute: 28 cells/uL (ref 0–200)
Basophils Relative: 0.4 %
Eosinophils Absolute: 140 cells/uL (ref 15–500)
Eosinophils Relative: 2 %
HCT: 46.1 % (ref 38.5–50.0)
Hemoglobin: 15.4 g/dL (ref 13.2–17.1)
Lymphs Abs: 3731 cells/uL (ref 850–3900)
MCH: 31.1 pg (ref 27.0–33.0)
MCHC: 33.4 g/dL (ref 32.0–36.0)
MCV: 93.1 fL (ref 80.0–100.0)
MPV: 10.4 fL (ref 7.5–12.5)
Monocytes Relative: 6.1 %
Neutro Abs: 2674 cells/uL (ref 1500–7800)
Neutrophils Relative %: 38.2 %
Platelets: 219 10*3/uL (ref 140–400)
RBC: 4.95 10*6/uL (ref 4.20–5.80)
RDW: 12.7 % (ref 11.0–15.0)
Total Lymphocyte: 53.3 %
WBC: 7 10*3/uL (ref 3.8–10.8)

## 2022-09-17 LAB — COMPLETE METABOLIC PANEL WITH GFR
AG Ratio: 1.5 (calc) (ref 1.0–2.5)
ALT: 12 U/L (ref 9–46)
AST: 17 U/L (ref 10–35)
Albumin: 3.9 g/dL (ref 3.6–5.1)
Alkaline phosphatase (APISO): 74 U/L (ref 35–144)
BUN/Creatinine Ratio: 14 (calc) (ref 6–22)
BUN: 19 mg/dL (ref 7–25)
CO2: 28 mmol/L (ref 20–32)
Calcium: 8.8 mg/dL (ref 8.6–10.3)
Chloride: 106 mmol/L (ref 98–110)
Creat: 1.33 mg/dL — ABNORMAL HIGH (ref 0.70–1.28)
Globulin: 2.6 g/dL (calc) (ref 1.9–3.7)
Glucose, Bld: 90 mg/dL (ref 65–99)
Potassium: 4.6 mmol/L (ref 3.5–5.3)
Sodium: 140 mmol/L (ref 135–146)
Total Bilirubin: 0.4 mg/dL (ref 0.2–1.2)
Total Protein: 6.5 g/dL (ref 6.1–8.1)
eGFR: 57 mL/min/{1.73_m2} — ABNORMAL LOW (ref >=60)

## 2022-09-17 LAB — LIPID PANEL
Cholesterol: 189 mg/dL (ref <200)
HDL: 44 mg/dL (ref >=40)
LDL Cholesterol (Calc): 121 mg/dL (calc) — ABNORMAL HIGH
Non-HDL Cholesterol (Calc): 145 mg/dL (calc) — ABNORMAL HIGH (ref <130)
Total CHOL/HDL Ratio: 4.3 (calc) (ref <5.0)
Triglycerides: 127 mg/dL (ref <150)

## 2022-09-17 LAB — PSA: PSA: <0.04 ng/mL (ref <=4.00)

## 2022-09-20 ENCOUNTER — Ambulatory Visit (INDEPENDENT_AMBULATORY_CARE_PROVIDER_SITE_OTHER): Payer: Medicare PPO | Admitting: Family Medicine

## 2022-09-20 VITALS — BP 120/70 | HR 62 | Ht 75.0 in | Wt 204.2 lb

## 2022-09-20 DIAGNOSIS — E7800 Pure hypercholesterolemia, unspecified: Secondary | ICD-10-CM

## 2022-09-20 DIAGNOSIS — Z8546 Personal history of malignant neoplasm of prostate: Secondary | ICD-10-CM | POA: Diagnosis not present

## 2022-09-20 DIAGNOSIS — Z0000 Encounter for general adult medical examination without abnormal findings: Secondary | ICD-10-CM

## 2022-09-20 DIAGNOSIS — N1831 Chronic kidney disease, stage 3a: Secondary | ICD-10-CM | POA: Diagnosis not present

## 2022-09-20 MED ORDER — LORAZEPAM 1 MG PO TABS: ORAL_TABLET | ORAL | 1 refills | Status: DC

## 2022-09-20 MED ORDER — OMEGA-3-ACID ETHYL ESTERS 1 G PO CAPS: 2.0000 g | ORAL_CAPSULE | Freq: Every day | ORAL | 3 refills | Status: DC

## 2022-09-20 NOTE — Progress Notes (Signed)
Subjective:    Patient ID: Dwayne Young, male    DOB: [birth year], 71 y.o.   MRN: [id number]  HPI Patient is a very pleasant 71 year old Caucasian gentleman here today for complete physical exam.  He is under tremendous stress in his family.  Recently his daughter-in-law left his son.  As a result, they are having issues with visitation with her grandchildren.  This is causing tremendous stress in their family.  Other than that he is doing well.  His last colonoscopy was 2014 and is due again in 2024.  His recent PSA was checked he does history of stage IIIa chronic kidney disease but this is stable.  He denies any chest pain shortness of breath or dyspnea on exertion.  He denies any falls or memory loss or depression  Most recent lab work is listed below: Lab on 09/16/2022  Component Date Value Ref Range Status   Glucose, Bld 09/16/2022 90  65 - 99 mg/dL Final   Comment: .            Fasting reference interval .    BUN 09/16/2022 19  7 - 25 mg/dL Final   Creat 09/16/2022 1.33 (H)  0.70 - 1.28 mg/dL Final   eGFR 09/16/2022 57 (L)  > OR = 60 mL/min/1.69m Final   BUN/Creatinine Ratio 09/16/2022 14  6 - 22 (calc) Final   Sodium 09/16/2022 140  135 - 146 mmol/L Final   Potassium 09/16/2022 4.6  3.5 - 5.3 mmol/L Final   Chloride 09/16/2022 106  98 - 110 mmol/L Final   CO2 09/16/2022 28  20 - 32 mmol/L Final   Calcium 09/16/2022 8.8  8.6 - 10.3 mg/dL Final   Total Protein 09/16/2022 6.5  6.1 - 8.1 g/dL Final   Albumin 09/16/2022 3.9  3.6 - 5.1 g/dL Final   Globulin 09/16/2022 2.6  1.9 - 3.7 g/dL (calc) Final   AG Ratio 09/16/2022 1.5  1.0 - 2.5 (calc) Final   Total Bilirubin 09/16/2022 0.4  0.2 - 1.2 mg/dL Final   Alkaline phosphatase (APISO) 09/16/2022 74  35 - 144 U/L Final   AST 09/16/2022 17  10 - 35 U/L Final   ALT 09/16/2022 12  9 - 46 U/L Final   WBC 09/16/2022 7.0  3.8 - 10.8 Thousand/uL Final   RBC 09/16/2022 4.95  4.20 - 5.80 Million/uL Final   Hemoglobin 09/16/2022 15.4   13.2 - 17.1 g/dL Final   HCT 09/16/2022 46.1  38.5 - 50.0 % Final   MCV 09/16/2022 93.1  80.0 - 100.0 fL Final   MCH 09/16/2022 31.1  27.0 - 33.0 pg Final   MCHC 09/16/2022 33.4  32.0 - 36.0 g/dL Final   RDW 09/16/2022 12.7  11.0 - 15.0 % Final   Platelets 09/16/2022 219  140 - 400 Thousand/uL Final   MPV 09/16/2022 10.4  7.5 - 12.5 fL Final   Neutro Abs 09/16/2022 2,674  1,500 - 7,800 cells/uL Final   Lymphs Abs 09/16/2022 3,731  850 - 3,900 cells/uL Final   Absolute Monocytes 09/16/2022 427  200 - 950 cells/uL Final   Eosinophils Absolute 09/16/2022 140  15 - 500 cells/uL Final   Basophils Absolute 09/16/2022 28  0 - 200 cells/uL Final   Neutrophils Relative % 09/16/2022 38.2  % Final   Total Lymphocyte 09/16/2022 53.3  % Final   Monocytes Relative 09/16/2022 6.1  % Final   Eosinophils Relative 09/16/2022 2.0  % Final   Basophils Relative 09/16/2022 0.4  %  Final   Cholesterol 09/16/2022 189  <200 mg/dL Final   HDL 09/16/2022 44  > OR = 40 mg/dL Final   Triglycerides 09/16/2022 127  <150 mg/dL Final   LDL Cholesterol (Calc) 09/16/2022 121 (H)  mg/dL (calc) Final   Comment: Reference range: <100 . Desirable range <100 mg/dL for primary prevention;   <70 mg/dL for patients with CHD or diabetic patients  with > or = 2 CHD risk factors. Marland Kitchen LDL-C is now calculated using the Martin-Hopkins  calculation, which is a validated novel method providing  better accuracy than the Friedewald equation in the  estimation of LDL-C.  Cresenciano Genre et al. Annamaria Helling. 9381;829(93): 2061-2068  (http://education.[facility].com/faq/FAQ164)    Total CHOL/HDL Ratio 09/16/2022 4.3  <5.0 (calc) Final   Non-HDL Cholesterol (Calc) 09/16/2022 145 (H)  <130 mg/dL (calc) Final   Comment: For patients with diabetes plus 1 major ASCVD risk  factor, treating to a non-HDL-C goal of <100 mg/dL  (LDL-C of <70 mg/dL) is considered a therapeutic  option.    PSA 09/16/2022 <0.04  < OR = 4.00 ng/mL Final   Comment: The  total PSA value from this assay system is  standardized against the WHO standard. The test  result will be approximately 20% lower when compared  to the equimolar-standardized total PSA (Beckman  Coulter). Comparison of serial PSA results should be  interpreted with this fact in mind. . This test was performed using the Siemens  chemiluminescent method. Values obtained from  different assay methods cannot be used interchangeably. PSA levels, regardless of value, should not be interpreted as absolute evidence of the presence or absence of disease.     Immunization History  Administered Date(s) Administered   Fluad Quad(high Dose 65+) 06/30/2019, 07/17/2020   Hepatitis A 03/28/1999, 04/29/2011, 10/24/2011   IPV 04/03/1999   Influenza, High Dose Seasonal PF 07/28/2018   Influenza-Unspecified 08/13/2015, 07/14/2017, 08/28/2021   PFIZER(Purple Top)SARS-COV-2 Vaccination 11/19/2019, 12/14/2019, 07/20/2020   Pneumococcal Conjugate-13 04/04/2016   Pneumococcal Polysaccharide-23 04/20/2015   Tdap 04/29/2011   Typhoid Inactivated 04/03/1999, 05/01/2011   Yellow Fever 04/03/1999   Zoster, Live 03/15/2013   Past Surgical History:  Procedure Laterality Date   COLONOSCOPY N/A 04/27/2013   Procedure: COLONOSCOPY;  Surgeon: Jamesetta So, MD;  [facility];  Service: Gastroenterology;  Laterality: N/A;   GANGLION CYST EXCISION Right    KNEE ARTHROSCOPY Bilateral    open procedure   LYMPHADENECTOMY Bilateral 03/03/2014   Procedure: LYMPHADENECTOMY;  Surgeon: Raynelle Bring, MD;  Location: [facility];  Service: Urology;  Laterality: Bilateral;   NASAL SEPTOPLASTY W/ TURBINOPLASTY     PROSTATE SURGERY N/A    [facility] 07/08/2020   ROBOT ASSISTED LAPAROSCOPIC RADICAL PROSTATECTOMY N/A 03/03/2014   Procedure: ROBOTIC ASSISTED LAPAROSCOPIC RADICAL PROSTATECTOMY LEVEL 2;  Surgeon: Raynelle Bring, MD;  Location: [facility];  Service: Urology;  Laterality: N/A;   TONSILLECTOMY     child   TOOTH  EXTRACTION     preparing for tooth implant   VASECTOMY N/A    [facility] 07/08/2020   Past Medical History:  Diagnosis Date   Arthritis    arthritis-generalized-knees, elbows   Elevated PSA    Hypercholesteremia    Hyperlipidemia    Prostate cancer ([place])    dx. prostate cancer after bx. 2'15-now surgery planned   Retinal detachment    Seasonal allergies    Current Outpatient Medications on File Prior to Visit  Medication Sig Dispense Refill   acetaminophen (TYLENOL) 650 MG CR tablet Take  1 tablet (650 mg total) by mouth every 8 (eight) hours as needed for pain.     diclofenac Sodium (VOLTAREN) 1 % GEL Apply 1 application topically daily as needed (pain).     LORazepam (ATIVAN) 1 MG tablet TAKE (1) TABLET BY MOUTH TWICE DAILY AS NEEDED FOR ANXIETY. 20 tablet 0   methocarbamol (ROBAXIN) 500 MG tablet Take 1 tablet (500 mg total) by mouth every 6 (six) hours as needed for muscle spasms. 30 tablet 0   Omega-3 Fatty Acids (FISH OIL) 500 MG CAPS Take 500 mg by mouth daily.     predniSONE (DELTASONE) 20 MG tablet 3 tabs poqday 1-2, 2 tabs poqday 3-4, 1 tab poqday 5-6 12 tablet 0   No current facility-administered medications on file prior to visit.   No Known Allergies Social History   Socioeconomic History   Marital status: Married    Spouse name: Not on file   Number of children: Not on file   Years of education: Not on file   Highest education level: Not on file  Occupational History   Not on file  Tobacco Use   Smoking status: Former    Packs/day: 0.50    Years: 15.00    Total pack years: 7.50    Types: Cigarettes    Quit date: 04/27/1989    Years since quitting: 33.4   Smokeless tobacco: Never  Substance and Sexual Activity   Alcohol use: Yes    Alcohol/week: 1.0 standard drink of alcohol    Types: 1 Glasses of wine per week    Comment: moderate - wine every 3 rd day   Drug use: No   Sexual activity: Yes    Birth control/protection: None  Other Topics Concern   Not  on file  Social History Narrative   Not on file   Social Determinants of Health   Financial Resource Strain: Not on file  Food Insecurity: Not on file  Transportation Needs: Not on file  Physical Activity: Not on file  Stress: Not on file  Social Connections: Not on file  Intimate Partner Violence: Not on file      Review of Systems  All other systems reviewed and are negative.      Objective:   Physical Exam Vitals reviewed.  Constitutional:      General: He is not in acute distress.    Appearance: Normal appearance. He is normal weight. He is not ill-appearing, toxic-appearing or diaphoretic.  HENT:     Head: Normocephalic and atraumatic.     Right Ear: Tympanic membrane, ear canal and external ear normal. There is no impacted cerumen.     Left Ear: Tympanic membrane, ear canal and external ear normal. There is no impacted cerumen.     Nose: Nose normal. No congestion or rhinorrhea.     Mouth/Throat:     Mouth: Mucous membranes are moist.     Pharynx: Oropharynx is clear. No oropharyngeal exudate or posterior oropharyngeal erythema.  Eyes:     General: No scleral icterus.       Right eye: No discharge.        Left eye: No discharge.     Extraocular Movements: Extraocular movements intact.     Conjunctiva/sclera: Conjunctivae normal.     Pupils: Pupils are equal, round, and reactive to light.  Neck:     Vascular: No carotid bruit.  Cardiovascular:     Rate and Rhythm: Normal rate and regular rhythm.     Pulses: Normal  pulses.     Heart sounds: Normal heart sounds. No murmur heard.    No friction rub. No gallop.  Pulmonary:     Effort: Pulmonary effort is normal. No respiratory distress.     Breath sounds: Normal breath sounds. No stridor. No wheezing, rhonchi or rales.  Chest:     Chest wall: No tenderness.  Abdominal:     General: Bowel sounds are normal. There is no distension.     Palpations: Abdomen is soft. There is no mass.     Tenderness: There is no  abdominal tenderness. There is no right CVA tenderness, left CVA tenderness, guarding or rebound.     Hernia: No hernia is present.  Musculoskeletal:     Cervical back: Normal range of motion and neck supple. No rigidity.     Right lower leg: No edema.     Left lower leg: No edema.  Lymphadenopathy:     Cervical: No cervical adenopathy.  Skin:    General: Skin is warm.     Coloration: Skin is not jaundiced or pale.     Findings: No bruising, erythema, lesion or rash.  Neurological:     General: No focal deficit present.     Mental Status: He is alert and oriented to person, place, and time.     Cranial Nerves: No cranial nerve deficit.     Sensory: No sensory deficit.     Motor: No weakness.     Coordination: Coordination normal.     Gait: Gait normal.     Deep Tendon Reflexes: Reflexes normal.  Psychiatric:        Mood and Affect: Mood normal.        Behavior: Behavior normal.        Thought Content: Thought content normal.        Judgment: Judgment normal.           Assessment & Plan:  General medical exam  Personal history of prostate cancer  Stage 3a chronic kidney disease ([place])  Pure hypercholesterolemia Patient's cholesterol is elevated however he had a coronary artery calcium score last year 0.  Therefore we have elected against treating with a statin.  He would like to try Lovaza 2000 mg daily.  Immunizations are up-to-date.  PSA is undetectable.  Colonoscopy is due next year.  I refilled his lorazepam which he uses sparingly

## 2022-10-22 ENCOUNTER — Encounter: Payer: Self-pay | Admitting: Family Medicine

## 2022-10-22 ENCOUNTER — Ambulatory Visit: Payer: Medicare PPO | Admitting: Family Medicine

## 2022-10-22 VITALS — BP 126/76 | HR 60 | Ht 75.0 in | Wt 203.0 lb

## 2022-10-22 DIAGNOSIS — M545 Low back pain, unspecified: Secondary | ICD-10-CM | POA: Diagnosis not present

## 2022-10-22 DIAGNOSIS — G8929 Other chronic pain: Secondary | ICD-10-CM | POA: Diagnosis not present

## 2022-10-22 NOTE — Progress Notes (Signed)
Subjective:    Patient ID: Dwayne Young, male    DOB: [birth year], 72 y.o.   MRN: [id number]  Back Pain  Patient reports a 5-week history of pain in his right flank.  In the past it has been higher however this time it is lower roughly around the level of L4-L5.  He has a palpable muscle spasm in that area.  He denies any saddle anesthesias or bowel or bladder incontinence.  He denies any leg weakness.  He denies any sciatica.  Pain has been more like muscle spasms.  However it improved through the weekend and is virtually subsided today.  He denies any dysuria or hematuria Past Medical History:  Diagnosis Date   Arthritis    arthritis-generalized-knees, elbows   Elevated PSA    Hypercholesteremia    Hyperlipidemia    Prostate cancer ([place])    dx. prostate cancer after bx. 2'15-now surgery planned   Retinal detachment    Seasonal allergies    Past Surgical History:  Procedure Laterality Date   COLONOSCOPY N/A 04/27/2013   Procedure: COLONOSCOPY;  Surgeon: Jamesetta So, MD;  [facility];  Service: Gastroenterology;  Laterality: N/A;   GANGLION CYST EXCISION Right    KNEE ARTHROSCOPY Bilateral    open procedure   LYMPHADENECTOMY Bilateral 03/03/2014   Procedure: LYMPHADENECTOMY;  Surgeon: Raynelle Bring, MD;  [facility];  Service: Urology;  Laterality: Bilateral;   NASAL SEPTOPLASTY W/ TURBINOPLASTY     PROSTATE SURGERY N/A    [facility] 07/08/2020   ROBOT ASSISTED LAPAROSCOPIC RADICAL PROSTATECTOMY N/A 03/03/2014   Procedure: ROBOTIC ASSISTED LAPAROSCOPIC RADICAL PROSTATECTOMY LEVEL 2;  Surgeon: Raynelle Bring, MD;  [facility];  Service: Urology;  Laterality: N/A;   TONSILLECTOMY     child   TOOTH EXTRACTION     preparing for tooth implant   VASECTOMY N/A    [facility] 07/08/2020   Current Outpatient Medications on File Prior to Visit  Medication Sig Dispense Refill   diclofenac Sodium (VOLTAREN) 1 % GEL Apply 1 application topically daily as needed (pain).      LORazepam (ATIVAN) 1 MG tablet TAKE (1) TABLET BY MOUTH TWICE DAILY AS NEEDED FOR ANXIETY. 20 tablet 1   omega-3 acid ethyl esters (LOVAZA) 1 g capsule Take 2 capsules (2 g total) by mouth daily. 180 capsule 3   Omega-3 Fatty Acids (FISH OIL) 500 MG CAPS Take 500 mg by mouth daily.     No current facility-administered medications on file prior to visit.   No Known Allergies    Review of Systems  Musculoskeletal:  Positive for back pain.       Objective:   Physical Exam Constitutional:      Appearance: Normal appearance. He is normal weight.  Cardiovascular:     Rate and Rhythm: Normal rate and regular rhythm.     Heart sounds: Normal heart sounds. No murmur heard.    No friction rub. No gallop.  Pulmonary:     Effort: Pulmonary effort is normal. No respiratory distress.     Breath sounds: Normal breath sounds. No stridor. No wheezing, rhonchi or rales.  Musculoskeletal:     Lumbar back: Spasms and tenderness present. No bony tenderness. Normal range of motion. Negative right straight leg raise test and negative left straight leg raise test.       Back:  Neurological:     Mental Status: He is alert.           Assessment &  Plan:  Chronic right-sided low back pain without sciatica - Plan: DG Lumbar Spine Complete Begin by obtaining an x-ray of the lumbar spine given his history of prostate cancer.  However I believe this is likely muscle spasms.  Offered the patient muscle relaxers but he politely declined these.  He cannot take NSAIDs.  If the pain is recurrent I would recommend physical therapy if he does not want to try muscle relaxers.  Patient is comfortable with this plan

## 2022-11-22 ENCOUNTER — Encounter: Payer: Self-pay | Admitting: Family Medicine

## 2022-11-22 ENCOUNTER — Ambulatory Visit: Payer: Medicare PPO | Admitting: Family Medicine

## 2022-11-22 VITALS — BP 122/80 | HR 66 | Temp 97.9°F | Ht 75.0 in | Wt 203.0 lb

## 2022-11-22 DIAGNOSIS — M7552 Bursitis of left shoulder: Secondary | ICD-10-CM

## 2022-11-22 NOTE — Progress Notes (Signed)
Subjective:    Patient ID: Dwayne Young, male    DOB: [birth year], 72 y.o.   MRN: [id number]  Patient presents today complaining of several months of pain in his left shoulder.  He has pain with abduction greater than 90 degrees.  It aches and throbs in his shoulder at night.  Even passive abduction hurts at approximately 100 degrees.  There is no crepitus in the shoulder.  He has good strength however empty can testing elicits significant pain.  He has no pain with Hawkins maneuver.  He has no pain with O'Brien's maneuver Past Medical History:  Diagnosis Date  . Arthritis    arthritis-generalized-knees, elbows  . Elevated PSA   . Hypercholesteremia   . Hyperlipidemia   . Prostate cancer ([place])    dx. prostate cancer after bx. 2'15-now surgery planned  . Retinal detachment   . Seasonal allergies    Past Surgical History:  Procedure Laterality Date  . COLONOSCOPY N/A 04/27/2013   Procedure: COLONOSCOPY;  Surgeon: Jamesetta So, MD;  [facility];  Service: Gastroenterology;  Laterality: N/A;  . GANGLION CYST EXCISION Right   . KNEE ARTHROSCOPY Bilateral    open procedure  . LYMPHADENECTOMY Bilateral 03/03/2014   Procedure: LYMPHADENECTOMY;  Surgeon: Raynelle Bring, MD;  [facility];  Service: Urology;  Laterality: Bilateral;  . NASAL SEPTOPLASTY W/ TURBINOPLASTY    . PROSTATE SURGERY N/A    [facility] 07/08/2020  . ROBOT ASSISTED LAPAROSCOPIC RADICAL PROSTATECTOMY N/A 03/03/2014   Procedure: ROBOTIC ASSISTED LAPAROSCOPIC RADICAL PROSTATECTOMY LEVEL 2;  Surgeon: Raynelle Bring, MD;  [facility];  Service: Urology;  Laterality: N/A;  . TONSILLECTOMY     child  . TOOTH EXTRACTION     preparing for tooth implant  . VASECTOMY N/A    [facility] 07/08/2020   Current Outpatient Medications on File Prior to Visit  Medication Sig Dispense Refill  . diclofenac Sodium (VOLTAREN) 1 % GEL Apply 1 application topically daily as needed (pain).    . LORazepam (ATIVAN) 1 MG  tablet TAKE (1) TABLET BY MOUTH TWICE DAILY AS NEEDED FOR ANXIETY. 20 tablet 1  . omega-3 acid ethyl esters (LOVAZA) 1 g capsule Take 2 capsules (2 g total) by mouth daily. 180 capsule 3   No current facility-administered medications on file prior to visit.   No Known Allergies    Review of Systems  Musculoskeletal:  Positive for back pain.       Objective:   Physical Exam Constitutional:      Appearance: Normal appearance. He is normal weight.  Cardiovascular:     Rate and Rhythm: Normal rate and regular rhythm.     Heart sounds: Normal heart sounds. No murmur heard.    No friction rub. No gallop.  Pulmonary:     Effort: Pulmonary effort is normal. No respiratory distress.     Breath sounds: Normal breath sounds. No stridor. No wheezing, rhonchi or rales.  Musculoskeletal:     Left shoulder: Tenderness present. No bony tenderness or crepitus. Decreased range of motion. Normal strength.  Neurological:     Mental Status: He is alert.          Assessment & Plan:  Subacromial bursitis of left shoulder joint Patient has subacromial bursitis in the left shoulder as well as tendinitis and supraspinatus.  Using sterile technique, I injected the left subacromial space with 2 cc of lidocaine, 2 cc of Marcaine, and 2 cc of 40 mg/mL Kenalog.  The patient tolerated  the procedure well without complication.  If not improving, the neck step will be to obtain an MRI of the left shoulder to evaluate for partial tear of the supraspinatus

## 2022-12-04 IMAGING — CR DG THORACIC SPINE 3V
3 series · 3 of 3 positions shown · non-contrast
Comparison: Two views chest 11/09/2020.

CLINICAL DATA: Mid back pain.  History of prostate cancer.

EXAM:
THORACIC SPINE - 3 VIEWS

[w thoracic spine ap]
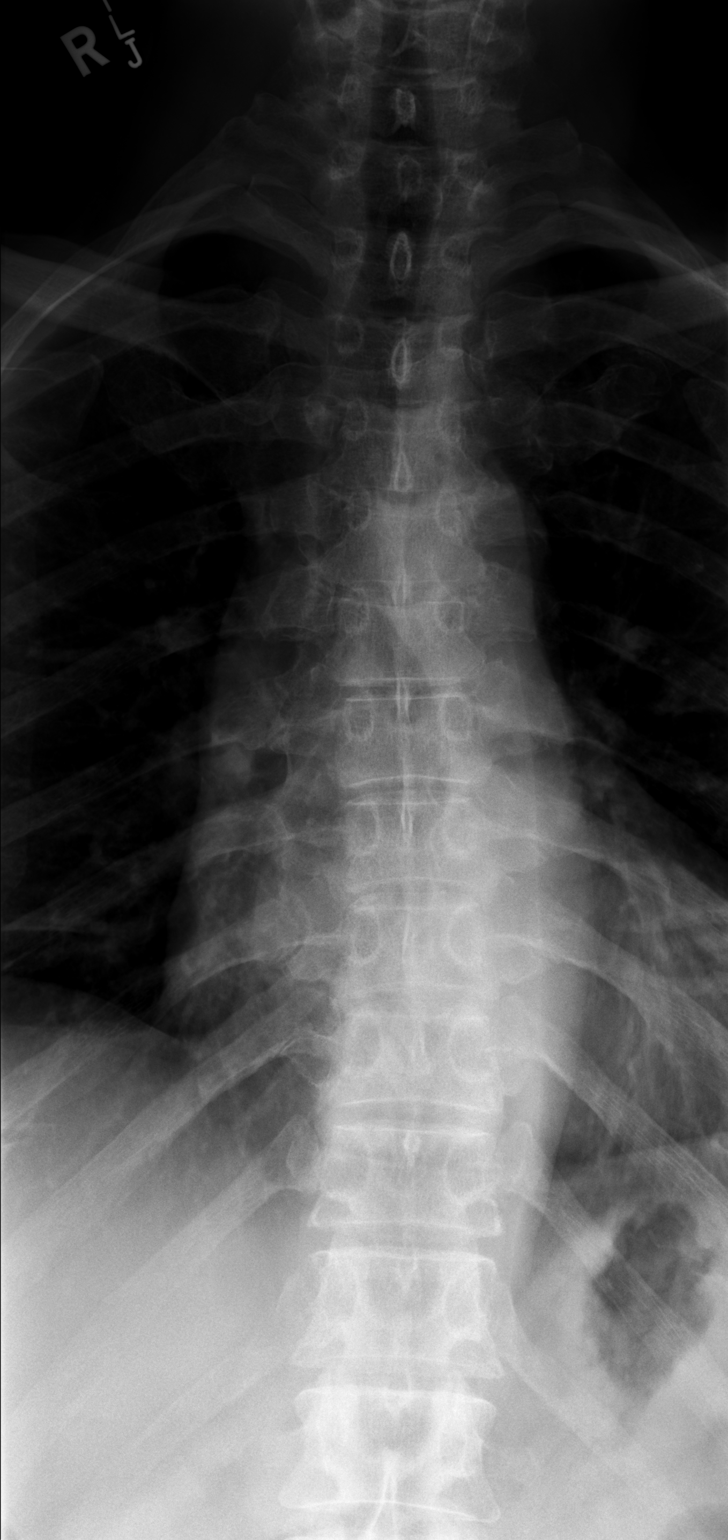

[w thoracic spine lat]
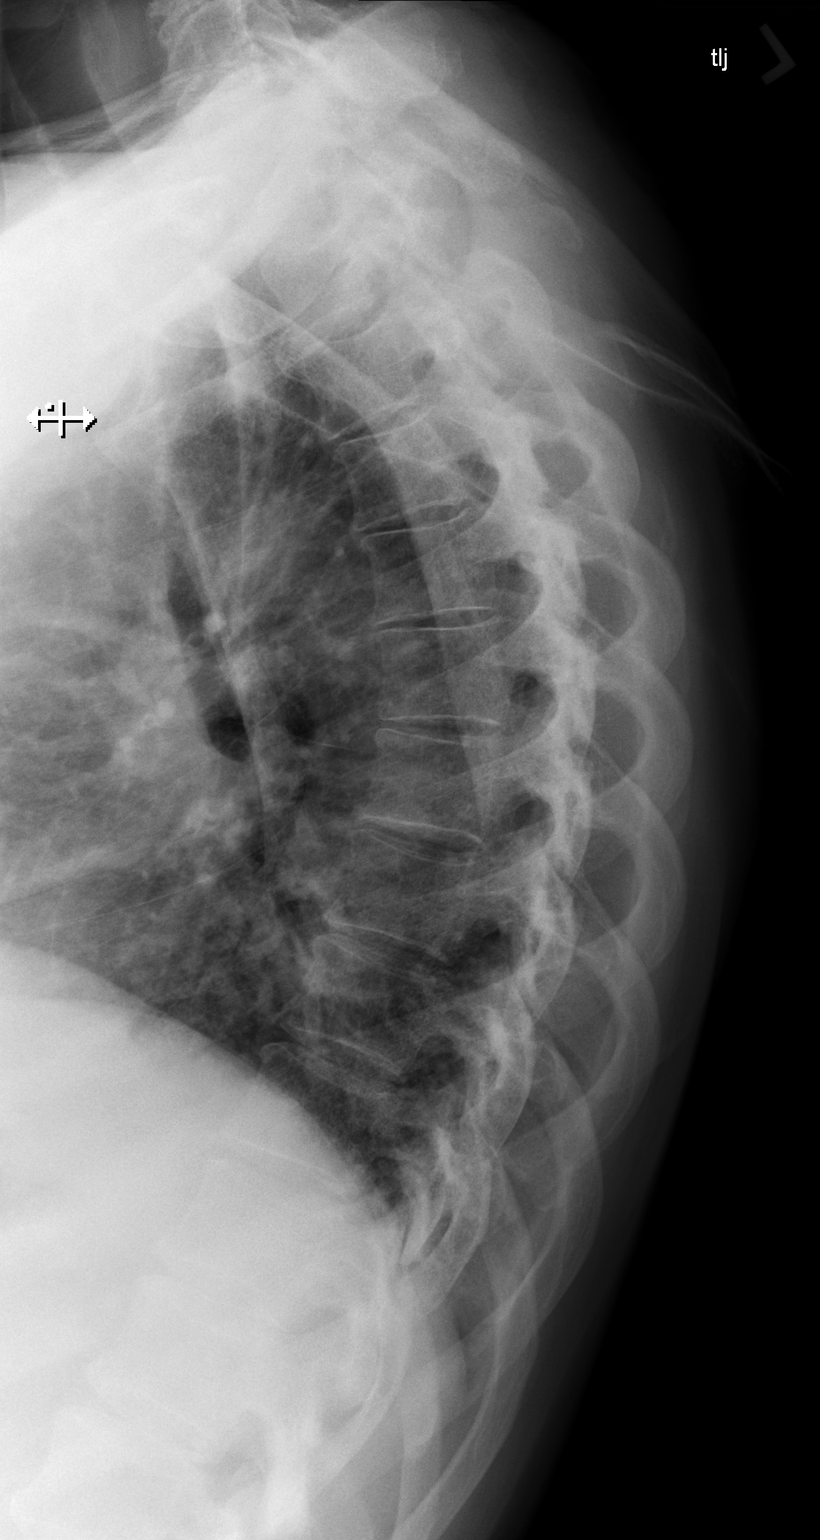

[w thoracic swimmers]
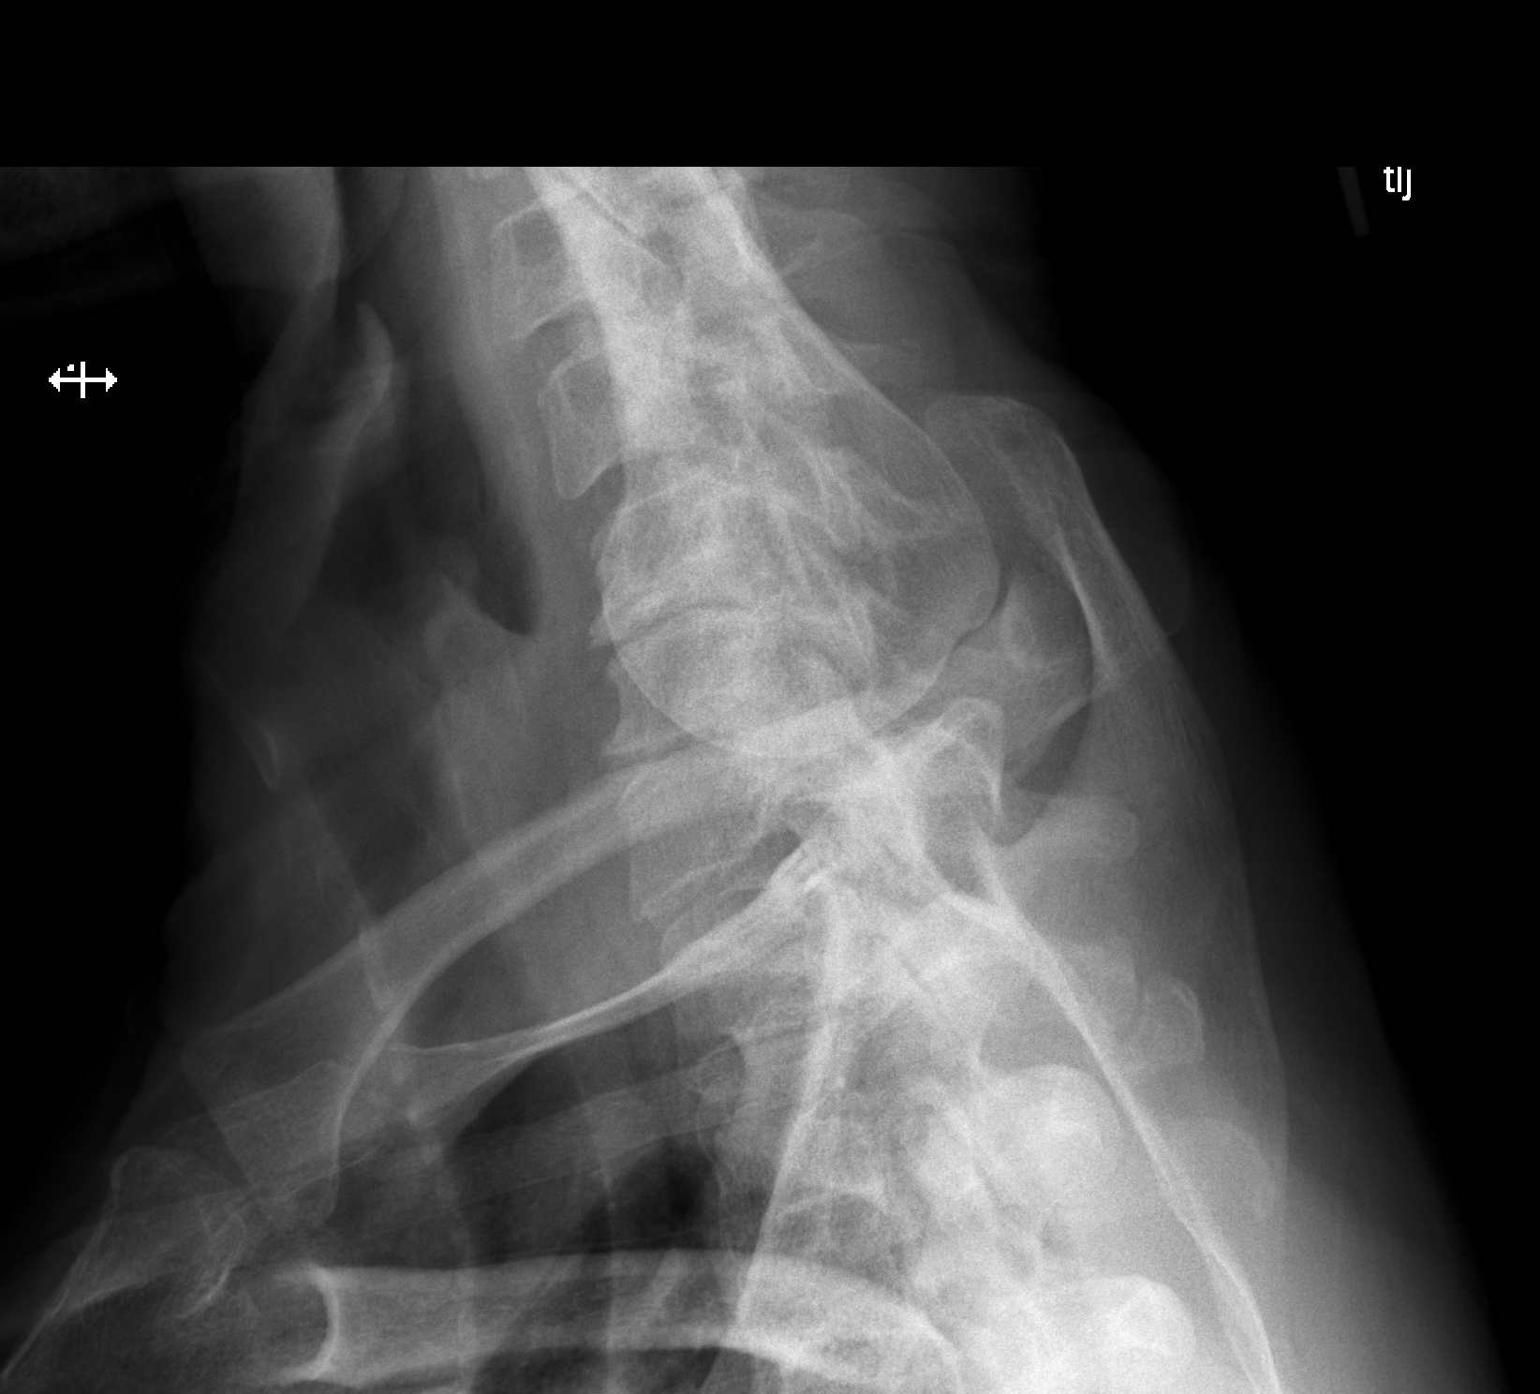

[3 of 3 positions shown; findings below may reference images not displayed]

FINDINGS: There is no evidence of thoracic spine fracture. Alignment is
normal. No other significant bone abnormalities are identified.
IMPRESSION: Negative.

## 2023-02-18 ENCOUNTER — Ambulatory Visit: Payer: Medicare PPO | Admitting: Family Medicine

## 2023-02-18 ENCOUNTER — Encounter: Payer: Self-pay | Admitting: Family Medicine

## 2023-02-18 VITALS — BP 118/64 | HR 75 | Temp 98.4°F | Ht 75.0 in | Wt 203.4 lb

## 2023-02-18 DIAGNOSIS — C61 Malignant neoplasm of prostate: Secondary | ICD-10-CM

## 2023-02-18 DIAGNOSIS — E7800 Pure hypercholesterolemia, unspecified: Secondary | ICD-10-CM | POA: Diagnosis not present

## 2023-02-18 DIAGNOSIS — N1831 Chronic kidney disease, stage 3a: Secondary | ICD-10-CM

## 2023-02-18 DIAGNOSIS — M75102 Unspecified rotator cuff tear or rupture of left shoulder, not specified as traumatic: Secondary | ICD-10-CM | POA: Diagnosis not present

## 2023-02-18 NOTE — Progress Notes (Signed)
Subjective:    Patient ID: Dwayne Young, male    DOB: 23-Sep-1951, 72 y.o.   MRN: [id number] 2/24 Patient presents today complaining of several months of pain in his left shoulder.  He has pain with abduction greater than 90 degrees.  It aches and throbs in his shoulder at night.  Even passive abduction hurts at approximately 100 degrees.  There is no crepitus in the shoulder.  He has good strength however empty can testing elicits significant pain.  He has no pain with Hawkins maneuver.  He has no pain with O'Brien's maneuver.  At that time, my plan was: Patient has subacromial bursitis in the left shoulder as well as tendinitis and supraspinatus.  Using sterile technique, I injected the left subacromial space with 2 cc of lidocaine, 2 cc of Marcaine, and 2 cc of 40 mg/mL Kenalog.  The patient tolerated the procedure well without complication.  If not improving, the neck step will be to obtain an MRI of the left shoulder to evaluate for partial tear of the supraspinatus  02/18/23 Patient felt a pop in his left shoulder while playing pickle ball several months ago.  He had to immediately stop due to the pain.  I saw the patient originally in February and performed a cortisone injection at that time.  The patient saw no benefit from a cortisone injection.  He states the pain may have gotten better for a week but then the pain immediately returned.  It is now throbbing pain with subjective weakness keeping him awake at night and preventing him from performing any overhead activity.  This leads me to believe that there may be a tear in the supraspinatus tendon.  He is unable to take NSAIDs due to chronic kidney disease.  He also has to limit his Tylenol use due to cirrhosis.  Therefore he is essentially trying to deal with the pain topical diclofenac which is not working.  He is here today for follow-up Past Medical History:  Diagnosis Date   Arthritis    arthritis-generalized-knees, elbows   Elevated PSA     Hypercholesteremia    Hyperlipidemia    Prostate cancer ([facility])    dx. prostate cancer after bx. 2'15-now surgery planned   Retinal detachment    Seasonal allergies    Past Surgical History:  Procedure Laterality Date   COLONOSCOPY N/A 04/27/2013   Procedure: COLONOSCOPY;  Surgeon: Dalia Heading, MD;  [facility];  Service: Gastroenterology;  Laterality: N/A;   GANGLION CYST EXCISION Right    KNEE ARTHROSCOPY Bilateral    open procedure   LYMPHADENECTOMY Bilateral 03/03/2014   Procedure: LYMPHADENECTOMY;  Surgeon: Heloise Purpura, MD;  [facility];  Service: Urology;  Laterality: Bilateral;   NASAL SEPTOPLASTY W/ TURBINOPLASTY     PROSTATE SURGERY N/A    [facility] 07/08/2020   ROBOT ASSISTED LAPAROSCOPIC RADICAL PROSTATECTOMY N/A 03/03/2014   Procedure: ROBOTIC ASSISTED LAPAROSCOPIC RADICAL PROSTATECTOMY LEVEL 2;  Surgeon: Heloise Purpura, MD;  [facility];  Service: Urology;  Laterality: N/A;   TONSILLECTOMY     child   TOOTH EXTRACTION     preparing for tooth implant   VASECTOMY N/A    [facility] 07/08/2020   Current Outpatient Medications on File Prior to Visit  Medication Sig Dispense Refill   diclofenac Sodium (VOLTAREN) 1 % GEL Apply 1 application topically daily as needed (pain).     LORazepam (ATIVAN) 1 MG tablet TAKE (1) TABLET BY MOUTH TWICE DAILY AS NEEDED FOR  ANXIETY. 20 tablet 1   omega-3 acid ethyl esters (LOVAZA) 1 g capsule Take 2 capsules (2 g total) by mouth daily. 180 capsule 3   No current facility-administered medications on file prior to visit.   No Known Allergies    Review of Systems  Musculoskeletal:  Positive for back pain.       Objective:   Physical Exam Constitutional:      Appearance: Normal appearance. He is normal weight.  Cardiovascular:     Rate and Rhythm: Normal rate and regular rhythm.     Heart sounds: Normal heart sounds. No murmur heard.    No friction rub. No gallop.  Pulmonary:     Effort: Pulmonary effort  is normal. No respiratory distress.     Breath sounds: Normal breath sounds. No stridor. No wheezing, rhonchi or rales.  Musculoskeletal:     Left shoulder: Tenderness present. No bony tenderness or crepitus. Decreased range of motion. Decreased strength.  Neurological:     Mental Status: He is alert.           Assessment & Plan:  Stage 3a chronic kidney disease ([facility]) - Plan: CBC with Differential/Platelet, COMPLETE METABOLIC PANEL WITH GFR, Lipid panel  Pure hypercholesterolemia - Plan: CBC with Differential/Platelet, COMPLETE METABOLIC PANEL WITH GFR, Lipid panel  Tear of left supraspinatus tendon - Plan: MR Shoulder Left Wo Contrast, CANCELED: MR Shoulder Right Wo Contrast I am concerned the patient tore his left supraspinatus tendon at least partially.  He is now starting to demonstrate decreased strength and constant pain.  The pain has been present now for several months greater than 3.  He has tried and failed conservative therapy including cortisone injection.  He is unable to tolerate NSAIDs and the pain seems to be worsening and now he is developing weakness.  Proceed with an MRI of the left shoulder to evaluate further.  Likely will need an orthopedic consultation if a tear is confirmed.  We discussed the possibility of physical therapy however the patient states the pain is becoming severe enough that he will proceed with surgery if there is a partial tear.  Check fasting lab work while the patient is here today

## 2023-02-18 NOTE — Addendum Note (Signed)
Addended by: Lynnea Ferrier T on: 02/18/2023 10:02 AM   Modules accepted: Orders

## 2023-02-19 LAB — CBC WITH DIFFERENTIAL/PLATELET
Absolute Monocytes: 485 cells/uL (ref 200–950)
Basophils Absolute: 31 cells/uL (ref 0–200)
Basophils Relative: 0.4 %
Eosinophils Absolute: 139 cells/uL (ref 15–500)
Eosinophils Relative: 1.8 %
HCT: 46.2 % (ref 38.5–50.0)
Hemoglobin: 15.3 g/dL (ref 13.2–17.1)
Lymphs Abs: 3581 cells/uL (ref 850–3900)
MCH: 30.7 pg (ref 27.0–33.0)
MCHC: 33.1 g/dL (ref 32.0–36.0)
MCV: 92.8 fL (ref 80.0–100.0)
MPV: 10 fL (ref 7.5–12.5)
Monocytes Relative: 6.3 %
Neutro Abs: 3465 cells/uL (ref 1500–7800)
Neutrophils Relative %: 45 %
Platelets: 231 10*3/uL (ref 140–400)
RBC: 4.98 10*6/uL (ref 4.20–5.80)
RDW: 12.8 % (ref 11.0–15.0)
Total Lymphocyte: 46.5 %
WBC: 7.7 10*3/uL (ref 3.8–10.8)

## 2023-02-19 LAB — COMPLETE METABOLIC PANEL WITH GFR
AG Ratio: 1.6 (calc) (ref 1.0–2.5)
ALT: 11 U/L (ref 9–46)
AST: 18 U/L (ref 10–35)
Albumin: 4.1 g/dL (ref 3.6–5.1)
Alkaline phosphatase (APISO): 70 U/L (ref 35–144)
BUN: 18 mg/dL (ref 7–25)
CO2: 26 mmol/L (ref 20–32)
Calcium: 9.2 mg/dL (ref 8.6–10.3)
Chloride: 104 mmol/L (ref 98–110)
Creat: 1.25 mg/dL (ref 0.70–1.28)
Globulin: 2.5 g/dL (calc) (ref 1.9–3.7)
Glucose, Bld: 89 mg/dL (ref 65–99)
Potassium: 4.5 mmol/L (ref 3.5–5.3)
Sodium: 140 mmol/L (ref 135–146)
Total Bilirubin: 0.7 mg/dL (ref 0.2–1.2)
Total Protein: 6.6 g/dL (ref 6.1–8.1)
eGFR: 61 mL/min/{1.73_m2} (ref >=60)

## 2023-02-19 LAB — LIPID PANEL
Cholesterol: 232 mg/dL — ABNORMAL HIGH (ref <200)
HDL: 52 mg/dL (ref >=40)
LDL Cholesterol (Calc): 154 mg/dL (calc) — ABNORMAL HIGH
Non-HDL Cholesterol (Calc): 180 mg/dL (calc) — ABNORMAL HIGH (ref <130)
Total CHOL/HDL Ratio: 4.5 (calc) (ref <5.0)
Triglycerides: 137 mg/dL (ref <150)

## 2023-02-19 LAB — PSA: PSA: <0.04 ng/mL (ref <=4.00)

## 2023-02-20 ENCOUNTER — Other Ambulatory Visit: Payer: Self-pay

## 2023-02-20 MED ORDER — ROSUVASTATIN CALCIUM 10 MG PO TABS: 10.0000 mg | ORAL_TABLET | Freq: Every day | ORAL | 3 refills | Status: DC

## 2023-02-21 MED ORDER — LORAZEPAM 1 MG PO TABS: ORAL_TABLET | ORAL | 1 refills | Status: DC

## 2023-02-25 ENCOUNTER — Encounter: Payer: Self-pay | Admitting: Family Medicine

## 2023-03-20 ENCOUNTER — Ambulatory Visit
Admission: RE | Admit: 2023-03-20 | Discharge: 2023-03-20 | Disposition: A | Discharge status: Home / Self Care | Payer: Medicare PPO | Source: Ambulatory Visit | Attending: Family Medicine | Admitting: Family Medicine

## 2023-03-20 DIAGNOSIS — M19012 Primary osteoarthritis, left shoulder: Secondary | ICD-10-CM | POA: Diagnosis not present

## 2023-03-20 DIAGNOSIS — M25512 Pain in left shoulder: Secondary | ICD-10-CM | POA: Diagnosis not present

## 2023-03-20 DIAGNOSIS — M75102 Unspecified rotator cuff tear or rupture of left shoulder, not specified as traumatic: Secondary | ICD-10-CM

## 2023-03-20 DIAGNOSIS — S46012A Strain of muscle(s) and tendon(s) of the rotator cuff of left shoulder, initial encounter: Secondary | ICD-10-CM | POA: Diagnosis not present

## 2023-03-27 ENCOUNTER — Other Ambulatory Visit: Payer: Self-pay | Admitting: Family Medicine

## 2023-03-27 DIAGNOSIS — M75120 Complete rotator cuff tear or rupture of unspecified shoulder, not specified as traumatic: Secondary | ICD-10-CM

## 2023-03-31 DIAGNOSIS — M25512 Pain in left shoulder: Secondary | ICD-10-CM | POA: Diagnosis not present

## 2023-04-03 ENCOUNTER — Encounter: Payer: Self-pay | Admitting: Family Medicine

## 2023-04-25 DIAGNOSIS — G8918 Other acute postprocedural pain: Secondary | ICD-10-CM | POA: Diagnosis not present

## 2023-04-25 DIAGNOSIS — Z96612 Presence of left artificial shoulder joint: Secondary | ICD-10-CM | POA: Diagnosis not present

## 2023-04-25 DIAGNOSIS — I890 Lymphedema, not elsewhere classified: Secondary | ICD-10-CM | POA: Diagnosis not present

## 2023-04-25 DIAGNOSIS — Z4789 Encounter for other orthopedic aftercare: Secondary | ICD-10-CM | POA: Insufficient documentation

## 2023-04-25 DIAGNOSIS — Z471 Aftercare following joint replacement surgery: Secondary | ICD-10-CM | POA: Diagnosis not present

## 2023-04-25 DIAGNOSIS — M25512 Pain in left shoulder: Secondary | ICD-10-CM | POA: Diagnosis not present

## 2023-04-25 DIAGNOSIS — M75122 Complete rotator cuff tear or rupture of left shoulder, not specified as traumatic: Secondary | ICD-10-CM | POA: Diagnosis not present

## 2023-04-25 DIAGNOSIS — M25812 Other specified joint disorders, left shoulder: Secondary | ICD-10-CM | POA: Diagnosis not present

## 2023-04-25 DIAGNOSIS — E7800 Pure hypercholesterolemia, unspecified: Secondary | ICD-10-CM | POA: Diagnosis not present

## 2023-04-25 DIAGNOSIS — Z87891 Personal history of nicotine dependence: Secondary | ICD-10-CM | POA: Diagnosis not present

## 2023-04-25 DIAGNOSIS — Z7409 Other reduced mobility: Secondary | ICD-10-CM | POA: Diagnosis not present

## 2023-04-25 DIAGNOSIS — Z7982 Long term (current) use of aspirin: Secondary | ICD-10-CM | POA: Diagnosis not present

## 2023-05-12 DIAGNOSIS — Z96612 Presence of left artificial shoulder joint: Secondary | ICD-10-CM | POA: Diagnosis not present

## 2023-05-12 DIAGNOSIS — Z4731 Aftercare following explantation of shoulder joint prosthesis: Secondary | ICD-10-CM | POA: Diagnosis not present

## 2023-05-28 DIAGNOSIS — M25512 Pain in left shoulder: Secondary | ICD-10-CM | POA: Diagnosis not present

## 2023-05-28 DIAGNOSIS — M25519 Pain in unspecified shoulder: Secondary | ICD-10-CM | POA: Insufficient documentation

## 2023-06-03 DIAGNOSIS — M25512 Pain in left shoulder: Secondary | ICD-10-CM | POA: Diagnosis not present

## 2023-06-05 DIAGNOSIS — Z96612 Presence of left artificial shoulder joint: Secondary | ICD-10-CM | POA: Insufficient documentation

## 2023-06-09 DIAGNOSIS — Z96612 Presence of left artificial shoulder joint: Secondary | ICD-10-CM | POA: Diagnosis not present

## 2023-06-09 DIAGNOSIS — Z471 Aftercare following joint replacement surgery: Secondary | ICD-10-CM | POA: Diagnosis not present

## 2023-06-17 DIAGNOSIS — M25512 Pain in left shoulder: Secondary | ICD-10-CM | POA: Diagnosis not present

## 2023-06-24 DIAGNOSIS — M25512 Pain in left shoulder: Secondary | ICD-10-CM | POA: Diagnosis not present

## 2023-07-08 DIAGNOSIS — M25512 Pain in left shoulder: Secondary | ICD-10-CM | POA: Diagnosis not present

## 2023-07-21 DIAGNOSIS — M47812 Spondylosis without myelopathy or radiculopathy, cervical region: Secondary | ICD-10-CM | POA: Diagnosis not present

## 2023-07-21 DIAGNOSIS — Z96612 Presence of left artificial shoulder joint: Secondary | ICD-10-CM | POA: Diagnosis not present

## 2023-07-21 DIAGNOSIS — M542 Cervicalgia: Secondary | ICD-10-CM | POA: Diagnosis not present

## 2023-07-21 DIAGNOSIS — Z471 Aftercare following joint replacement surgery: Secondary | ICD-10-CM | POA: Diagnosis not present

## 2023-08-28 ENCOUNTER — Other Ambulatory Visit: Payer: Medicare PPO

## 2023-08-28 DIAGNOSIS — N1831 Chronic kidney disease, stage 3a: Secondary | ICD-10-CM

## 2023-08-28 DIAGNOSIS — Z0000 Encounter for general adult medical examination without abnormal findings: Secondary | ICD-10-CM

## 2023-08-28 DIAGNOSIS — E785 Hyperlipidemia, unspecified: Secondary | ICD-10-CM | POA: Diagnosis not present

## 2023-08-28 DIAGNOSIS — C61 Malignant neoplasm of prostate: Secondary | ICD-10-CM

## 2023-08-28 DIAGNOSIS — E7800 Pure hypercholesterolemia, unspecified: Secondary | ICD-10-CM

## 2023-08-29 LAB — CBC WITH DIFFERENTIAL/PLATELET
Absolute Lymphocytes: 3105 {cells}/uL (ref 850–3900)
Absolute Monocytes: 483 {cells}/uL (ref 200–950)
Basophils Absolute: 28 {cells}/uL (ref 0–200)
Basophils Relative: 0.4 %
Eosinophils Absolute: 110 {cells}/uL (ref 15–500)
Eosinophils Relative: 1.6 %
HCT: 47.2 % (ref 38.5–50.0)
Hemoglobin: 15.5 g/dL (ref 13.2–17.1)
MCH: 30.3 pg (ref 27.0–33.0)
MCHC: 32.8 g/dL (ref 32.0–36.0)
MCV: 92.4 fL (ref 80.0–100.0)
MPV: 10.2 fL (ref 7.5–12.5)
Monocytes Relative: 7 %
Neutro Abs: 3174 {cells}/uL (ref 1500–7800)
Neutrophils Relative %: 46 %
Platelets: 208 10*3/uL (ref 140–400)
RBC: 5.11 10*6/uL (ref 4.20–5.80)
RDW: 13 % (ref 11.0–15.0)
Total Lymphocyte: 45 %
WBC: 6.9 10*3/uL (ref 3.8–10.8)

## 2023-08-29 LAB — COMPLETE METABOLIC PANEL WITH GFR
AG Ratio: 1.7 (calc) (ref 1.0–2.5)
ALT: 15 U/L (ref 9–46)
AST: 21 U/L (ref 10–35)
Albumin: 4.1 g/dL (ref 3.6–5.1)
Alkaline phosphatase (APISO): 75 U/L (ref 35–144)
BUN: 16 mg/dL (ref 7–25)
CO2: 29 mmol/L (ref 20–32)
Calcium: 9.3 mg/dL (ref 8.6–10.3)
Chloride: 104 mmol/L (ref 98–110)
Creat: 1.08 mg/dL (ref 0.70–1.28)
Globulin: 2.4 g/dL (ref 1.9–3.7)
Glucose, Bld: 96 mg/dL (ref 65–99)
Potassium: 4.7 mmol/L (ref 3.5–5.3)
Sodium: 139 mmol/L (ref 135–146)
Total Bilirubin: 0.5 mg/dL (ref 0.2–1.2)
Total Protein: 6.5 g/dL (ref 6.1–8.1)
eGFR: 72 mL/min/{1.73_m2} (ref >=60)

## 2023-08-29 LAB — LIPID PANEL
Cholesterol: 133 mg/dL (ref <200)
HDL: 55 mg/dL (ref >=40)
LDL Cholesterol (Calc): 64 mg/dL
Non-HDL Cholesterol (Calc): 78 mg/dL (ref <130)
Total CHOL/HDL Ratio: 2.4 (calc) (ref <5.0)
Triglycerides: 65 mg/dL (ref <150)

## 2023-08-29 LAB — PSA: PSA: <0.04 ng/mL (ref <=4.00)

## 2023-09-01 DIAGNOSIS — M25512 Pain in left shoulder: Secondary | ICD-10-CM | POA: Diagnosis not present

## 2023-09-02 ENCOUNTER — Ambulatory Visit: Payer: Medicare PPO | Admitting: Family Medicine

## 2023-09-02 ENCOUNTER — Encounter: Payer: Self-pay | Admitting: Family Medicine

## 2023-09-02 VITALS — BP 132/82 | HR 68 | Temp 97.6°F | Ht 75.0 in | Wt 207.4 lb

## 2023-09-02 DIAGNOSIS — E7800 Pure hypercholesterolemia, unspecified: Secondary | ICD-10-CM

## 2023-09-02 DIAGNOSIS — N1831 Chronic kidney disease, stage 3a: Secondary | ICD-10-CM | POA: Diagnosis not present

## 2023-09-02 DIAGNOSIS — Z1211 Encounter for screening for malignant neoplasm of colon: Secondary | ICD-10-CM

## 2023-09-02 MED ORDER — LORAZEPAM 1 MG PO TABS
ORAL_TABLET | ORAL | 1 refills | Status: DC
Start: 2023-09-02 — End: 2024-02-26

## 2023-09-02 MED ORDER — OMEGA-3-ACID ETHYL ESTERS 1 G PO CAPS: 2.0000 g | ORAL_CAPSULE | Freq: Every day | ORAL | 3 refills | Status: DC

## 2023-09-02 NOTE — Progress Notes (Signed)
Subjective:    Patient ID: Dwayne Young, male    DOB: Dec 12, 1950, 72 y.o.   MRN: [id number] I have been seeing the patient every 6 months due to chronic kidney disease.  Overall he is doing outstanding.  His most recent lab work is listed below and his GFR has risen to greater than 70.  He has been avoiding all NSAIDs and drink plenty of water.  His blood pressure today is excellent 132/82.  Patient's cholesterol is outstanding.  2 years ago he had a coronary artery calcium score of 0.  Therefore we have decided to reduce his dose of Crestor from 10 mg a day to 5 mg a day.  He is due for a colonoscopy.  We reviewed his immunization records and they are all up-to-date.  He is recovering well from his left shoulder rotator cuff repair Lab on 08/28/2023  Component Date Value Ref Range Status   WBC 08/28/2023 6.9  3.8 - 10.8 Thousand/uL Final   RBC 08/28/2023 5.11  4.20 - 5.80 Million/uL Final   Hemoglobin 08/28/2023 15.5  13.2 - 17.1 g/dL Final   HCT 69/62/9528 47.2  38.5 - 50.0 % Final   MCV 08/28/2023 92.4  80.0 - 100.0 fL Final   MCH 08/28/2023 30.3  27.0 - 33.0 pg Final   MCHC 08/28/2023 32.8  32.0 - 36.0 g/dL Final   Comment: For adults, a slight decrease in the calculated MCHC value (in the range of 30 to 32 g/dL) is most likely not clinically significant; however, it should be interpreted with caution in correlation with other red cell parameters and the patient's clinical condition.    RDW 08/28/2023 13.0  11.0 - 15.0 % Final   Platelets 08/28/2023 208  140 - 400 Thousand/uL Final   MPV 08/28/2023 10.2  7.5 - 12.5 fL Final   Neutro Abs 08/28/2023 3,174  1,500 - 7,800 cells/uL Final   Absolute Lymphocytes 08/28/2023 3,105  850 - 3,900 cells/uL Final   Absolute Monocytes 08/28/2023 483  200 - 950 cells/uL Final   Eosinophils Absolute 08/28/2023 110  15 - 500 cells/uL Final   Basophils Absolute 08/28/2023 28  0 - 200 cells/uL Final   Neutrophils Relative % 08/28/2023 46  % Final    Total Lymphocyte 08/28/2023 45.0  % Final   Monocytes Relative 08/28/2023 7.0  % Final   Eosinophils Relative 08/28/2023 1.6  % Final   Basophils Relative 08/28/2023 0.4  % Final   Glucose, Bld 08/28/2023 96  65 - 99 mg/dL Final   Comment: .            Fasting reference interval .    BUN 08/28/2023 16  7 - 25 mg/dL Final   Creat 41/32/4401 1.08  0.70 - 1.28 mg/dL Final   eGFR 02/72/5366 72  > OR = 60 mL/min/1.42m2 Final   BUN/Creatinine Ratio 08/28/2023 SEE NOTE:  6 - 22 (calc) Final   Comment:    Not Reported: BUN and Creatinine are within    reference range. .    Sodium 08/28/2023 139  135 - 146 mmol/L Final   Potassium 08/28/2023 4.7  3.5 - 5.3 mmol/L Final   Chloride 08/28/2023 104  98 - 110 mmol/L Final   CO2 08/28/2023 29  20 - 32 mmol/L Final   Calcium 08/28/2023 9.3  8.6 - 10.3 mg/dL Final   Total Protein 44/12/4740 6.5  6.1 - 8.1 g/dL Final   Albumin 59/56/3875 4.1  3.6 - 5.1 g/dL Final  Globulin 08/28/2023 2.4  1.9 - 3.7 g/dL (calc) Final   AG Ratio 08/28/2023 1.7  1.0 - 2.5 (calc) Final   Total Bilirubin 08/28/2023 0.5  0.2 - 1.2 mg/dL Final   Alkaline phosphatase (APISO) 08/28/2023 75  35 - 144 U/L Final   AST 08/28/2023 21  10 - 35 U/L Final   ALT 08/28/2023 15  9 - 46 U/L Final   Cholesterol 08/28/2023 133  <200 mg/dL Final   HDL 29/52/8413 55  > OR = 40 mg/dL Final   Triglycerides 24/40/1027 65  <150 mg/dL Final   LDL Cholesterol (Calc) 08/28/2023 64  mg/dL (calc) Final   Comment: Reference range: <100 . Desirable range <100 mg/dL for primary prevention;   <70 mg/dL for patients with CHD or diabetic patients  with > or = 2 CHD risk factors. Marland Kitchen LDL-C is now calculated using the Martin-Hopkins  calculation, which is a validated novel method providing  better accuracy than the Friedewald equation in the  estimation of LDL-C.  Horald Pollen et al. Lenox Ahr. 2536;644(03): 2061-2068  (http://education.[facility].com/faq/FAQ164)    Total CHOL/HDL Ratio 08/28/2023  2.4  <5.0 (calc) Final   Non-HDL Cholesterol (Calc) 08/28/2023 78  <130 mg/dL (calc) Final   Comment: For patients with diabetes plus 1 major ASCVD risk  factor, treating to a non-HDL-C goal of <100 mg/dL  (LDL-C of <47 mg/dL) is considered a therapeutic  option.    PSA 08/28/2023 <0.04  < OR = 4.00 ng/mL Final   Comment: The total PSA value from this assay system is  standardized against the WHO standard. The test  result will be approximately 20% lower when compared  to the equimolar-standardized total PSA (Beckman  Coulter). Comparison of serial PSA results should be  interpreted with this fact in mind. . This test was performed using the Siemens  chemiluminescent method. Values obtained from  different assay methods cannot be used interchangeably. PSA levels, regardless of value, should not be interpreted as absolute evidence of the presence or absence of disease.     Past Medical History:  Diagnosis Date   Arthritis    arthritis-generalized-knees, elbows   Elevated PSA    Hypercholesteremia    Hyperlipidemia    Prostate cancer (HCC)    dx. prostate cancer after bx. 2'15-now surgery planned   Retinal detachment    Seasonal allergies    Past Surgical History:  Procedure Laterality Date   COLONOSCOPY N/A 04/27/2013   Procedure: COLONOSCOPY;  Surgeon: Dalia Heading, MD;  Location: [facility];  Service: Gastroenterology;  Laterality: N/A;   GANGLION CYST EXCISION Right    KNEE ARTHROSCOPY Bilateral    open procedure   LYMPHADENECTOMY Bilateral 03/03/2014   Procedure: LYMPHADENECTOMY;  Surgeon: Heloise Purpura, MD;  Location: [facility];  Service: Urology;  Laterality: Bilateral;   NASAL SEPTOPLASTY W/ TURBINOPLASTY     PROSTATE SURGERY N/A    [employer] 07/08/2020   ROBOT ASSISTED LAPAROSCOPIC RADICAL PROSTATECTOMY N/A 03/03/2014   Procedure: ROBOTIC ASSISTED LAPAROSCOPIC RADICAL PROSTATECTOMY LEVEL 2;  Surgeon: Heloise Purpura, MD;  Location: [facility];  Service: Urology;   Laterality: N/A;   TONSILLECTOMY     child   TOOTH EXTRACTION     preparing for tooth implant   VASECTOMY N/A    [employer] 07/08/2020   Current Outpatient Medications on File Prior to Visit  Medication Sig Dispense Refill   diclofenac Sodium (VOLTAREN) 1 % GEL Apply 1 application topically daily as needed (pain).     LORazepam (ATIVAN)  1 MG tablet TAKE (1) TABLET BY MOUTH TWICE DAILY AS NEEDED FOR ANXIETY. 20 tablet 1   omega-3 acid ethyl esters (LOVAZA) 1 g capsule Take 2 capsules (2 g total) by mouth daily. 180 capsule 3   rosuvastatin (CRESTOR) 10 MG tablet Take 1 tablet (10 mg total) by mouth daily. 90 tablet 3   No current facility-administered medications on file prior to visit.   No Known Allergies    Review of Systems  Musculoskeletal:  Positive for back pain.       Objective:   Physical Exam Constitutional:      Appearance: Normal appearance. He is normal weight.  Cardiovascular:     Rate and Rhythm: Normal rate and regular rhythm.     Heart sounds: Normal heart sounds. No murmur heard.    No friction rub. No gallop.  Pulmonary:     Effort: Pulmonary effort is normal. No respiratory distress.     Breath sounds: Normal breath sounds. No stridor. No wheezing, rhonchi or rales.  Neurological:     Mental Status: He is alert.           Assessment & Plan:  Colon cancer screening - Plan: Ambulatory referral to Gastroenterology  Stage 3a chronic kidney disease (HCC)  Pure hypercholesterolemia All the patient's immunizations are up-to-date.  I will schedule the patient for a colonoscopy as his last colonoscopy was 10 years ago.  Blood pressure is outstanding and kidney function stable.  We will reduce his Crestor from 10 mg a day to 5 mg a day due to his low cardiovascular risk.  Otherwise no changes will be made at the present time.

## 2023-09-18 ENCOUNTER — Ambulatory Visit: Payer: Medicare PPO | Admitting: General Surgery

## 2023-09-18 ENCOUNTER — Encounter: Payer: Self-pay | Admitting: General Surgery

## 2023-09-18 VITALS — BP 134/80 | HR 61 | Temp 98.1°F | Resp 12 | Ht 75.0 in | Wt 209.0 lb

## 2023-09-18 DIAGNOSIS — Z1211 Encounter for screening for malignant neoplasm of colon: Secondary | ICD-10-CM | POA: Diagnosis not present

## 2023-09-18 MED ORDER — SUTAB 1479-225-188 MG PO TABS: 24.0000 | ORAL_TABLET | Freq: Once | ORAL | 0 refills | Status: AC

## 2023-09-19 NOTE — Progress Notes (Signed)
Dwayne Young; [id number]; December 13, 1950   HPI Patient is a 72 year old white male who was referred to my care by Lynnea Ferrier for a screening colonoscopy.  Patient last had a colonoscopy in 2014.  He denies any family history of colon cancer.  He denies any blood per rectum, abnormal weight changes, diarrhea, or constipation. Past Medical History:  Diagnosis Date   Arthritis    arthritis-generalized-knees, elbows   Elevated PSA    Hypercholesteremia    Hyperlipidemia    Prostate cancer (HCC)    dx. prostate cancer after bx. 2'15-now surgery planned   Retinal detachment    Seasonal allergies     Past Surgical History:  Procedure Laterality Date   COLONOSCOPY N/A 04/27/2013   Procedure: COLONOSCOPY;  Surgeon: Dalia Heading, MD;  Location: [facility];  Service: Gastroenterology;  Laterality: N/A;   GANGLION CYST EXCISION Right    KNEE ARTHROSCOPY Bilateral    open procedure   LYMPHADENECTOMY Bilateral 03/03/2014   Procedure: LYMPHADENECTOMY;  Surgeon: Heloise Purpura, MD;  Location: [facility];  Service: Urology;  Laterality: Bilateral;   NASAL SEPTOPLASTY W/ TURBINOPLASTY     PROSTATE SURGERY N/A    [employer] 07/08/2020   ROBOT ASSISTED LAPAROSCOPIC RADICAL PROSTATECTOMY N/A 03/03/2014   Procedure: ROBOTIC ASSISTED LAPAROSCOPIC RADICAL PROSTATECTOMY LEVEL 2;  Surgeon: Heloise Purpura, MD;  Location: [facility];  Service: Urology;  Laterality: N/A;   TONSILLECTOMY     child   TOOTH EXTRACTION     preparing for tooth implant   VASECTOMY N/A    [employer] 07/08/2020    History reviewed. No pertinent family history.  Current Outpatient Medications on File Prior to Visit  Medication Sig Dispense Refill   diclofenac Sodium (VOLTAREN) 1 % GEL Apply 1 application topically daily as needed (pain).     LORazepam (ATIVAN) 1 MG tablet TAKE (1) TABLET BY MOUTH TWICE DAILY AS NEEDED FOR ANXIETY. 20 tablet 1   omega-3 acid ethyl esters (LOVAZA) 1 g capsule Take 2 capsules (2 g total) by mouth daily.  180 capsule 3   rosuvastatin (CRESTOR) 10 MG tablet Take 1 tablet (10 mg total) by mouth daily. (Patient taking differently: Take 5 mg by mouth daily.) 90 tablet 3   No current facility-administered medications on file prior to visit.    No Known Allergies  Social History   Substance and Sexual Activity  Alcohol Use Yes   Alcohol/week: 1.0 standard drink of alcohol   Types: 1 Glasses of wine per week   Comment: moderate - wine every 3 rd day    Social History   Tobacco Use  Smoking Status Former   Current packs/day: 0.00   Average packs/day: 0.5 packs/day for 15.0 years (7.5 ttl pk-yrs)   Types: Cigarettes   Start date: 04/27/1974   Quit date: 04/27/1989   Years since quitting: 34.4  Smokeless Tobacco Never    Review of Systems  Constitutional: Negative.   HENT: Negative.    Eyes: Negative.   Respiratory: Negative.    Cardiovascular: Negative.   Gastrointestinal: Negative.   Genitourinary: Negative.   Musculoskeletal:  Positive for joint pain.  Skin: Negative.   Neurological: Negative.   Endo/Heme/Allergies: Negative.   Psychiatric/Behavioral: Negative.      Objective   Vitals:   09/18/23 0945  BP: 134/80  Pulse: 61  Resp: 12  Temp: 98.1 F (36.7 C)  SpO2: 93%    Physical Exam Vitals reviewed.  Constitutional:      Appearance: Normal appearance. He is  normal weight. He is not ill-appearing.  HENT:     Head: Normocephalic and atraumatic.  Cardiovascular:     Rate and Rhythm: Normal rate and regular rhythm.     Heart sounds: Normal heart sounds. No murmur heard.    No friction rub. No gallop.  Pulmonary:     Effort: Pulmonary effort is normal. No respiratory distress.     Breath sounds: Normal breath sounds. No stridor. No wheezing, rhonchi or rales.  Abdominal:     General: Bowel sounds are normal. There is no distension.     Palpations: Abdomen is soft. There is no mass.     Tenderness: There is no abdominal tenderness. There is no guarding or  rebound.     Hernia: No hernia is present.  Skin:    General: Skin is warm and dry.  Neurological:     Mental Status: He is alert and oriented to person, place, and time.    Previous colonoscopy report reviewed Assessment  Need for screening colonoscopy Plan  Patient is scheduled for a screening colonoscopy on 10/21/2023.  The risks and benefits of the procedure including bleeding and perforation were fully explained to the patient, who gave informed consent.  Sutabs have been prescribed for preoperative bowel preparation.

## 2023-09-30 NOTE — H&P (Signed)
 Dwayne Young; [id number]; December 13, 1950   HPI Patient is a 72 year old white male who was referred to my care by Lynnea Ferrier for a screening colonoscopy.  Patient last had a colonoscopy in 2014.  He denies any family history of colon cancer.  He denies any blood per rectum, abnormal weight changes, diarrhea, or constipation. Past Medical History:  Diagnosis Date   Arthritis    arthritis-generalized-knees, elbows   Elevated PSA    Hypercholesteremia    Hyperlipidemia    Prostate cancer (HCC)    dx. prostate cancer after bx. 2'15-now surgery planned   Retinal detachment    Seasonal allergies     Past Surgical History:  Procedure Laterality Date   COLONOSCOPY N/A 04/27/2013   Procedure: COLONOSCOPY;  Surgeon: Dalia Heading, MD;  Location: [facility];  Service: Gastroenterology;  Laterality: N/A;   GANGLION CYST EXCISION Right    KNEE ARTHROSCOPY Bilateral    open procedure   LYMPHADENECTOMY Bilateral 03/03/2014   Procedure: LYMPHADENECTOMY;  Surgeon: Heloise Purpura, MD;  Location: [facility];  Service: Urology;  Laterality: Bilateral;   NASAL SEPTOPLASTY W/ TURBINOPLASTY     PROSTATE SURGERY N/A    [employer] 07/08/2020   ROBOT ASSISTED LAPAROSCOPIC RADICAL PROSTATECTOMY N/A 03/03/2014   Procedure: ROBOTIC ASSISTED LAPAROSCOPIC RADICAL PROSTATECTOMY LEVEL 2;  Surgeon: Heloise Purpura, MD;  Location: [facility];  Service: Urology;  Laterality: N/A;   TONSILLECTOMY     child   TOOTH EXTRACTION     preparing for tooth implant   VASECTOMY N/A    [employer] 07/08/2020    History reviewed. No pertinent family history.  Current Outpatient Medications on File Prior to Visit  Medication Sig Dispense Refill   diclofenac Sodium (VOLTAREN) 1 % GEL Apply 1 application topically daily as needed (pain).     LORazepam (ATIVAN) 1 MG tablet TAKE (1) TABLET BY MOUTH TWICE DAILY AS NEEDED FOR ANXIETY. 20 tablet 1   omega-3 acid ethyl esters (LOVAZA) 1 g capsule Take 2 capsules (2 g total) by mouth daily.  180 capsule 3   rosuvastatin (CRESTOR) 10 MG tablet Take 1 tablet (10 mg total) by mouth daily. (Patient taking differently: Take 5 mg by mouth daily.) 90 tablet 3   No current facility-administered medications on file prior to visit.    No Known Allergies  Social History   Substance and Sexual Activity  Alcohol Use Yes   Alcohol/week: 1.0 standard drink of alcohol   Types: 1 Glasses of wine per week   Comment: moderate - wine every 3 rd day    Social History   Tobacco Use  Smoking Status Former   Current packs/day: 0.00   Average packs/day: 0.5 packs/day for 15.0 years (7.5 ttl pk-yrs)   Types: Cigarettes   Start date: 04/27/1974   Quit date: 04/27/1989   Years since quitting: 34.4  Smokeless Tobacco Never    Review of Systems  Constitutional: Negative.   HENT: Negative.    Eyes: Negative.   Respiratory: Negative.    Cardiovascular: Negative.   Gastrointestinal: Negative.   Genitourinary: Negative.   Musculoskeletal:  Positive for joint pain.  Skin: Negative.   Neurological: Negative.   Endo/Heme/Allergies: Negative.   Psychiatric/Behavioral: Negative.      Objective   Vitals:   09/18/23 0945  BP: 134/80  Pulse: 61  Resp: 12  Temp: 98.1 F (36.7 C)  SpO2: 93%    Physical Exam Vitals reviewed.  Constitutional:      Appearance: Normal appearance. He is  normal weight. He is not ill-appearing.  HENT:     Head: Normocephalic and atraumatic.  Cardiovascular:     Rate and Rhythm: Normal rate and regular rhythm.     Heart sounds: Normal heart sounds. No murmur heard.    No friction rub. No gallop.  Pulmonary:     Effort: Pulmonary effort is normal. No respiratory distress.     Breath sounds: Normal breath sounds. No stridor. No wheezing, rhonchi or rales.  Abdominal:     General: Bowel sounds are normal. There is no distension.     Palpations: Abdomen is soft. There is no mass.     Tenderness: There is no abdominal tenderness. There is no guarding or  rebound.     Hernia: No hernia is present.  Skin:    General: Skin is warm and dry.  Neurological:     Mental Status: He is alert and oriented to person, place, and time.    Previous colonoscopy report reviewed Assessment  Need for screening colonoscopy Plan  Patient is scheduled for a screening colonoscopy on 10/21/2023.  The risks and benefits of the procedure including bleeding and perforation were fully explained to the patient, who gave informed consent.  Sutabs have been prescribed for preoperative bowel preparation.

## 2023-10-21 ENCOUNTER — Other Ambulatory Visit: Payer: Self-pay

## 2023-10-21 ENCOUNTER — Ambulatory Visit (HOSPITAL_COMMUNITY): Payer: Self-pay | Admitting: Anesthesiology

## 2023-10-21 ENCOUNTER — Encounter (HOSPITAL_COMMUNITY)
Admission: RE | Disposition: A | Discharge status: Home / Self Care | Payer: Self-pay | Source: Ambulatory Visit | Attending: General Surgery

## 2023-10-21 ENCOUNTER — Encounter (HOSPITAL_COMMUNITY): Payer: Self-pay | Admitting: General Surgery

## 2023-10-21 ENCOUNTER — Ambulatory Visit (HOSPITAL_COMMUNITY)
Admission: RE | Admit: 2023-10-21 | Discharge: 2023-10-21 | Disposition: A | Discharge status: Home / Self Care | Payer: Medicare PPO | Source: Ambulatory Visit | Attending: General Surgery | Admitting: General Surgery

## 2023-10-21 ENCOUNTER — Ambulatory Visit (HOSPITAL_BASED_OUTPATIENT_CLINIC_OR_DEPARTMENT_OTHER): Payer: Medicare PPO | Admitting: Anesthesiology

## 2023-10-21 DIAGNOSIS — Z1211 Encounter for screening for malignant neoplasm of colon: Secondary | ICD-10-CM

## 2023-10-21 DIAGNOSIS — I10 Essential (primary) hypertension: Secondary | ICD-10-CM | POA: Diagnosis not present

## 2023-10-21 DIAGNOSIS — K5730 Diverticulosis of large intestine without perforation or abscess without bleeding: Secondary | ICD-10-CM

## 2023-10-21 DIAGNOSIS — Z87891 Personal history of nicotine dependence: Secondary | ICD-10-CM | POA: Diagnosis not present

## 2023-10-21 DIAGNOSIS — K648 Other hemorrhoids: Secondary | ICD-10-CM

## 2023-10-21 HISTORY — PX: COLONOSCOPY WITH PROPOFOL: SHX5780

## 2023-10-21 SURGERY — COLONOSCOPY WITH PROPOFOL
Anesthesia: General

## 2023-10-21 MED ORDER — LIDOCAINE HCL (PF) 2 % IJ SOLN
INTRAMUSCULAR | Status: DC | PRN
  Administered 2023-10-21: 50 mg via INTRADERMAL

## 2023-10-21 MED ORDER — PROPOFOL 500 MG/50ML IV EMUL
INTRAVENOUS | Status: DC | PRN
  Administered 2023-10-21: 150 ug/kg/min via INTRAVENOUS

## 2023-10-21 MED ORDER — PROPOFOL 10 MG/ML IV BOLUS
INTRAVENOUS | Status: DC | PRN
  Administered 2023-10-21: 50 mg via INTRAVENOUS
  Administered 2023-10-21: 100 mg via INTRAVENOUS

## 2023-10-21 MED ORDER — LACTATED RINGERS IV SOLN: INTRAVENOUS | Status: DC

## 2023-10-21 MED ORDER — LACTATED RINGERS IV SOLN: INTRAVENOUS | Status: DC | PRN

## 2023-10-21 NOTE — Interval H&P Note (Signed)
 History and Physical Interval Note:  10/21/2023 7:15 AM  Dwayne Young  has presented today for surgery, with the diagnosis of SCREENING FOR COLON CANCER.  The various methods of treatment have been discussed with the patient and family. After consideration of risks, benefits and other options for treatment, the patient has consented to  Procedure(s): COLONOSCOPY WITH PROPOFOL  (N/A) as a surgical intervention.  The patient's history has been reviewed, patient examined, no change in status, stable for surgery.  I have reviewed the patient's chart and labs.  Questions were answered to the patient's satisfaction.     Oneil Budge

## 2023-10-21 NOTE — Transfer of Care (Signed)
 Immediate Anesthesia Transfer of Care Note  Patient: Dwayne Young  Procedure(s) Performed: COLONOSCOPY WITH PROPOFOL   Patient Location: Endoscopy Unit  Anesthesia Type:General  Level of Consciousness: drowsy  Airway & Oxygen Therapy: Patient Spontanous Breathing  Post-op Assessment: Report given to RN and Post -op Vital signs reviewed and stable  Post vital signs: Reviewed and stable  Last Vitals:  Vitals Value Taken Time  BP    Temp    Pulse    Resp    SpO2      Last Pain:  Vitals:   10/21/23 0718  TempSrc:   PainSc: 0-No pain      Patients Stated Pain Goal: 8 (10/21/23 0655)  Complications: No notable events documented.

## 2023-10-21 NOTE — Anesthesia Preprocedure Evaluation (Signed)
 Anesthesia Evaluation  Patient identified by MRN, date of birth, ID band Patient awake    Reviewed: Allergy & Precautions, H&P , NPO status , Patient's Chart, lab work & pertinent test results, reviewed documented beta blocker date and time   Airway Mallampati: II  TM Distance: >3 FB Neck ROM: full    Dental no notable dental hx.    Pulmonary neg pulmonary ROS, former smoker   Pulmonary exam normal breath sounds clear to auscultation       Cardiovascular Exercise Tolerance: Good hypertension, negative cardio ROS  Rhythm:regular Rate:Normal     Neuro/Psych  Headaches negative neurological ROS  negative psych ROS   GI/Hepatic negative GI ROS, Neg liver ROS,,,  Endo/Other  negative endocrine ROS    Renal/GU negative Renal ROS  negative genitourinary   Musculoskeletal   Abdominal   Peds  Hematology negative hematology ROS (+)   Anesthesia Other Findings   Reproductive/Obstetrics negative OB ROS                             Anesthesia Physical Anesthesia Plan  ASA: 2  Anesthesia Plan: General   Post-op Pain Management:    Induction:   PONV Risk Score and Plan: Propofol infusion  Airway Management Planned:   Additional Equipment:   Intra-op Plan:   Post-operative Plan:   Informed Consent: I have reviewed the patients History and Physical, chart, labs and discussed the procedure including the risks, benefits and alternatives for the proposed anesthesia with the patient or authorized representative who has indicated his/her understanding and acceptance.     Dental Advisory Given  Plan Discussed with: CRNA  Anesthesia Plan Comments:        Anesthesia Quick Evaluation

## 2023-10-21 NOTE — Anesthesia Procedure Notes (Signed)
 Date/Time: 10/21/2023 7:25 AM  Performed by: Eliodoro Deward FALCON, CRNAPre-anesthesia Checklist: Patient identified, Emergency Drugs available, Suction available and Patient being monitored Patient Re-evaluated:Patient Re-evaluated prior to induction Oxygen Delivery Method: Simple face mask Induction Type: IV induction Placement Confirmation: positive ETCO2

## 2023-10-21 NOTE — Op Note (Signed)
 [facility] Patient Name: Dwayne Young Procedure Date: 10/21/2023 7:02 AM MRN: [id number] Date of Birth: 03-17-1951 Attending MD: Oneil Budge , MD, [id number] CSN: [id number] Age: 73 Admit Type: Outpatient Procedure:                Colonoscopy Indications:              Screening for colorectal malignant neoplasm Providers:                Oneil Budge, MD, Crystal Page, Bascom Blush Referring MD:              Medicines:                Propofol  per Anesthesia Complications:            No immediate complications. Estimated Blood Loss:     Estimated blood loss: none. Procedure:                Pre-Anesthesia Assessment:                           - Prior to the procedure, a History and Physical                            was performed, and patient medications and                            allergies were reviewed. The patient is competent.                            The risks and benefits of the procedure and the                            sedation options and risks were discussed with the                            patient. All questions were answered and informed                            consent was obtained. Patient identification and                            proposed procedure were verified by the physician,                            the nurse, the anesthesiologist and the anesthetist                            in the endoscopy suite. Mental Status Examination:                            alert and oriented. Airway Examination: normal                            oropharyngeal airway and neck mobility. Respiratory  Examination: clear to auscultation. CV Examination:                            RRR, no murmurs, no S3 or S4. Prophylactic                            Antibiotics: The patient does not require                            prophylactic antibiotics. Prior Anticoagulants: The                            patient has taken no anticoagulant or  antiplatelet                            agents. ASA Grade Assessment: II - A patient with                            mild systemic disease. After reviewing the risks                            and benefits, the patient was deemed in                            satisfactory condition to undergo the procedure.                            The anesthesia plan was to use monitored anesthesia                            care (MAC). Immediately prior to administration of                            medications, the patient was re-assessed for                            adequacy to receive sedatives. The heart rate,                            respiratory rate, oxygen saturations, blood                            pressure, adequacy of pulmonary ventilation, and                            response to care were monitored throughout the                            procedure. The physical status of the patient was                            re-assessed after the procedure.  After obtaining informed consent, the colonoscope                            was passed under direct vision. Throughout the                            procedure, the patient's blood pressure, pulse, and                            oxygen saturations were monitored continuously. The                            [phone]) scope was introduced through the                            anus and advanced to the the cecum, identified by                            the appendiceal orifice, ileocecal valve and                            palpation. No anatomical landmarks were                            photographed. The entire colon was well visualized.                            The colonoscopy was performed without difficulty.                            The patient tolerated the procedure well. The                            quality of the bowel preparation was adequate. The                            total duration  of the procedure was 10 minutes. Scope In: 7:20:40 AM Scope Out: 7:31:02 AM Scope Withdrawal Time: 0 hours 4 minutes 38 seconds  Total Procedure Duration: 0 hours 10 minutes 22 seconds  Findings:      The perianal and digital rectal examinations were normal.      A few medium-mouthed diverticula were found in the sigmoid colon.       Estimated blood loss: none.      The exam was otherwise without abnormality on direct and retroflexion       views. Impression:               - Diverticulosis in the sigmoid colon.                           - The examination was otherwise normal on direct                            and retroflexion views.                           -  No specimens collected. Moderate Sedation:      Moderate (conscious) sedation was administered by the nurse and       supervised by the endoscopist. The patient's oxygen saturation, heart       rate, blood pressure and response to care were monitored. Recommendation:           - Written discharge instructions were provided to                            the patient.                           - The signs and symptoms of potential delayed                            complications were discussed with the patient.                           - Patient has a contact number available for                            emergencies.                           - Return to normal activities tomorrow.                           - Resume previous diet.                           - Continue present medications.                           -Given patient's age, no need for further screening                            colonoscopy Procedure Code(s):        --- Professional ---                           [phone], Colonoscopy, flexible; diagnostic, including                            collection of specimen(s) by brushing or washing,                            when performed (separate procedure) Diagnosis Code(s):        --- Professional ---                            Z12.11, Encounter for screening for malignant                            neoplasm of colon                           K57.30, Diverticulosis of large intestine without  perforation or abscess without bleeding CPT copyright 2022 American [employer] Association. All rights reserved. The codes documented in this report are preliminary and upon coder review may  be revised to meet current compliance requirements. Oneil Budge, MD Oneil Budge, MD 10/21/2023 7:36:17 AM This report has been signed electronically. Number of Addenda: 0

## 2023-10-23 NOTE — Anesthesia Postprocedure Evaluation (Signed)
 Anesthesia Post Note  Patient: Taft VEAR Hawks  Procedure(s) Performed: COLONOSCOPY WITH PROPOFOL   Patient location during evaluation: Phase II Anesthesia Type: General Level of consciousness: awake Pain management: pain level controlled Vital Signs Assessment: post-procedure vital signs reviewed and stable Respiratory status: spontaneous breathing and respiratory function stable Cardiovascular status: blood pressure returned to baseline and stable Postop Assessment: no headache and no apparent nausea or vomiting Anesthetic complications: no Comments: Late entry   No notable events documented.   Last Vitals:  Vitals:   10/21/23 0655 10/21/23 0733  BP: (!) 161/81 (!) 154/87  Pulse: 62 85  Resp:  (!) 24  Temp: 36.5 C (!) 36.3 C  SpO2: 99% 96%    Last Pain:  Vitals:   10/21/23 0733  TempSrc: Axillary  PainSc: 0-No pain                 Yvonna JINNY Bosworth

## 2023-10-24 ENCOUNTER — Encounter (HOSPITAL_COMMUNITY): Payer: Self-pay | Admitting: General Surgery

## 2024-02-05 ENCOUNTER — Other Ambulatory Visit: Payer: Self-pay

## 2024-02-05 ENCOUNTER — Telehealth: Payer: Self-pay

## 2024-02-05 ENCOUNTER — Other Ambulatory Visit: Payer: Self-pay | Admitting: Family Medicine

## 2024-02-05 MED ORDER — ROSUVASTATIN CALCIUM 10 MG PO TABS: 10.0000 mg | ORAL_TABLET | Freq: Every day | ORAL | 3 refills | Status: AC

## 2024-02-05 NOTE — Telephone Encounter (Signed)
 Copied from CRM [id number]. Topic: Clinical - Medication Refill >> Feb 05, 2024  9:22 AM Baldemar Lev wrote: Most Recent Primary Care Visit:  Provider: Eliane Grooms T  Department: [facility]  Visit Type: OFFICE VISIT  Date: 09/02/2023  Medication: rosuvastatin  (CRESTOR ) 10 MG tablet  Has the patient contacted their pharmacy? Yes (Agent: If no, request that the patient contact the pharmacy for the refill. If patient does not wish to contact the pharmacy document the reason why and proceed with request.) (Agent: If yes, when and what did the pharmacy advise?)  Is this the correct pharmacy for this prescription? Yes If no, delete pharmacy and type the correct one.  This is the patient's preferred pharmacy:  [facility] [place], [place] - [id number] [place] [address] Phone: [phone] Fax: [phone]   Has the prescription been filled recently? Yes  Is the patient out of the medication? Yes  Has the patient been seen for an appointment in the last year OR does the patient have an upcoming appointment? Yes  Can we respond through MyChart? Yes  Agent: Please be advised that Rx refills may take up to 3 business days. We ask that you follow-up with your pharmacy.

## 2024-02-05 NOTE — Telephone Encounter (Signed)
 Copied from CRM [id number]. Topic: Clinical - Request for Lab/Test Order >> Feb 05, 2024  9:20 AM Dwayne Young wrote: Reason for CRM: Pt wants to have his PSA, Metabolic panel, and Lipids checked.

## 2024-02-06 NOTE — Telephone Encounter (Signed)
 Refused Crestor  10 mg refill.   This was handled on 02/05/2024 #90, 3 refills was sent to [facility].   This is a duplicate.

## 2024-02-25 ENCOUNTER — Other Ambulatory Visit

## 2024-02-25 DIAGNOSIS — E785 Hyperlipidemia, unspecified: Secondary | ICD-10-CM

## 2024-02-25 DIAGNOSIS — C61 Malignant neoplasm of prostate: Secondary | ICD-10-CM | POA: Diagnosis not present

## 2024-02-25 DIAGNOSIS — E7800 Pure hypercholesterolemia, unspecified: Secondary | ICD-10-CM

## 2024-02-25 DIAGNOSIS — N1831 Chronic kidney disease, stage 3a: Secondary | ICD-10-CM

## 2024-02-26 ENCOUNTER — Ambulatory Visit: Admitting: Family Medicine

## 2024-02-26 ENCOUNTER — Other Ambulatory Visit

## 2024-02-26 ENCOUNTER — Encounter: Payer: Self-pay | Admitting: Family Medicine

## 2024-02-26 VITALS — BP 136/82 | HR 81 | Temp 98.4°F | Ht 75.0 in | Wt 209.4 lb

## 2024-02-26 DIAGNOSIS — K7460 Unspecified cirrhosis of liver: Secondary | ICD-10-CM

## 2024-02-26 DIAGNOSIS — Z8546 Personal history of malignant neoplasm of prostate: Secondary | ICD-10-CM | POA: Diagnosis not present

## 2024-02-26 DIAGNOSIS — N1831 Chronic kidney disease, stage 3a: Secondary | ICD-10-CM | POA: Diagnosis not present

## 2024-02-26 LAB — CBC WITH DIFFERENTIAL/PLATELET
Absolute Lymphocytes: 3427 {cells}/uL (ref 850–3900)
Absolute Monocytes: 461 {cells}/uL (ref 200–950)
Basophils Absolute: 29 {cells}/uL (ref 0–200)
Basophils Relative: 0.4 %
Eosinophils Absolute: 101 {cells}/uL (ref 15–500)
Eosinophils Relative: 1.4 %
HCT: 48.4 % (ref 38.5–50.0)
Hemoglobin: 15.9 g/dL (ref 13.2–17.1)
MCH: 30.9 pg (ref 27.0–33.0)
MCHC: 32.9 g/dL (ref 32.0–36.0)
MCV: 94 fL (ref 80.0–100.0)
MPV: 10.1 fL (ref 7.5–12.5)
Monocytes Relative: 6.4 %
Neutro Abs: 3182 {cells}/uL (ref 1500–7800)
Neutrophils Relative %: 44.2 %
Platelets: 222 10*3/uL (ref 140–400)
RBC: 5.15 10*6/uL (ref 4.20–5.80)
RDW: 12.8 % (ref 11.0–15.0)
Total Lymphocyte: 47.6 %
WBC: 7.2 10*3/uL (ref 3.8–10.8)

## 2024-02-26 LAB — COMPLETE METABOLIC PANEL WITHOUT GFR
AG Ratio: 1.9 (calc) (ref 1.0–2.5)
ALT: 15 U/L (ref 9–46)
AST: 19 U/L (ref 10–35)
Albumin: 4.3 g/dL (ref 3.6–5.1)
Alkaline phosphatase (APISO): 71 U/L (ref 35–144)
BUN: 21 mg/dL (ref 7–25)
CO2: 29 mmol/L (ref 20–32)
Calcium: 9 mg/dL (ref 8.6–10.3)
Chloride: 104 mmol/L (ref 98–110)
Creat: 1.13 mg/dL (ref 0.70–1.28)
Globulin: 2.3 g/dL (ref 1.9–3.7)
Glucose, Bld: 103 mg/dL — ABNORMAL HIGH (ref 65–99)
Potassium: 4.5 mmol/L (ref 3.5–5.3)
Sodium: 139 mmol/L (ref 135–146)
Total Bilirubin: 0.6 mg/dL (ref 0.2–1.2)
Total Protein: 6.6 g/dL (ref 6.1–8.1)

## 2024-02-26 LAB — LIPID PANEL
Cholesterol: 152 mg/dL (ref <200)
HDL: 53 mg/dL (ref >=40)
LDL Cholesterol (Calc): 80 mg/dL
Non-HDL Cholesterol (Calc): 99 mg/dL (ref <130)
Total CHOL/HDL Ratio: 2.9 (calc) (ref <5.0)
Triglycerides: 100 mg/dL (ref <150)

## 2024-02-26 LAB — PSA: PSA: 0.04 ng/mL (ref <=4.00)

## 2024-02-26 MED ORDER — LORAZEPAM 1 MG PO TABS: ORAL_TABLET | ORAL | 1 refills | Status: DC

## 2024-02-26 NOTE — Progress Notes (Signed)
 Subjective:    Patient ID: Dwayne Young, male    DOB: 1951/04/02, 73 y.o.   MRN: [id number]  Patient is here today for follow-up.  His PSA remains virtually undetectable.  His blood pressure is outstanding.  He denies any concerns.  His most recent lab work was outstanding.  Liver function test are normal.  Creatinine remained stable.  Physically he is doing well.  He denies any chest pain or shortness of breath or dyspnea on exertion. Lab on 02/25/2024  Component Date Value Ref Range Status   Glucose, Bld 02/25/2024 103 (H)  65 - 99 mg/dL Final   Comment: .            Fasting reference interval . For someone without known diabetes, a glucose value between 100 and 125 mg/dL is consistent with prediabetes and should be confirmed with a follow-up test. .    BUN 02/25/2024 21  7 - 25 mg/dL Final   Creat 40/98/1191 1.13  0.70 - 1.28 mg/dL Final   BUN/Creatinine Ratio 02/25/2024 SEE NOTE:  6 - 22 (calc) Final   Comment:    Not Reported: BUN and Creatinine are within    reference range. .    Sodium 02/25/2024 139  135 - 146 mmol/L Final   Potassium 02/25/2024 4.5  3.5 - 5.3 mmol/L Final   Chloride 02/25/2024 104  98 - 110 mmol/L Final   CO2 02/25/2024 29  20 - 32 mmol/L Final   Calcium  02/25/2024 9.0  8.6 - 10.3 mg/dL Final   Total Protein 47/82/9562 6.6  6.1 - 8.1 g/dL Final   Albumin 13/05/6577 4.3  3.6 - 5.1 g/dL Final   Globulin 46/96/2952 2.3  1.9 - 3.7 g/dL (calc) Final   AG Ratio 02/25/2024 1.9  1.0 - 2.5 (calc) Final   Total Bilirubin 02/25/2024 0.6  0.2 - 1.2 mg/dL Final   Alkaline phosphatase (APISO) 02/25/2024 71  35 - 144 U/L Final   AST 02/25/2024 19  10 - 35 U/L Final   ALT 02/25/2024 15  9 - 46 U/L Final   WBC 02/25/2024 7.2  3.8 - 10.8 Thousand/uL Final   RBC 02/25/2024 5.15  4.20 - 5.80 Million/uL Final   Hemoglobin 02/25/2024 15.9  13.2 - 17.1 g/dL Final   HCT 84/13/2440 48.4  38.5 - 50.0 % Final   MCV 02/25/2024 94.0  80.0 - 100.0 fL Final   MCH 02/25/2024  30.9  27.0 - 33.0 pg Final   MCHC 02/25/2024 32.9  32.0 - 36.0 g/dL Final   Comment: For adults, a slight decrease in the calculated MCHC value (in the range of 30 to 32 g/dL) is most likely not clinically significant; however, it should be interpreted with caution in correlation with other red cell parameters and the patient's clinical condition.    RDW 02/25/2024 12.8  11.0 - 15.0 % Final   Platelets 02/25/2024 222  140 - 400 Thousand/uL Final   MPV 02/25/2024 10.1  7.5 - 12.5 fL Final   Neutro Abs 02/25/2024 3,182  1,500 - 7,800 cells/uL Final   Absolute Lymphocytes 02/25/2024 3,427  850 - 3,900 cells/uL Final   Absolute Monocytes 02/25/2024 461  200 - 950 cells/uL Final   Eosinophils Absolute 02/25/2024 101  15 - 500 cells/uL Final   Basophils Absolute 02/25/2024 29  0 - 200 cells/uL Final   Neutrophils Relative % 02/25/2024 44.2  % Final   Total Lymphocyte 02/25/2024 47.6  % Final   Monocytes Relative 02/25/2024 6.4  %  Final   Eosinophils Relative 02/25/2024 1.4  % Final   Basophils Relative 02/25/2024 0.4  % Final   Cholesterol 02/25/2024 152  <200 mg/dL Final   HDL 46/96/2952 53  > OR = 40 mg/dL Final   Triglycerides 84/13/2440 100  <150 mg/dL Final   LDL Cholesterol (Calc) 02/25/2024 80  mg/dL (calc) Final   Comment: Reference range: <100 . Desirable range <100 mg/dL for primary prevention;   <70 mg/dL for patients with CHD or diabetic patients  with > or = 2 CHD risk factors. Aaron Aas LDL-C is now calculated using the Martin-Hopkins  calculation, which is a validated novel method providing  better accuracy than the Friedewald equation in the  estimation of LDL-C.  Melinda Sprawls et al. Erroll Heard. 1027;253(66): 2061-2068  (http://education.[employer].com/faq/FAQ164)    Total CHOL/HDL Ratio 02/25/2024 2.9  <4.4 (calc) Final   Non-HDL Cholesterol (Calc) 02/25/2024 99  <130 mg/dL (calc) Final   Comment: For patients with diabetes plus 1 major ASCVD risk  factor, treating to a  non-HDL-C goal of <100 mg/dL  (LDL-C of <03 mg/dL) is considered a therapeutic  option.    PSA 02/25/2024 0.04  < OR = 4.00 ng/mL Final   Comment: The total PSA value from this assay system is  standardized against the WHO standard. The test  result will be approximately 20% lower when compared  to the equimolar-standardized total PSA (Beckman  Coulter). Comparison of serial PSA results should be  interpreted with this fact in mind. . This test was performed using the Siemens  chemiluminescent method. Values obtained from  different assay methods cannot be used interchangeably. PSA levels, regardless of value, should not be interpreted as absolute evidence of the presence or absence of disease.     Past Medical History:  Diagnosis Date   Arthritis    arthritis-generalized-knees, elbows   Elevated PSA    Hypercholesteremia    Hyperlipidemia    Prostate cancer (HCC)    dx. prostate cancer after bx. 2'15-now surgery planned   Retinal detachment    Seasonal allergies    Past Surgical History:  Procedure Laterality Date   COLONOSCOPY N/A 04/27/2013   Procedure: COLONOSCOPY;  Surgeon: Beau Bound, MD;  Location: [facility];  Service: Gastroenterology;  Laterality: N/A;   COLONOSCOPY WITH PROPOFOL  N/A 10/21/2023   Procedure: COLONOSCOPY WITH PROPOFOL ;  Surgeon: Alanda Allegra, MD;  Location: [facility];  Service: Gastroenterology;  Laterality: N/A;   GANGLION CYST EXCISION Right    KNEE ARTHROSCOPY Bilateral    open procedure   LYMPHADENECTOMY Bilateral 03/03/2014   Procedure: LYMPHADENECTOMY;  Surgeon: Florencio Hunting, MD;  Location: [facility];  Service: Urology;  Laterality: Bilateral;   NASAL SEPTOPLASTY W/ TURBINOPLASTY     PROSTATE SURGERY N/A    [employer] 07/08/2020   ROBOT ASSISTED LAPAROSCOPIC RADICAL PROSTATECTOMY N/A 03/03/2014   Procedure: ROBOTIC ASSISTED LAPAROSCOPIC RADICAL PROSTATECTOMY LEVEL 2;  Surgeon: Florencio Hunting, MD;  Location: [facility];  Service: Urology;   Laterality: N/A;   TONSILLECTOMY     child   TOOTH EXTRACTION     preparing for tooth implant   VASECTOMY N/A    [employer] 07/08/2020   Current Outpatient Medications on File Prior to Visit  Medication Sig Dispense Refill   diclofenac  Sodium (VOLTAREN ) 1 % GEL Apply 1 application topically daily as needed (pain).     LORazepam  (ATIVAN ) 1 MG tablet TAKE (1) TABLET BY MOUTH TWICE DAILY AS NEEDED FOR ANXIETY. 20 tablet 1   omega-3 acid  ethyl esters (LOVAZA ) 1 g capsule Take 2 capsules (2 g total) by mouth daily. 180 capsule 3   rosuvastatin  (CRESTOR ) 10 MG tablet Take 1 tablet (10 mg total) by mouth daily. 90 tablet 3   No current facility-administered medications on file prior to visit.   No Known Allergies    Review of Systems  Musculoskeletal:  Positive for back pain.       Objective:   Physical Exam Constitutional:      Appearance: Normal appearance. He is normal weight.  Cardiovascular:     Rate and Rhythm: Normal rate and regular rhythm.     Heart sounds: Normal heart sounds. No murmur heard.    No friction rub. No gallop.  Pulmonary:     Effort: Pulmonary effort is normal. No respiratory distress.     Breath sounds: Normal breath sounds. No stridor. No wheezing, rhonchi or rales.  Abdominal:     General: Abdomen is flat. Bowel sounds are normal. There is no distension.     Palpations: Abdomen is soft.     Tenderness: There is no abdominal tenderness. There is no guarding or rebound.  Musculoskeletal:     Right lower leg: No edema.     Left lower leg: No edema.  Neurological:     General: No focal deficit present.     Mental Status: He is alert and oriented to person, place, and time. Mental status is at baseline.     Cranial Nerves: No cranial nerve deficit.     Motor: No weakness.     Gait: Gait normal.           Assessment & Plan:  Stage 3a chronic kidney disease (HCC)  Personal history of prostate cancer  Cirrhosis of liver without ascites,  unspecified hepatic cirrhosis type (HCC) Patient's lab work looks beautiful.  He seems to be doing very well.  We will make no changes in his medications at the present time.  Regular anticipatory guidance is provided.  Encouraged him to keep up the good work.

## 2024-05-13 DIAGNOSIS — Z1283 Encounter for screening for malignant neoplasm of skin: Secondary | ICD-10-CM | POA: Diagnosis not present

## 2024-05-13 DIAGNOSIS — C44511 Basal cell carcinoma of skin of breast: Secondary | ICD-10-CM | POA: Diagnosis not present

## 2024-05-13 DIAGNOSIS — X32XXXD Exposure to sunlight, subsequent encounter: Secondary | ICD-10-CM | POA: Diagnosis not present

## 2024-05-13 DIAGNOSIS — L570 Actinic keratosis: Secondary | ICD-10-CM | POA: Diagnosis not present

## 2024-05-13 DIAGNOSIS — D225 Melanocytic nevi of trunk: Secondary | ICD-10-CM | POA: Diagnosis not present

## 2024-05-13 DIAGNOSIS — L820 Inflamed seborrheic keratosis: Secondary | ICD-10-CM | POA: Diagnosis not present

## 2024-06-07 ENCOUNTER — Telehealth: Payer: Self-pay

## 2024-06-07 NOTE — Telephone Encounter (Signed)
 Copied from CRM #[id number]. Topic: General - Other >> Jun 07, 2024  1:02 PM Charlet HERO wrote: Reason for CRM: Patient is calling requesting to speak with Dr Breck nurse Ronal Hun on the number listed on his char.

## 2024-06-08 ENCOUNTER — Other Ambulatory Visit: Payer: Self-pay | Admitting: Family Medicine

## 2024-06-08 DIAGNOSIS — Z20828 Contact with and (suspected) exposure to other viral communicable diseases: Secondary | ICD-10-CM

## 2024-07-15 DIAGNOSIS — B078 Other viral warts: Secondary | ICD-10-CM | POA: Diagnosis not present

## 2024-07-15 DIAGNOSIS — Z08 Encounter for follow-up examination after completed treatment for malignant neoplasm: Secondary | ICD-10-CM | POA: Diagnosis not present

## 2024-07-15 DIAGNOSIS — Z85828 Personal history of other malignant neoplasm of skin: Secondary | ICD-10-CM | POA: Diagnosis not present

## 2024-09-01 ENCOUNTER — Other Ambulatory Visit

## 2024-09-01 DIAGNOSIS — K7460 Unspecified cirrhosis of liver: Secondary | ICD-10-CM

## 2024-09-01 DIAGNOSIS — E7800 Pure hypercholesterolemia, unspecified: Secondary | ICD-10-CM

## 2024-09-01 DIAGNOSIS — C61 Malignant neoplasm of prostate: Secondary | ICD-10-CM

## 2024-09-01 DIAGNOSIS — Z0000 Encounter for general adult medical examination without abnormal findings: Secondary | ICD-10-CM

## 2024-09-01 DIAGNOSIS — N1831 Chronic kidney disease, stage 3a: Secondary | ICD-10-CM

## 2024-09-02 ENCOUNTER — Encounter: Payer: Self-pay | Admitting: Family Medicine

## 2024-09-02 ENCOUNTER — Ambulatory Visit: Payer: Self-pay | Admitting: Family Medicine

## 2024-09-02 ENCOUNTER — Ambulatory Visit: Admitting: Family Medicine

## 2024-09-02 VITALS — BP 140/80 | HR 65 | Temp 98.3°F | Ht 75.0 in | Wt 209.0 lb

## 2024-09-02 DIAGNOSIS — Z8546 Personal history of malignant neoplasm of prostate: Secondary | ICD-10-CM | POA: Diagnosis not present

## 2024-09-02 DIAGNOSIS — E7800 Pure hypercholesterolemia, unspecified: Secondary | ICD-10-CM | POA: Diagnosis not present

## 2024-09-02 DIAGNOSIS — N1831 Chronic kidney disease, stage 3a: Secondary | ICD-10-CM

## 2024-09-02 LAB — CBC WITH DIFFERENTIAL/PLATELET
Absolute Lymphocytes: 2690 {cells}/uL (ref 850–3900)
Absolute Monocytes: 466 {cells}/uL (ref 200–950)
Basophils Absolute: 19 {cells}/uL (ref 0–200)
Basophils Relative: 0.3 %
Eosinophils Absolute: 107 {cells}/uL (ref 15–500)
Eosinophils Relative: 1.7 %
HCT: 47.8 % (ref 38.5–50.0)
Hemoglobin: 15.6 g/dL (ref 13.2–17.1)
MCH: 30.5 pg (ref 27.0–33.0)
MCHC: 32.6 g/dL (ref 32.0–36.0)
MCV: 93.5 fL (ref 80.0–100.0)
MPV: 10 fL (ref 7.5–12.5)
Monocytes Relative: 7.4 %
Neutro Abs: 3018 {cells}/uL (ref 1500–7800)
Neutrophils Relative %: 47.9 %
Platelets: 227 Thousand/uL (ref 140–400)
RBC: 5.11 Million/uL (ref 4.20–5.80)
RDW: 12.5 % (ref 11.0–15.0)
Total Lymphocyte: 42.7 %
WBC: 6.3 Thousand/uL (ref 3.8–10.8)

## 2024-09-02 LAB — COMPLETE METABOLIC PANEL WITHOUT GFR
AG Ratio: 1.9 (calc) (ref 1.0–2.5)
ALT: 15 U/L (ref 9–46)
AST: 20 U/L (ref 10–35)
Albumin: 4.3 g/dL (ref 3.6–5.1)
Alkaline phosphatase (APISO): 67 U/L (ref 35–144)
BUN: 19 mg/dL (ref 7–25)
CO2: 27 mmol/L (ref 20–32)
Calcium: 9.3 mg/dL (ref 8.6–10.3)
Chloride: 105 mmol/L (ref 98–110)
Creat: 1.11 mg/dL (ref 0.70–1.28)
Globulin: 2.3 g/dL (ref 1.9–3.7)
Glucose, Bld: 89 mg/dL (ref 65–99)
Potassium: 4.8 mmol/L (ref 3.5–5.3)
Sodium: 140 mmol/L (ref 135–146)
Total Bilirubin: 0.5 mg/dL (ref 0.2–1.2)
Total Protein: 6.6 g/dL (ref 6.1–8.1)

## 2024-09-02 LAB — PSA: PSA: 0.04 ng/mL (ref <=4.00)

## 2024-09-02 LAB — LIPID PANEL
Cholesterol: 146 mg/dL (ref <200)
HDL: 53 mg/dL (ref >=40)
LDL Cholesterol (Calc): 75 mg/dL
Non-HDL Cholesterol (Calc): 93 mg/dL (ref <130)
Total CHOL/HDL Ratio: 2.8 (calc) (ref <5.0)
Triglycerides: 97 mg/dL (ref <150)

## 2024-09-02 MED ORDER — OMEGA-3-ACID ETHYL ESTERS 1 G PO CAPS
2.0000 g | ORAL_CAPSULE | Freq: Every day | ORAL | 3 refills | Status: AC
Start: 2024-09-02 — End: ?

## 2024-09-02 MED ORDER — LORAZEPAM 1 MG PO TABS: ORAL_TABLET | ORAL | 1 refills | Status: AC

## 2024-09-02 NOTE — Progress Notes (Signed)
 Subjective:    Patient ID: Dwayne Young, male    DOB: Oct 16, 1950, 73 y.o.   MRN: [id number]  Patient is here today for his regular checkup.  He states that he is doing great.  He denies any concerns.  His blood pressure today is borderline high for him however his lab work below is outstanding.  His PSA remains undetectable with regards to his history of prostate cancer.  His kidney function remained stable.  His cholesterol is outstanding.  At his last visit we discussed his cholesterol.  His coronary artery calcium  score 3 years ago was 0.  Therefore I do not feel that he likely requires the statin.  Patient however wants to continue the statin at the present time because he is not experiencing any side effects from the medication and he does not see any risk to taking this long-term. Lab on 09/01/2024  Component Date Value Ref Range Status   WBC 09/01/2024 6.3  3.8 - 10.8 Thousand/uL Final   RBC 09/01/2024 5.11  4.20 - 5.80 Million/uL Final   Hemoglobin 09/01/2024 15.6  13.2 - 17.1 g/dL Final   HCT 88/80/7974 47.8  38.5 - 50.0 % Final   MCV 09/01/2024 93.5  80.0 - 100.0 fL Final   MCH 09/01/2024 30.5  27.0 - 33.0 pg Final   MCHC 09/01/2024 32.6  32.0 - 36.0 g/dL Final   Comment: For adults, a slight decrease in the calculated MCHC value (in the range of 30 to 32 g/dL) is most likely not clinically significant; however, it should be interpreted with caution in correlation with other red cell parameters and the patient's clinical condition.    RDW 09/01/2024 12.5  11.0 - 15.0 % Final   Platelets 09/01/2024 227  140 - 400 Thousand/uL Final   MPV 09/01/2024 10.0  7.5 - 12.5 fL Final   Neutro Abs 09/01/2024 3,018  1,500 - 7,800 cells/uL Final   Absolute Lymphocytes 09/01/2024 2,690  850 - 3,900 cells/uL Final   Absolute Monocytes 09/01/2024 466  200 - 950 cells/uL Final   Eosinophils Absolute 09/01/2024 107  15 - 500 cells/uL Final   Basophils Absolute 09/01/2024 19  0 - 200 cells/uL  Final   Neutrophils Relative % 09/01/2024 47.9  % Final   Total Lymphocyte 09/01/2024 42.7  % Final   Monocytes Relative 09/01/2024 7.4  % Final   Eosinophils Relative 09/01/2024 1.7  % Final   Basophils Relative 09/01/2024 0.3  % Final   Cholesterol 09/01/2024 146  <200 mg/dL Final   HDL 88/80/7974 53  > OR = 40 mg/dL Final   Triglycerides 88/80/7974 97  <150 mg/dL Final   LDL Cholesterol (Calc) 09/01/2024 75  mg/dL (calc) Final   Comment: Reference range: <100 . Desirable range <100 mg/dL for primary prevention;   <70 mg/dL for patients with CHD or diabetic patients  with > or = 2 CHD risk factors. SABRA LDL-C is now calculated using the Martin-Hopkins  calculation, which is a validated novel method providing  better accuracy than the Friedewald equation in the  estimation of LDL-C.  Gladis APPLETHWAITE et al. SANDREA. 7986;689(80): 2061-2068  (http://education.[facility].com/faq/FAQ164)    Total CHOL/HDL Ratio 09/01/2024 2.8  <4.9 (calc) Final   Non-HDL Cholesterol (Calc) 09/01/2024 93  <130 mg/dL (calc) Final   Comment: For patients with diabetes plus 1 major ASCVD risk  factor, treating to a non-HDL-C goal of <100 mg/dL  (LDL-C of <29 mg/dL) is considered a therapeutic  option.  Glucose, Bld 09/01/2024 89  65 - 99 mg/dL Final   Comment: .            Fasting reference interval .    BUN 09/01/2024 19  7 - 25 mg/dL Final   Creat 88/80/7974 1.11  0.70 - 1.28 mg/dL Final   BUN/Creatinine Ratio 09/01/2024 SEE NOTE:  6 - 22 (calc) Final   Comment:    Not Reported: BUN and Creatinine are within    reference range. .    Sodium 09/01/2024 140  135 - 146 mmol/L Final   Potassium 09/01/2024 4.8  3.5 - 5.3 mmol/L Final   Chloride 09/01/2024 105  98 - 110 mmol/L Final   CO2 09/01/2024 27  20 - 32 mmol/L Final   Calcium  09/01/2024 9.3  8.6 - 10.3 mg/dL Final   Total Protein 88/80/7974 6.6  6.1 - 8.1 g/dL Final   Albumin 88/80/7974 4.3  3.6 - 5.1 g/dL Final   Globulin 88/80/7974 2.3  1.9  - 3.7 g/dL (calc) Final   AG Ratio 09/01/2024 1.9  1.0 - 2.5 (calc) Final   Total Bilirubin 09/01/2024 0.5  0.2 - 1.2 mg/dL Final   Alkaline phosphatase (APISO) 09/01/2024 67  35 - 144 U/L Final   AST 09/01/2024 20  10 - 35 U/L Final   ALT 09/01/2024 15  9 - 46 U/L Final   PSA 09/01/2024 0.04  < OR = 4.00 ng/mL Final   Comment: The total PSA value from this assay system is  standardized against the WHO standard. The test  result will be approximately 20% lower when compared  to the equimolar-standardized total PSA (Beckman  Coulter). Comparison of serial PSA results should be  interpreted with this fact in mind. . This test was performed using the Siemens  chemiluminescent method. Values obtained from  different assay methods cannot be used interchangeably. PSA levels, regardless of value, should not be interpreted as absolute evidence of the presence or absence of disease.     Past Medical History:  Diagnosis Date   Arthritis    arthritis-generalized-knees, elbows   Elevated PSA    Hypercholesteremia    Hyperlipidemia    Prostate cancer (HCC)    dx. prostate cancer after bx. 2'15-now surgery planned   Retinal detachment    Seasonal allergies    Past Surgical History:  Procedure Laterality Date   COLONOSCOPY N/A 04/27/2013   Procedure: COLONOSCOPY;  Surgeon: Oneil DELENA Budge, MD;  Location: [facility];  Service: Gastroenterology;  Laterality: N/A;   COLONOSCOPY WITH PROPOFOL  N/A 10/21/2023   Procedure: COLONOSCOPY WITH PROPOFOL ;  Surgeon: Budge Oneil, MD;  Location: [facility];  Service: Gastroenterology;  Laterality: N/A;   GANGLION CYST EXCISION Right    KNEE ARTHROSCOPY Bilateral    open procedure   LYMPHADENECTOMY Bilateral 03/03/2014   Procedure: LYMPHADENECTOMY;  Surgeon: Gretel Ferrara, MD;  Location: [facility];  Service: Urology;  Laterality: Bilateral;   NASAL SEPTOPLASTY W/ TURBINOPLASTY     PROSTATE SURGERY N/A    [employer] 07/08/2020   ROBOT ASSISTED  LAPAROSCOPIC RADICAL PROSTATECTOMY N/A 03/03/2014   Procedure: ROBOTIC ASSISTED LAPAROSCOPIC RADICAL PROSTATECTOMY LEVEL 2;  Surgeon: Gretel Ferrara, MD;  Location: [facility];  Service: Urology;  Laterality: N/A;   TONSILLECTOMY     child   TOOTH EXTRACTION     preparing for tooth implant   VASECTOMY N/A    [employer] 07/08/2020   Current Outpatient Medications on File Prior to Visit  Medication Sig Dispense Refill  diclofenac  Sodium (VOLTAREN ) 1 % GEL Apply 1 application topically daily as needed (pain).     rosuvastatin  (CRESTOR ) 10 MG tablet Take 1 tablet (10 mg total) by mouth daily. 90 tablet 3   No current facility-administered medications on file prior to visit.   No Known Allergies    Review of Systems     Objective:   Physical Exam Constitutional:      Appearance: Normal appearance. He is normal weight.  Cardiovascular:     Rate and Rhythm: Normal rate and regular rhythm.     Heart sounds: Normal heart sounds. No murmur heard.    No friction rub. No gallop.  Pulmonary:     Effort: Pulmonary effort is normal. No respiratory distress.     Breath sounds: Normal breath sounds. No stridor. No wheezing, rhonchi or rales.  Abdominal:     General: Abdomen is flat. Bowel sounds are normal. There is no distension.     Palpations: Abdomen is soft.     Tenderness: There is no abdominal tenderness. There is no guarding or rebound.  Musculoskeletal:     Right lower leg: No edema.     Left lower leg: No edema.  Neurological:     General: No focal deficit present.     Mental Status: He is alert and oriented to person, place, and time. Mental status is at baseline.     Cranial Nerves: No cranial nerve deficit.     Motor: No weakness.     Gait: Gait normal.           Assessment & Plan:  Stage 3a chronic kidney disease (HCC)  Pure hypercholesterolemia  History of prostate cancer PSA remains undetectable.  Therefore his prostate cancer appears to be completely in remission  and appropriately treated.  Continue to monitor this every 6 months.  Patient's kidney disease is stable and actually slightly improved.  Continue to monitor his blood pressure.  If consistently greater than 140/80, I would more aggressively treat his blood pressure.  Patient's cholesterol is outstanding.  He chooses to continue Crestor  at the present time as he suffers no side effects from the medication and he wants to minimize any long-term cardiovascular risk.  He received his flu shot and his shingles shot from another facility.

## 2024-09-03 ENCOUNTER — Ambulatory Visit: Admitting: Family Medicine
